# Patient Record
Sex: Male | Born: 1965
Health system: Southern US, Community
[De-identification: ages and names within clinical notes are randomized; demographics above are authoritative.]

## PROBLEM LIST (undated history)

## (undated) DIAGNOSIS — K4090 Unilateral inguinal hernia, without obstruction or gangrene, not specified as recurrent: Secondary | ICD-10-CM

## (undated) DIAGNOSIS — K219 Gastro-esophageal reflux disease without esophagitis: Secondary | ICD-10-CM

## (undated) DIAGNOSIS — G8929 Other chronic pain: Secondary | ICD-10-CM

## (undated) DIAGNOSIS — F1111 Opioid abuse, in remission: Secondary | ICD-10-CM

## (undated) DIAGNOSIS — Z8619 Personal history of other infectious and parasitic diseases: Secondary | ICD-10-CM

## (undated) DIAGNOSIS — J449 Chronic obstructive pulmonary disease, unspecified: Secondary | ICD-10-CM

## (undated) DIAGNOSIS — G4733 Obstructive sleep apnea (adult) (pediatric): Secondary | ICD-10-CM

## (undated) HISTORY — PX: CARDIAC CATHETERIZATION: SHX172

---

## 1997-08-04 ENCOUNTER — Emergency Department (HOSPITAL_COMMUNITY): Admission: EM | Admit: 1997-08-04 | Discharge: 1997-08-04 | Payer: Self-pay | Admitting: Emergency Medicine

## 1997-08-15 ENCOUNTER — Emergency Department (HOSPITAL_COMMUNITY): Admission: EM | Admit: 1997-08-15 | Discharge: 1997-08-15 | Payer: Self-pay | Admitting: Emergency Medicine

## 1997-09-01 ENCOUNTER — Emergency Department (HOSPITAL_COMMUNITY): Admission: EM | Admit: 1997-09-01 | Discharge: 1997-09-01 | Payer: Self-pay | Admitting: Emergency Medicine

## 1997-09-06 ENCOUNTER — Emergency Department (HOSPITAL_COMMUNITY): Admission: EM | Admit: 1997-09-06 | Discharge: 1997-09-06 | Payer: Self-pay | Admitting: Emergency Medicine

## 1997-10-31 ENCOUNTER — Emergency Department (HOSPITAL_COMMUNITY): Admission: EM | Admit: 1997-10-31 | Discharge: 1997-10-31 | Payer: Self-pay | Admitting: Emergency Medicine

## 1998-06-29 ENCOUNTER — Emergency Department (HOSPITAL_COMMUNITY): Admission: EM | Admit: 1998-06-29 | Discharge: 1998-06-29 | Payer: Self-pay | Admitting: Emergency Medicine

## 1999-11-02 ENCOUNTER — Emergency Department (HOSPITAL_COMMUNITY): Admission: EM | Admit: 1999-11-02 | Discharge: 1999-11-02 | Payer: Self-pay | Admitting: Emergency Medicine

## 2000-06-27 ENCOUNTER — Encounter: Admission: RE | Admit: 2000-06-27 | Discharge: 2000-09-25 | Payer: Self-pay | Admitting: Anesthesiology

## 2002-01-31 ENCOUNTER — Emergency Department (HOSPITAL_COMMUNITY): Admission: EM | Admit: 2002-01-31 | Discharge: 2002-01-31 | Payer: Self-pay | Admitting: Emergency Medicine

## 2003-06-05 ENCOUNTER — Emergency Department (HOSPITAL_COMMUNITY): Admission: EM | Admit: 2003-06-05 | Discharge: 2003-06-05 | Payer: Self-pay | Admitting: Emergency Medicine

## 2005-02-19 ENCOUNTER — Emergency Department (HOSPITAL_COMMUNITY): Admission: EM | Admit: 2005-02-19 | Discharge: 2005-02-19 | Payer: Self-pay

## 2005-04-19 ENCOUNTER — Emergency Department (HOSPITAL_COMMUNITY): Admission: EM | Admit: 2005-04-19 | Discharge: 2005-04-19 | Payer: Self-pay | Admitting: Emergency Medicine

## 2006-08-05 ENCOUNTER — Ambulatory Visit: Payer: Self-pay | Admitting: Cardiology

## 2006-08-05 ENCOUNTER — Inpatient Hospital Stay (HOSPITAL_COMMUNITY): Admission: EM | Admit: 2006-08-05 | Discharge: 2006-08-11 | Payer: Self-pay | Admitting: Emergency Medicine

## 2006-08-07 ENCOUNTER — Encounter (INDEPENDENT_AMBULATORY_CARE_PROVIDER_SITE_OTHER): Payer: Self-pay | Admitting: Radiation Oncology

## 2006-08-07 HISTORY — PX: TRANSTHORACIC ECHOCARDIOGRAM: SHX275

## 2007-11-29 ENCOUNTER — Emergency Department (HOSPITAL_COMMUNITY): Admission: EM | Admit: 2007-11-29 | Discharge: 2007-11-29 | Payer: Self-pay | Admitting: Family Medicine

## 2008-01-08 ENCOUNTER — Emergency Department (HOSPITAL_COMMUNITY): Admission: EM | Admit: 2008-01-08 | Discharge: 2008-01-08 | Payer: Self-pay | Admitting: Emergency Medicine

## 2009-03-06 ENCOUNTER — Emergency Department (HOSPITAL_COMMUNITY): Admission: EM | Admit: 2009-03-06 | Discharge: 2009-03-06 | Payer: Self-pay | Admitting: Family Medicine

## 2010-07-17 NOTE — Cardiovascular Report (Signed)
NAME:  Clarence Murray, Clarence Murray NO.:  [id number]   MEDICAL RECORD NO.:  [id number]          PATIENT TYPE:  OUT   LOCATION:  CATH                         FACILITY:  [facility]   PHYSICIAN:  Veverly Fells. Excell Seltzer, MD  DATE OF BIRTH:  07/24/1965   DATE OF PROCEDURE:  08/11/2006  DATE OF DISCHARGE:                            CARDIAC CATHETERIZATION   PROCEDURE:  Left heart catheterization, selective coronary angiography,  left ventricular angiography, and StarClose of the right femoral artery.   INDICATIONS:  Clarence Murray is a 45 year old gentleman with chest pain and  multiple cardiovascular risk factors.  He has had intermittent pain  throughout his hospitalization and is referred for cardiac  catheterization in the setting of his high risk for coronary artery  disease.   Risks and indications of the procedure were explained to the patient in  detail.  Informed consent was obtained.  The right groin was prepped,  draped and anesthetized with 1% lidocaine.  Using the modified Seldinger  technique, a 6-French sheath was placed in the right femoral artery.  Multiple views of the left and right coronary arteries were taken using  standard Judkins catheters.  Following selective coronary angiography,  an angled pigtail catheter was inserted into the left ventricle and  pressures were recorded.  A left ventriculogram was performed.  A  pullback across the aortic valve was done.  At the conclusion of the  procedure the arteriotomy was sealed with a StarClose device.   FINDINGS:  Aortic pressure 116/73 with a mean of 92, left ventricular  pressure 116/7 with an end-diastolic pressure of 9.   The left mainstem is angiographically normal.  It bifurcates into the  LAD and left circumflex.   The LAD is a large-caliber vessel that courses down and reaches the left  ventricular apex.  The vessel actually wraps around the LV apex and  supplies the distal portion of the inferoapex.  The LAD in  its proximal  portion has nonobstructive plaque in the range of 20-30%.  The mid  portion of the vessel gives off a first diagonal branch that is medium  size.  The remaining portions of the mid and distal LAD have no  significant angiographic disease.   The left circumflex is a large-caliber vessel.  It is a dominant vessel,  providing a left PDA.  It provides a large first OM branch.  It then  courses down and provides a twin left PDA system as well as a left  posterolateral branch.  The proximal portion of the left circumflex has  20-30% irregular stenosis that is clearly nonobstructive.  There is no  other angiographic disease seen in the left circumflex system.   The right coronary artery is small and nondominant.  There is no  significant angiographic disease.   Left ventricular function assessed with 30-degree right anterior oblique  left ventriculography demonstrates normal LVEF with no regional wall  motion abnormalities.  The LVEF is estimated at 65%.  There is no mitral  regurgitation.   ASSESSMENT:  1. Nonobstructive coronary artery disease.  2. Minor nonobstructive plaque in the left  anterior descending artery      and left circumflex.  3. Normal right coronary artery (nondominant vessel).  4. Normal left ventricular function.   PLAN:  Recommend medical therapy for the patient's nonobstructive  coronary artery disease.  His LDL goal should be less than 100 and  smoking cessation will be advised.      Veverly Fells. Excell Seltzer, MD  Electronically Signed     MDC/MEDQ  D:  08/11/2006  T:  08/11/2006  Job:  [id number]

## 2010-07-17 NOTE — H&P (Signed)
NAME:  PARISHJadd, Gasior NO.:  [id number]   MEDICAL RECORD NO.:  [id number]          PATIENT TYPE:  EMS   LOCATION:  ED                           FACILITY:  [facility]   PHYSICIAN:  Hillery Aldo, M.D.   DATE OF BIRTH:  03/24/1965   DATE OF ADMISSION:  08/05/2006  DATE OF DISCHARGE:                              HISTORY & PHYSICAL   PRIMARY CARE PHYSICIAN:  None.   CHIEF COMPLAINT:  Progressive dyspnea and worsening flu symptoms.   HISTORY OF PRESENT ILLNESS:  The patient is a 45 year old male with past  medical history of tobacco abuse who presents with a 5-day history of  flu-like symptoms.  The patient states that his symptoms began with  fever, congestion, cough, and he has experienced progressive dyspnea  since becoming ill.  His cough is not productive of yellow to green  sputum.  His appetite is diminished.  He has fever interspersed with  episodes of diaphoresis.  He has not had any sick contacts that he knows  of.  On initial evaluation in the emergency department, he is found to  be hypoxic and is therefore being admitted for further evaluation and  treatment.   PAST MEDICAL HISTORY:  1. Herniated nucleus pulposus C5-C6 status post cervical steroid      injections.  2. Traumatic injury to the left zygomatic arches at age 38, status      post surgical repair.   FAMILY HISTORY:  The patient's father died at 32 from a massive heart  attack.  The patient's mother died at 38 from brain cancer.  He has one  brother who is deceased from a motor vehicle accident and another sister  who died at age 80 from acute MI.  One brother is alive but has lung  disease.   SOCIAL HISTORY:  The patient is married and lives with his wife in  [place].  He has a history of heavy tobacco use, up to two packs per  day, quit approximately two weeks ago.  Denies any alcohol or drug use  but has used drugs in the past.  He is currently on methadone  maintenance through [facility], and  has been on methadone for the past 10  years.  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   ALLERGIES:  None.   CURRENT MEDICATIONS:  Methadone 105 mg daily.   REVIEW OF SYSTEMS:  As noted in the elements of the HPI above.  The  patient denies any nausea or vomiting.  He has had some pleuritic chest  pain intermittently.  No changes in his bowel habits, no melena or  hematochezia.  No dysuria.   PHYSICAL EXAMINATION:  VITAL SIGNS:  Temperature 101.4, pulse 89,  respirations 22, blood pressure 120/72, O2 saturation 91% on 2 liters.  GENERAL:  This is a well-developed, well-nourished male who is in mild  respiratory distress.  HEENT:  Normocephalic, atraumatic.  PERRL.  EOMI.  Oropharynx reveals tonsillar exudates and his oropharynx reveals  extremely poor dentition.  NECK:  Supple, no thyromegaly, no lymphadenopathy, no jugular venous  distension.  CHEST:  Diffuse expiratory  wheezes throughout.  HEART:  Regular rate, rhythm.  No murmurs, rubs, gallops.  ABDOMEN:  Soft, nontender, nondistended with normoactive bowel sounds.  EXTREMITIES:  No clubbing, edema, cyanosis.  SKIN:  Warm and dry.  No rashes.  NEUROLOGIC:  Nonfocal.   DATA REVIEW.:  Chest x-ray reveals chronic interstitial lung disease  without acute cardiopulmonary findings.   No laboratory data was obtained by the ED physicians.  I have ordered a  CBC, basic metabolic panel and arterial blood gas.   ASSESSMENT/PLAN:  1. Bronchitis with bronchospasm:  We will admit the patient for IV      antibiotic therapy.  I will obtain cultures of his sputum and      blood.  Will start him on IV Avelox and administer Solu-Medrol for      his bronchospasm as well as nebulized bronchodilator therapy.  Will      administer Mucinex and Robitussin DM for ankylosis.  We will taper      steroids as tolerated.  2. Methadone maintenance:  Continue the patient's usual dose of      methadone.  3. Prophylaxis:  Will initiate GI and DVT prophylaxis  with Protonix      and Lovenox.      Hillery Aldo, M.D.  Electronically Signed     CR/MEDQ  D:  08/05/2006  T:  08/05/2006  Job:  [id number]

## 2010-07-17 NOTE — Consult Note (Signed)
NAME:  PARISHJacquese, Murray NO.:  [id number]   MEDICAL RECORD NO.:  [id number]          PATIENT TYPE:  INP   LOCATION:  1438                         [facility]AC[facility]:  [facility]   PHYSICIAN:  Arturo Morton. Riley Kill, MD, FACCDATE OF BIRTH:  [birth year]   DATE OF CONSULTATION:  08/06/2006  DATE OF DISCHARGE:                                 CONSULTATION   CHIEF COMPLAINT:  Chest pain.   HISTORY OF PRESENT ILLNESS:  This gentleman is a very pleasant 45-year-  old gentleman who presents with a past medical history of heavy tobacco  use.  He presents with a 5-day history of flu-like symptoms.  He began  with the fever, cough and congestion and he has had increasing dyspnea.  His cough has not been particularly productive.  In the emergency room  he was found to have hypoxemia, and also known to have temperature.  The  patient's brother has a history of rather marked bronchitis and COPD and  I spoke with him directly.   PAST MEDICAL HISTORY:  The patient has had a herniated nucleus pulposus  at C5-C6 with cervical steroid injections.  He had traumatic injury to  the left zygomatic arches at age 14 and has had repair.  The patient  also takes methadone.   ALLERGIES:  HE HAS NO ALLERGIES.   MEDICATIONS:  Include methadone daily.   FAMILY HISTORY:  The patient's father died at 76 of a heart attack.  His  mother died at 50 from brain cancer.  He has a brother who died in a  motor vehicle accident, a sister who died with an acute MI at 15.  His  brother has had cardiac catheterization, and this does not demonstrate  critical disease according to the brother.  He does have COPD.   SOCIAL HISTORY:  The patient is married and lives with his wife in  [place].  He has smoked heavily for a long time but quit a couple of  weeks ago.  He denies alcohol and drug use but apparently used drugs in  the past.  He is currently on a methadone maintenance through [facility] and  has been on methadone for the  past 10 years.  He works part-time in  delivery.   REVIEW OF SYSTEMS:  The patient has had some chest discomfort associated  with exertion, but has also had chest pain at rest.  There has been a  somewhat progressive pattern.  He denies nausea or vomiting.  He has not  had blood in the stools.  He has not had significant weight gain or  loss.  He has not had specific tenderness in the chest.   PHYSICAL EXAMINATION:  He is alert and oriented.  His temperature is  98.1.  The pulse is 80.  The respiratory rate is 20 and the blood  pressure 118/60.  He appears slightly diaphoretic.  HEENT:  The pupils are equal and reactive to light.  The sclerae are  clear.  NECK:  The carotid upstrokes are brisk. There are no obvious carotid  bruits.  LUNGS:  Clear to percussion, but  on auscultation there is fairly marked  decrease in breath sounds overall with prolonged expiration and slight  wheezes.  The PMI is nondisplaced and the heart sounds are somewhat  distant with normal first and second heart sounds.  No murmurs, rubs or  gallops.  EXTREMITIES:  Reveal no edema.  NEUROLOGIC:  Reveals no acute findings.   Chest x-ray reveals clear lung fields without focal infiltrates or  edema.  There are coarse interstitial markings and infrahilar  prominence.  Some scar is seen at the left lung base.  There is mild  compression deformity of two mid thoracic vertebral bodies.  Previous x-  rays revealed peribronchial thickening and diffuse interstitial disease.   IMPRESSION:  1. Acute bronchitis with fever and hypoxemia.  2. Longstanding tobacco use.  3. Leukocytosis.  4. Multiple risk factors for coronary artery disease with strong      positive family history, smoking and very poor dentition.  5. History of hypokalemia, now replaced, question etiology.   RECOMMENDATIONS:  1. I would recommend that the patient have a two-dimensional      echocardiogram.  2. I have had an extensive discussion with  his family.  I have      encouraged him to have his teeth taking care of.  His wife has      attempted to do this.  I have strongly encouraged him to stop      smoking.  With regard to his current symptoms and most bothered by      the progressive nature of the symptoms over the last year and the      strong family history.  Importantly, the patient is not a very good      candidate presently for exercise tolerance testing.  He has an      extensive cough.   I would likely recommend doing a 2-D echo to assess LV function and  considering cardiac catheterization just before discharge.  We will  follow the patient with you.  I appreciate the consultation.      Arturo Morton. Riley Kill, MD, [facility]  Electronically Signed     TDS/MEDQ  D:  08/06/2006  T:  08/07/2006  Job:  [id number]

## 2010-07-20 NOTE — H&P (Signed)
[facility]  Patient:    Clarence Murray, Clarence Murray                      MRN: [id number] Adm. Date:  [id number] Attending:  Thyra Breed CC:         Jearld Adjutant, M.D.   History and Physical  FOLLOWUP EVALUATION:  [name] called earlier today saying that he is worried he has an infection from his cervical epidural steroid injection site.  He is seen today.  He describes no recent fevers or sweats.  He states that he was doing well following the injection, which was on Jul 08, 2000, and did well up until yesterday, when he began to have a bit of discomfort at the base of the spine.  Today, he is exquisitely tender over the base of the spine above the area where the injection site is.  He denied any fever or sweats.  he denies any new neurologic symptoms.  PHYSICAL EXAMINATION:  VITAL SIGNS:  Blood pressure 153/89, heart rate is 83, respiratory rate is 18, O2 saturation is 97%, pain level is 10, according to him, and temperature is 97.4.  NECK:  He exhibits no erythema at the injection site.  No lymphadenopathy of the neck.  NEUROLOGIC:  His neuro exam is grossly unchanged from his last visit.  He is tender above the injection site over the spinous process of C6 and to the left.  IMPRESSION:  Neck discomfort which may or may note be related to the injection, since it is occurring nine days afterwards, which temporally seems unlikely.  There is no evidence of infection grossly on physical exam and no evidence of fever.  DISPOSITION: 1. Stop hydrocodone and take Percocet 1 to 1 p.o. q.6h. p.r.n., #100 with no    refill. 2. He tells me he say Dr. Jearld Adjutant yesterday and was examined and is in    the process of being reevaluated for possible surgical intervention with    Dr. [name]. [name].  I encouraged him to follow up closely with him.  He    was encouraged to call should he develop any evidence of infection.  At    this point, it looks like he has  increased neck discomfort which may be    related to underlying exacerbation of his existing disease.  It is nine    days since he had the injection and he did well following the injection    until recently.  I advised him it would be unusual for an infection to be    so long afterwards but it is not entirely out of the realm so we should    keep an eye just to make sure that nothing pops up and he promised he would    call if he developed any problems further along the way. DD:  07/17/00 TD:  07/18/00 Job: [id number]

## 2010-07-20 NOTE — Op Note (Signed)
[facility]  Patient:    Clarence Murray, Clarence Murray                      MRN: [id number] Proc. Date: 06/30/00 Adm. Date:  [id number] Attending:  Thyra Murray CC:         Clarence Murray, M.D.  Workers Comp   Operative Report  PROCEDURE:  Cervical epidural steroid injection.  DIAGNOSIS:  Annular tear of the C5-6 disk with degenerative changes with history of work-related injury in February 2002.  ANESTHESIOLOGIST:  Clarence Murray, M.D.  HISTORY: Clarence Murray is a very pleasant 45 year old who was sent to [place] by Dr. Renae Murray for a cervical epidural steroid injection.  The patient was in his usual state of health up until a work-related injury in early February.  He estimates it to be April 05, 2000, when he felt a pop in his neck.  He was initially seen by Dr. Louanna Murray and treated with Vioxx and Zanaflex which resulted in mild improvement, and he sent him to Dr. Cheral Murray, a chiropractor, who did chiropractic treatments.  He did not improved, and an MRI was obtained on May 11, 2000, which demonstrated C5-6 degeneration of the disk to a mild extend with protrusion more prominent on the left and a small annular tear. He was seen by Dr. Renae Murray on June 06, 2000, at which time he was continued on Zanaflex, Vioxx, and given Vicodin as well as a steroid dosepak for six days. He noted mild improvement with the dosepak.  He was also placed on Zyban and has been able to cut his cigarette consumption in half.  Currently he complains of a deep stinging pain in his neck which radiates predominantly out to the right upper extremity with associated numbness and tingling intermittently down to the second to fifth fingers which is brought on especially at night with certain movements of the head.  It is increased by cough, lifting objects, and improved by pulling the neck up.  He has not had cervical traction, although he has discussed this with Dr. Renae Murray and Dr. Cheral Murray.  He  denies weakness or bowel or bladder incontinence.  CURRENT MEDICATIONS:  Zyban, Zanaflex 2 mg twice a day, hydrocodone up to 4 tablets a day, and Vioxx which causes some nausea.  ALLERGIES:  No known drug allergies.  FAMILY HISTORY:  Positive for coronary artery disease, hypertension, asthma, and COPD.  PAST MEDICAL HISTORY:  Significant for history of Rocky [place] spotted fever at age 57 and dorsal kyphosis since teenage years.  PAST SURGICAL HISTORY:  The patient has had his wisdom teeth extracted, otherwise negative.  SOCIAL HISTORY:  The patient works at [employer] as a Curator.  He smokes one pack of cigarettes per day at present.  He does not drink alcohol.  REVIEW OF SYSTEMS:  General: Significant for sweats but no changes in weight or fevers.   Head: Significant for left-sided headaches which date back three to four months which he described as more like a migraine-type headache. Eyes: Negative.  Nose, Mouth, Throat: Negative.  Ears: Significant for perforated ear drum on the left side.  Cardiovascular: negative.  Pulmonary: Negative.  GI: Significant for indigestion with certain foods.  GU: Negative. Musculoskeletal and Neurologic : See HPI.  No history of seizure or stroke. Cutaneous:  Negative. Hematologic: Negative.  Endocrine: Negative. Psychiatric: Positive for anxiety for which he was taking medicine up to two years ago.  His mother has schizophrenia, and he  was raised by his grandmother.   Allergy and Immunologic: Negative.  PHYSICAL EXAMINATION:  VITAL SIGNS:  Blood pressure 122/70, heart rate 77, respiratory rate 20, O2 saturation 95%, temperature 97.2.  GENERAL:  This is a pleasant, anxious male in no acute distress. HEENT:  Head was normocephalic, atraumatic.  Eyes: Extraocular movements intact with conjunctivae and sclerae clear.  Nose: Septal deviation to the left with patent nares.  Oropharynx free of lesions. NECK:  Demonstrated intact extension,  forward flexion to about 20 to 30 degrees, lateral flexion to the right to about 30 degrees, to the left about 30 to 40 degrees.  Rotation was relatively intact.  He had negative Spurlings signs.  Carotids were 2+ and symmetric without bruits.  There was no lymphadenopathy.  HEART:  Regular rate and rhythm.  LUNGS:  Clear.  He did have accentuated dorsal kyphosis.  ABDOMEN/GENITALIA/RECTAL:  Exams not performed.  BACK:  Kyphosis from the dorsal spine with negative straight leg raise signs.  EXTREMITIES:  No cyanosis, clubbing, or edema.  Radial pulses and dorsalis pedis pulses 2+ and symmetric.  He did have curved second toes of his feet bilaterally.  NEUROLOGIC:  The patient is oriented x 4.  Cranial nerves II-XII grossly intact.  Deep tendon reflexes were symmetric in the upper and lower extremities with downgoing toes.  Motor was 5/5 with symmetric bulk and tone. Sensory was intact to vibratory sense and pinprick. Coordination was grossly intact.  IMPRESSION: 1. Neck pain with intermittent radiation to right upper extremity with    associated numbness and tingling with underlying annular tear at C5-6. 2. Dorsal kyphosis which is probably Scheuermanns disease. 3. 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 4. Mild indigestion.  DISPOSITION:  I discussed with the patient and his wife the potential risks, benefits, and limitations of an epidural steroid injection.   Their questions were answered.  PROCEDURE:  After informed consent was obtained, the patient was placed in the sitting position with his neck flexed forward.  I identified the C7-T1 interspace and marked this area.  The area was prepped with Betadine x 3.  I anesthetized the interspace with 1% lidocaine using a 25-gauge needle.  A 20-gauge Tuohy needle was introduced to the cervical epidural space to loss of  resistance to preservative free normal saline.  There was no CSF nor blood. Medrol 40 mg and 3 ml  preservative free normal saline was gently injected. The needle was flushed with preservative free normal saline and removed intact.  POSTPROCEDURE CONDITION:  Stable.  DISCHARGE INSTRUCTIONS: 1. Resume previous diet. 2. Limitation of activities per instruction sheet. 3. Continue on current medications. 4. The patient was advised that he should contact us if he has a partial    response to the injection within a week, and we would consider repeating    the injection.  If he has a good response, he does not need any further    injections for at least six months.  If he has no response, there is really    no need for a second injection. DD:  06/30/00 TD:  06/30/00 Job: [id number]

## 2010-07-20 NOTE — Procedures (Signed)
[facility]  Patient:    Clarence Murray, Clarence Murray                      MRN: [id number] Proc. Date: 07/08/00 Adm. Date:  [id number] Attending:  Thyra Breed CC:         Jearld Adjutant, M.D.  Workers Compensation   Procedure Report  PROCEDURE:  Cervical epidural steroid injection.  DIAGNOSIS:  Annular tear at C5-6 on the basis of a work-related injury.  INTERVAL HISTORY:  The patient has noted initial marked improvement after his injection.  Unfortunately, by about day four after the injection, he woke up, and he noted that he had left-sided neck discomfort which has persisted.  He has no new neurologic symptoms other than the neck pain.  He described as a burning-type discomfort which is fairly well localized with some localized sense of numbness over the skin.  He has not had any fevers, chills, or sweats.  PHYSICAL EXAMINATION:  Blood pressure 114/62, heart rate 74, respiratory rate 18, O2 saturations 95%.  Temperature is 97.5.  Pain level is 7/10.  He shows good healing from his previous injection site.  He is actually tender above the injection area.  Deep tendon reflexes are symmetric in the upper extremities.  Motor is 5/5.  He has a tendency to hold his left shoulder higher than his right.  DESCRIPTION OF PROCEDURE:  After informed consent was obtained, the patient was placed in the sitting position with his neck flexed forward.  I identified the C7-T1 interspace and marked the area.  The area was prepped with Betadine x 3 and draped.  I anesthetized the interspace with 1% lidocaine using a 25 gauge needle.  A 20 gauge Tuohy needle was introduced to the cervical epidural space to loss of resistance to preservative-free normal saline.  There was no CSF nor blood.  Medrol 40 mg in 3 mL of preservative-free normal saline was gently injected.  The needle was flushed with preservative-free normal saline and removed intact.  Postprocedure condition -  stable.  DISCHARGE INSTRUCTIONS: 1. Resume previous diet. 2. Limitations on activities per instruction sheet, as outlined by my    assistant today.  His wife is his driver. 3. Continue on current medications. 4. I advised the patient that it is unlikely that he will need another    injection, as if this one does not make any significant improvement, there    is no need for further injections. DD:  07/08/00 TD:  07/08/00 Job: [id number]

## 2010-07-20 NOTE — Discharge Summary (Signed)
NAME:  Clarence Murray, Clarence Murray NO.:  [id number]   MEDICAL RECORD NO.:  [id number]          PATIENT TYPE:  INP   LOCATION:  1438                         FACILITY:  [facility]   PHYSICIAN:  Lonia Blood, M.D.       DATE OF BIRTH:  03-01-1966   DATE OF ADMISSION:  08/04/2006  DATE OF DISCHARGE:  08/11/2006                               DISCHARGE SUMMARY   DISCHARGE DIAGNOSIS:  1. Chronic obstructive pulmonary disease with acute exacerbation.  2. Chest pain status post cardiac catheterization negative for      coronary artery disease.  3. Hypokalemia resolved.  4. Chronic pain syndrome on methadone.  5. Recent tobacco abuse.  6. Acute bronchitis.  7. Hypoxia - resolved.   DISCHARGE MEDICATIONS:  1. Combivent 1 puff three times a day.  2. Prednisone 20 mg taper.  3. Methadone 105 mg as before.  4. Aspirin 81 mg daily.  5. Doxycycline 100 mg daily for 5 days.   CONDITION ON DISCHARGE:  The patient was discharged in good condition.  At the time of discharge the patient was afebrile, with stable vital  signs. His oxygenation has improved.  The patient was instructed to  follow up with [facility] and to call [phone] for an  eligibility appointment.  Also the patient will follow up with Dr.  Excell Seltzer from cardiology as needed.   PROCEDURES DURING THIS ADMISSION:  1. On August 11, 2006 the patient underwent transthoracic echocardiogram      findings of left ventricular systolic function normal 60%.  2. August 11, 2006 coronary angiogram, left heart angiogram with left      heart cardiac catheterization, findings of normal coronary      arteries.  3. August 08, 2006 pulmonary function test with findings of severe      obstructive lung defect with mild restrictive lung defect, mild      response to bronchodilator, measured FEV-1 57%, measure FEV-1/FVC      31%, DLCO was 41%, vital capacity 57%.   CONSULTATION:  During this admission the patient was seen in  consultation  by the [facility].   HISTORY AND PHYSICAL:  For admission history and physical refer to  dictated H&P done by Dr. Hillery Aldo.   HOSPITAL COURSE.:  Problem 1.  Hypoxia and wheezing. The patient was  admitted with an episode of probable interstitial pneumonia that was  causing a COPD exacerbation.  The patient was started on high-dose  steroids, nebulizer treatments and oxygen.  He had a course of  progressive improvement.  Gradually steroids were weaned off towards  oral prednisone and the patient's nebulizer were switched to MDIs.  The  patient was extensively educated about the importance of smoking  cessation and to have outpatient follow-up.   Problem 2.  Chest pain.  [name] started complaining of chest pain  during this admission.  He also related that at times he had episodes of  true angina at home.  He was seen in consultation by Dr. Riley Kill who  recommended a cardiac catheterization.  [name] cardiac  catheterization  was within normal limits.  The patient was instructed to  continue smoking cessation and to engage in a healthy lifestyle, to  increase his exercise ability as his lung condition will allow.      Lonia Blood, M.D.  Electronically Signed     SL/MEDQ  D:  08/20/2006  T:  08/20/2006  Job:  [id number]

## 2010-12-04 LAB — POCT I-STAT, CHEM 8
Calcium, Ion: 1.14 mmol/L (ref 1.12–1.32)
Chloride: 99 mEq/L (ref 96–112)
Glucose, Bld: 101 mg/dL — ABNORMAL HIGH (ref 70–99)
HCT: 47 % (ref 39.0–52.0)
TCO2: 31 mmol/L (ref 0–100)

## 2010-12-04 LAB — CBC
HCT: 43.7 % (ref 39.0–52.0)
Hemoglobin: 14.7 g/dL (ref 13.0–17.0)
MCHC: 33.6 g/dL (ref 30.0–36.0)
MCV: 95.1 fL (ref 78.0–100.0)
Platelets: 267 10*3/uL (ref 150–400)
RBC: 4.6 MIL/uL (ref 4.22–5.81)
RDW: 13.8 % (ref 11.5–15.5)
WBC: 6.2 10*3/uL (ref 4.0–10.5)

## 2010-12-04 LAB — POCT URINALYSIS DIP (DEVICE)
Glucose, UA: NEGATIVE mg/dL
Hgb urine dipstick: NEGATIVE
Nitrite: NEGATIVE
Operator id: 239701
Protein, ur: 100 mg/dL — AB
Specific Gravity, Urine: >=1.03 (ref 1.005–1.030)
Urobilinogen, UA: 1 mg/dL (ref 0.0–1.0)
pH: 5.5 (ref 5.0–8.0)

## 2010-12-04 LAB — DIFFERENTIAL
Basophils Absolute: 0 10*3/uL (ref 0.0–0.1)
Eosinophils Absolute: 0.1 10*3/uL (ref 0.0–0.7)
Eosinophils Relative: 1 % (ref 0–5)
Lymphocytes Relative: 34 % (ref 12–46)
Neutrophils Relative %: 56 % (ref 43–77)

## 2010-12-20 LAB — CBC
HCT: 33.1 — ABNORMAL LOW
Hemoglobin: 11.4 — ABNORMAL LOW
Hemoglobin: 12.5 — ABNORMAL LOW
MCHC: 33.3
MCHC: 34.5
MCV: 87.7
MCV: 92
MCV: 92.8
RBC: 3.59 — ABNORMAL LOW
RBC: 3.97 — ABNORMAL LOW
RDW: 14.8 — ABNORMAL HIGH
WBC: 11.1 — ABNORMAL HIGH

## 2010-12-20 LAB — DIFFERENTIAL
Eosinophils Absolute: 0
Lymphs Abs: 1.2
Monocytes Relative: 4
Neutrophils Relative %: 86 — ABNORMAL HIGH

## 2010-12-20 LAB — EXPECTORATED SPUTUM ASSESSMENT W GRAM STAIN, RFLX TO RESP C

## 2010-12-20 LAB — BASIC METABOLIC PANEL
CO2: 29
CO2: 30
CO2: 38 — ABNORMAL HIGH
Calcium: 8.6
Calcium: 8.8
Calcium: 9.2
Chloride: 92 — ABNORMAL LOW
Chloride: 97
Creatinine, Ser: 0.58
Creatinine, Ser: 0.68
Creatinine, Ser: 0.79
Creatinine, Ser: 0.93
GFR calc Af Amer: >60
GFR calc Af Amer: >60
GFR calc Af Amer: >60
GFR calc non Af Amer: >60
Glucose, Bld: 104 — ABNORMAL HIGH
Glucose, Bld: 138 — ABNORMAL HIGH
Glucose, Bld: 171 — ABNORMAL HIGH
Potassium: 2.8 — ABNORMAL LOW
Sodium: 133 — ABNORMAL LOW
Sodium: 136

## 2010-12-20 LAB — PROTIME-INR: INR: 1

## 2010-12-20 LAB — CULTURE, BLOOD (ROUTINE X 2): Culture: NO GROWTH

## 2010-12-20 LAB — CARDIAC PANEL(CRET KIN+CKTOT+MB+TROPI)
Relative Index: INVALID
Troponin I: 0.02

## 2010-12-20 LAB — BLOOD GAS, ARTERIAL
Acid-Base Excess: 3.7 — ABNORMAL HIGH
O2 Content: 2
O2 Saturation: 89.9
Patient temperature: 97.8

## 2010-12-20 LAB — CULTURE, RESPIRATORY W GRAM STAIN

## 2010-12-20 LAB — APTT: aPTT: 25

## 2014-03-16 ENCOUNTER — Encounter (HOSPITAL_COMMUNITY): Payer: Self-pay | Admitting: *Deleted

## 2014-03-16 ENCOUNTER — Inpatient Hospital Stay (HOSPITAL_COMMUNITY)
Admission: EM | Admit: 2014-03-16 | Discharge: 2014-03-19 | DRG: 190 | Disposition: A | Discharge status: Home / Self Care | Payer: 59 | Attending: Internal Medicine | Admitting: Internal Medicine

## 2014-03-16 ENCOUNTER — Emergency Department (HOSPITAL_COMMUNITY): Payer: 59

## 2014-03-16 DIAGNOSIS — Z7951 Long term (current) use of inhaled steroids: Secondary | ICD-10-CM | POA: Diagnosis not present

## 2014-03-16 DIAGNOSIS — J9620 Acute and chronic respiratory failure, unspecified whether with hypoxia or hypercapnia: Secondary | ICD-10-CM

## 2014-03-16 DIAGNOSIS — J449 Chronic obstructive pulmonary disease, unspecified: Secondary | ICD-10-CM | POA: Diagnosis present

## 2014-03-16 DIAGNOSIS — J9621 Acute and chronic respiratory failure with hypoxia: Secondary | ICD-10-CM | POA: Diagnosis present

## 2014-03-16 DIAGNOSIS — Z79891 Long term (current) use of opiate analgesic: Secondary | ICD-10-CM | POA: Diagnosis not present

## 2014-03-16 DIAGNOSIS — J9622 Acute and chronic respiratory failure with hypercapnia: Secondary | ICD-10-CM | POA: Diagnosis present

## 2014-03-16 DIAGNOSIS — F17210 Nicotine dependence, cigarettes, uncomplicated: Secondary | ICD-10-CM | POA: Diagnosis present

## 2014-03-16 DIAGNOSIS — R0902 Hypoxemia: Secondary | ICD-10-CM

## 2014-03-16 DIAGNOSIS — R0602 Shortness of breath: Secondary | ICD-10-CM

## 2014-03-16 DIAGNOSIS — K219 Gastro-esophageal reflux disease without esophagitis: Secondary | ICD-10-CM | POA: Diagnosis present

## 2014-03-16 DIAGNOSIS — E872 Acidosis: Secondary | ICD-10-CM | POA: Diagnosis present

## 2014-03-16 DIAGNOSIS — Z79899 Other long term (current) drug therapy: Secondary | ICD-10-CM

## 2014-03-16 DIAGNOSIS — G8929 Other chronic pain: Secondary | ICD-10-CM | POA: Diagnosis present

## 2014-03-16 DIAGNOSIS — E871 Hypo-osmolality and hyponatremia: Secondary | ICD-10-CM | POA: Diagnosis present

## 2014-03-16 DIAGNOSIS — J441 Chronic obstructive pulmonary disease with (acute) exacerbation: Secondary | ICD-10-CM | POA: Diagnosis not present

## 2014-03-16 DIAGNOSIS — J189 Pneumonia, unspecified organism: Secondary | ICD-10-CM | POA: Diagnosis present

## 2014-03-16 HISTORY — DX: Other chronic pain: G89.29

## 2014-03-16 HISTORY — DX: Chronic obstructive pulmonary disease, unspecified: J44.9

## 2014-03-16 LAB — I-STAT TROPONIN, ED: Troponin i, poc: 0 ng/mL (ref 0.00–0.08)

## 2014-03-16 LAB — POCT I-STAT 3, ART BLOOD GAS (G3+)
ACID-BASE EXCESS: 4 mmol/L — AB (ref 0.0–2.0)
Bicarbonate: 36.1 mEq/L — ABNORMAL HIGH (ref 20.0–24.0)
O2 Saturation: 94 %
PO2 ART: 94 mmHg (ref 80.0–100.0)
Patient temperature: 98.2
TCO2: 39 mmol/L (ref 0–100)
pCO2 arterial: 95 mmHg (ref 35.0–45.0)
pH, Arterial: 7.186 — CL (ref 7.350–7.450)

## 2014-03-16 LAB — BRAIN NATRIURETIC PEPTIDE: B Natriuretic Peptide: 34.8 pg/mL (ref 0.0–100.0)

## 2014-03-16 LAB — COMPREHENSIVE METABOLIC PANEL
ALBUMIN: 4 g/dL (ref 3.5–5.2)
ALT: 11 U/L (ref 0–53)
AST: 16 U/L (ref 0–37)
Alkaline Phosphatase: 69 U/L (ref 39–117)
Anion gap: 13 (ref 5–15)
BUN: 13 mg/dL (ref 6–23)
CHLORIDE: 90 meq/L — AB (ref 96–112)
CO2: 34 mmol/L — ABNORMAL HIGH (ref 19–32)
CREATININE: 0.66 mg/dL (ref 0.50–1.35)
Calcium: 8.8 mg/dL (ref 8.4–10.5)
GFR calc Af Amer: >90 mL/min (ref >90)
GFR calc non Af Amer: >90 mL/min (ref >90)
Glucose, Bld: 173 mg/dL — ABNORMAL HIGH (ref 70–99)
POTASSIUM: 4.4 mmol/L (ref 3.5–5.1)
Sodium: 137 mmol/L (ref 135–145)
Total Bilirubin: 0.7 mg/dL (ref 0.3–1.2)
Total Protein: 7.5 g/dL (ref 6.0–8.3)

## 2014-03-16 LAB — RAPID URINE DRUG SCREEN, HOSP PERFORMED
AMPHETAMINES: NOT DETECTED
BENZODIAZEPINES: NOT DETECTED
Barbiturates: NOT DETECTED
COCAINE: NOT DETECTED
OPIATES: NOT DETECTED
TETRAHYDROCANNABINOL: NOT DETECTED

## 2014-03-16 LAB — BASIC METABOLIC PANEL
Anion gap: 8 (ref 5–15)
BUN: 15 mg/dL (ref 6–23)
CO2: 33 mmol/L — ABNORMAL HIGH (ref 19–32)
Calcium: 8.6 mg/dL (ref 8.4–10.5)
Chloride: 88 mEq/L — ABNORMAL LOW (ref 96–112)
Creatinine, Ser: 0.76 mg/dL (ref 0.50–1.35)
Glucose, Bld: 130 mg/dL — ABNORMAL HIGH (ref 70–99)
POTASSIUM: 4.3 mmol/L (ref 3.5–5.1)
SODIUM: 129 mmol/L — AB (ref 135–145)

## 2014-03-16 LAB — I-STAT ARTERIAL BLOOD GAS, ED
ACID-BASE EXCESS: 6 mmol/L — AB (ref 0.0–2.0)
BICARBONATE: 35 meq/L — AB (ref 20.0–24.0)
O2 Saturation: 88 %
PH ART: 7.315 — AB (ref 7.350–7.450)
TCO2: 37 mmol/L (ref 0–100)
pCO2 arterial: 68.5 mmHg (ref 35.0–45.0)
pO2, Arterial: 61 mmHg — ABNORMAL LOW (ref 80.0–100.0)

## 2014-03-16 LAB — GLUCOSE, CAPILLARY: GLUCOSE-CAPILLARY: 156 mg/dL — AB (ref 70–99)

## 2014-03-16 LAB — CBC
HEMATOCRIT: 45.5 % (ref 39.0–52.0)
HEMOGLOBIN: 14.8 g/dL (ref 13.0–17.0)
MCH: 31.8 pg (ref 26.0–34.0)
MCHC: 32.5 g/dL (ref 30.0–36.0)
MCV: 97.6 fL (ref 78.0–100.0)
Platelets: 251 10*3/uL (ref 150–400)
RBC: 4.66 MIL/uL (ref 4.22–5.81)
RDW: 13.8 % (ref 11.5–15.5)
WBC: 7.6 10*3/uL (ref 4.0–10.5)

## 2014-03-16 LAB — I-STAT CG4 LACTIC ACID, ED: LACTIC ACID, VENOUS: 0.64 mmol/L (ref 0.5–2.2)

## 2014-03-16 LAB — TROPONIN I: Troponin I: <0.03 ng/mL (ref <0.031)

## 2014-03-16 LAB — MRSA PCR SCREENING: MRSA BY PCR: NEGATIVE

## 2014-03-16 LAB — SODIUM, URINE, RANDOM: Sodium, Ur: 108 mmol/L

## 2014-03-16 MED ORDER — SODIUM CHLORIDE 0.9 % IV SOLN
INTRAVENOUS | Status: DC
  Administered 2014-03-16: 21:00:00 via INTRAVENOUS

## 2014-03-16 MED ORDER — DEXTROSE 5 % IV SOLN
1.0000 g | Freq: Once | INTRAVENOUS | Status: AC
  Administered 2014-03-16: 1 g via INTRAVENOUS
  Filled 2014-03-16: qty 10

## 2014-03-16 MED ORDER — ENOXAPARIN SODIUM 40 MG/0.4ML ~~LOC~~ SOLN
40.0000 mg | SUBCUTANEOUS | Status: DC
  Administered 2014-03-16 – 2014-03-18 (×3): 40 mg via SUBCUTANEOUS
  Filled 2014-03-16 (×4): qty 0.4

## 2014-03-16 MED ORDER — IPRATROPIUM-ALBUTEROL 0.5-2.5 (3) MG/3ML IN SOLN
3.0000 mL | Freq: Once | RESPIRATORY_TRACT | Status: AC
  Administered 2014-03-16: 3 mL via RESPIRATORY_TRACT
  Filled 2014-03-16: qty 3

## 2014-03-16 MED ORDER — IPRATROPIUM-ALBUTEROL 0.5-2.5 (3) MG/3ML IN SOLN
3.0000 mL | Freq: Four times a day (QID) | RESPIRATORY_TRACT | Status: DC
  Administered 2014-03-16 – 2014-03-18 (×7): 3 mL via RESPIRATORY_TRACT
  Filled 2014-03-16 (×8): qty 3

## 2014-03-16 MED ORDER — CETYLPYRIDINIUM CHLORIDE 0.05 % MT LIQD: 7.0000 mL | Freq: Two times a day (BID) | OROMUCOSAL | Status: DC

## 2014-03-16 MED ORDER — METHYLPREDNISOLONE SODIUM SUCC 40 MG IJ SOLR
40.0000 mg | Freq: Four times a day (QID) | INTRAMUSCULAR | Status: DC
  Administered 2014-03-16 – 2014-03-17 (×3): 40 mg via INTRAVENOUS
  Filled 2014-03-16 (×7): qty 1

## 2014-03-16 MED ORDER — SODIUM CHLORIDE 0.9 % IV SOLN
INTRAVENOUS | Status: DC
  Administered 2014-03-16: 16:00:00 via INTRAVENOUS

## 2014-03-16 MED ORDER — ALBUTEROL (5 MG/ML) CONTINUOUS INHALATION SOLN
10.0000 mg/h | INHALATION_SOLUTION | RESPIRATORY_TRACT | Status: DC
  Administered 2014-03-16: 10 mg/h via RESPIRATORY_TRACT
  Filled 2014-03-16: qty 20

## 2014-03-16 MED ORDER — BUDESONIDE-FORMOTEROL FUMARATE 160-4.5 MCG/ACT IN AERO
2.0000 | INHALATION_SPRAY | Freq: Two times a day (BID) | RESPIRATORY_TRACT | Status: DC
  Filled 2014-03-16: qty 6

## 2014-03-16 MED ORDER — ACETAMINOPHEN 325 MG PO TABS
650.0000 mg | ORAL_TABLET | ORAL | Status: DC | PRN
  Administered 2014-03-16 – 2014-03-18 (×3): 650 mg via ORAL
  Filled 2014-03-16 (×3): qty 2

## 2014-03-16 MED ORDER — IPRATROPIUM-ALBUTEROL 0.5-2.5 (3) MG/3ML IN SOLN
3.0000 mL | RESPIRATORY_TRACT | Status: DC
  Filled 2014-03-16: qty 3

## 2014-03-16 MED ORDER — CHLORHEXIDINE GLUCONATE 0.12 % MT SOLN: 15.0000 mL | Freq: Two times a day (BID) | OROMUCOSAL | Status: DC

## 2014-03-16 MED ORDER — METHYLPREDNISOLONE SODIUM SUCC 125 MG IJ SOLR
60.0000 mg | Freq: Every day | INTRAMUSCULAR | Status: DC
Start: 2014-03-17 — End: 2014-03-16

## 2014-03-16 MED ORDER — ONDANSETRON HCL 4 MG/2ML IJ SOLN
4.0000 mg | Freq: Once | INTRAMUSCULAR | Status: AC
  Administered 2014-03-16: 4 mg via INTRAVENOUS
  Filled 2014-03-16: qty 2

## 2014-03-16 MED ORDER — METHYLPREDNISOLONE SODIUM SUCC 125 MG IJ SOLR: 60.0000 mg | Freq: Every day | INTRAMUSCULAR | Status: DC

## 2014-03-16 MED ORDER — METHYLPREDNISOLONE SODIUM SUCC 125 MG IJ SOLR
125.0000 mg | Freq: Once | INTRAMUSCULAR | Status: AC
  Administered 2014-03-16: 125 mg via INTRAVENOUS
  Filled 2014-03-16: qty 2

## 2014-03-16 MED ORDER — IPRATROPIUM-ALBUTEROL 0.5-2.5 (3) MG/3ML IN SOLN
3.0000 mL | RESPIRATORY_TRACT | Status: DC | PRN
  Filled 2014-03-16: qty 3

## 2014-03-16 NOTE — ED Notes (Signed)
Called 2H, Rm 17 is now ready. Paged RT to transport pt.

## 2014-03-16 NOTE — Progress Notes (Signed)
[facility] Progress Note Patient Name: Clarence Murray DOB: 10-29-1965 MRN: [id number]   Date of Service  03/16/2014  HPI/Events of Note  headache  eICU Interventions  Prn tylenal     Intervention Category Minor Interventions: Routine modifications to care plan (e.g. PRN medications for pain, fever)  Raylene Miyamoto. 03/16/2014, 9:55 PM

## 2014-03-16 NOTE — ED Notes (Signed)
Pt still doing resp treatment, dozing -- family states that he did not sleep at all last night.

## 2014-03-16 NOTE — ED Notes (Signed)
Per 2H RN, pt's room is changed to Rm 17, but room is dirty. Per RN, she will call when room is clean.

## 2014-03-16 NOTE — ED Notes (Signed)
Pt reports having sob, initially called ems and felt better after breathing tx then had increase in symptoms. Sent here by pcp due to low spo2 and swelling to his legs recently. spo2 78% at triage, pt is able to speak in full sentences, hx of copd.  ekg done.

## 2014-03-16 NOTE — ED Notes (Signed)
MD at bedside. 

## 2014-03-16 NOTE — ED Notes (Signed)
Called Respiratory to come evaluate for a CAT

## 2014-03-16 NOTE — ED Notes (Signed)
Attempted report x1. 

## 2014-03-16 NOTE — ED Notes (Signed)
PA Jennifer at bedside.  

## 2014-03-16 NOTE — H&P (Signed)
Date: 03/16/2014               Patient Name:  Clarence Murray MRN: [id number]  DOB: 1965-09-13 Age / Sex: 49 y.o., male   PCP: No primary care provider on file.         Medical Service: Internal Medicine Teaching Service         Attending Physician: Dr. Sid Falcon, MD    First Contact: Dr. Hulen Luster Pager: [phone]  Second Contact: Dr. Denton Brick Pager: [phone]       After Hours (After 5p/  First Contact Pager: [phone]  weekends / holidays): Second Contact Pager: [phone]   Chief Complaint: SOB  History of Present Illness: Pt is a 49 y/o male w/ PMHx of COPD and chronic pain who presented to the ED w/ SOB. Pt was seen in step down w/ BIPAP on due to desaturations to the 70's. Thus HPI obtained from chart review. Pt started having SOB last night and called EMS but refused to go to the hospital. Pt was seen by his PCP this morning and he was noted to be hypoxic and had lower extremity edema. He was then sent to the ED. In the ED pt was noted to have O2 sat of 78% on room air on admission w/ improvement to the 90's w/ 2L [place]. ABG was performed and revealed CO2 of 68.5 and he was then started on BIPAP, repeat ABG during exam revealed CO2 of 100 w/ BIPAP.    Meds: Current Facility-Administered Medications  Medication Dose Route Frequency Provider Last Rate Last Dose  . 0.9 %  sodium chloride infusion   Intravenous STAT Fredia Sorrow, MD 100 mL/hr at 03/16/14 1545    . 0.9 %  sodium chloride infusion   Intravenous Continuous Ejiroghene E Emokpae, MD      . enoxaparin (LOVENOX) injection 40 mg  40 mg Subcutaneous Q24H Ejiroghene E Emokpae, MD      . ipratropium-albuterol (DUONEB) 0.5-2.5 (3) MG/3ML nebulizer solution 3 mL  3 mL Nebulization Q4H Ejiroghene E Emokpae, MD      . Derrill Memo ON 03/17/2014] methylPREDNISolone sodium succinate (SOLU-MEDROL) 125 mg/2 mL injection 60 mg  60 mg Intravenous Daily Ejiroghene Arlyce Dice, MD        Allergies: Allergies as of 03/16/2014  . (No Known  Allergies)   Past Medical History  Diagnosis Date  . COPD (chronic obstructive pulmonary disease)   . Chronic pain    No past surgical history on file. No family history on file. History   Social History  . Marital Status: Married    Spouse Name: N/A    Number of Children: N/A  . Years of Education: N/A   Occupational History  . Not on file.   Social History Main Topics  . Smoking status: Current Every Day Smoker    Types: Cigarettes  . Smokeless tobacco: Not on file  . Alcohol Use: No  . Drug Use: No  . Sexual Activity: Not on file   Other Topics Concern  . Not on file   Social History Narrative  . No narrative on file    Review of Systems: Unable to obtain 2/2 hypoxia requiring BIPAP.   Physical Exam: Blood pressure 169/98, pulse 116, temperature 98.2 F (36.8 C), temperature source Axillary, resp. rate 22, height 6' (1.829 m), weight 195 lb 8.8 oz (88.7 kg), SpO2 100 %. General: in respiratory distress, skin pink Lungs: poor air movement b/l Cardiac: tachycardiac, no murmurs GI: active  bowel sounds, distended, non tender to palpation Ext: neg pedal edema b/l  Lab results: Basic Metabolic Panel:  Recent Labs  03/16/14 1212  NA 129*  K 4.3  CL 88*  CO2 33*  GLUCOSE 130*  BUN 15  CREATININE 0.76  CALCIUM 8.6   CBC:  Recent Labs  03/16/14 1212  WBC 7.6  HGB 14.8  HCT 45.5  MCV 97.6  PLT 251    Imaging results:  Ct Chest Wo Contrast  03/16/2014   CLINICAL DATA:  COPD exacerbation, shortness of breath, low oxygen saturation  EXAM: CT CHEST WITHOUT CONTRAST  TECHNIQUE: Multidetector CT imaging of the chest was performed following the standard protocol without IV contrast. Sagittal and coronal MPR images reconstructed from axial data set.  COMPARISON:  None  FINDINGS: Aorta normal caliber.  Heart appears enlarged.  Significant atherosclerotic calcifications within coronary arteries.  No thoracic adenopathy.  Atrophic pancreas.  Remaining  visualized upper abdomen unremarkable.  Lungs slightly hyperinflated with minimal peribronchial thickening.  No infiltrate, pleural effusion, pneumothorax or mass/ nodule.  Bones demineralized.  IMPRESSION: Changes of COPD.  No acute abnormalities.   Electronically Signed   By: Lavonia Dana M.D.   On: 03/16/2014 16:13   Dg Chest Port 1 View  03/16/2014   CLINICAL DATA:  Shortness of breath.  Low O2 sats.  EXAM: PORTABLE CHEST - 1 VIEW  COMPARISON:  12/30/2013  FINDINGS: Mild cardiomegaly. No overt edema. Increasing atelectasis or infiltrate at the right lung base. No confluent opacity on the left. No effusions. No acute bony abnormality.  IMPRESSION: Cardiomegaly.  Right basilar atelectasis or infiltrate   Electronically Signed   By: Rolm Baptise M.D.   On: 03/16/2014 12:50    Other results: EKG: NSR, HR 81, neg for ST elevations, no previous EKG to compare it to.   Assessment & Plan by Problem: Active Problems:   COPD exacerbation   CAP (community acquired pneumonia)  Severe COPD exacerbation-- CTA negative for PNA, positive for COPD. Due to O2 desaturations to the 70's w/ BIPAP when patient falls asleep PCCM has resumed care for possible need for intubation. On albuterol nebs and inhalers at home. BNP WNL. Lactic acid WNL.  - blood cultures obtained, no abx started  Chronic pain - on methadone 110mg , unsure why, will need to clarify this w/ patient once he is more stable  GERD-- on Nexium 40mg   - continue home med   Dispo: Disposition is deferred at this time, awaiting improvement of current medical problems. Anticipated discharge in approximately 2-3 day(s).   The patient does have a current PCP (No primary care provider on file.) and does not need an [facility] hospital follow-up appointment after discharge.  The patient does not have transportation limitations that hinder transportation to clinic appointments.  Signed: Julious Oka, MD 03/16/2014, 7:50 PM

## 2014-03-16 NOTE — Progress Notes (Signed)
Pt tolerating Bipap, but has progressively become more lethargic. Oxygen levels decrease to 70's when asleep, and increased to 90's when aroused and kept awake. Pt follows commands when awake, but then quickly falls asleep.  Respiratory therapist paged and came to bedside. Internal medicine residents paged to assess pt.   ABG taken and setting adjusted on Bipap per RT.

## 2014-03-16 NOTE — ED Provider Notes (Addendum)
CSN: [id number]     Arrival date & time 03/16/14  1144 History   First MD Initiated Contact with Patient 03/16/14 1157     Chief Complaint  Patient presents with  . Shortness of Breath     (Consider location/radiation/quality/duration/timing/severity/associated sxs/prior Treatment) Patient is a 49 y.o. male presenting with shortness of breath. The history is provided by the patient.  Shortness of Breath Associated symptoms: wheezing   Associated symptoms: no abdominal pain, no chest pain, no fever, no headaches, no rash and no vomiting    patient sent in from his doctor's office in the [place] area, patient with hypoxia there. Concern for either exacerbation of COPD or congestive heart failure since he had bilateral leg swelling. Patient known to have COPD uses inhalers at home not currently on steroids. No chest pain. Patient was very short of breath last night EMS was out the house and wanted bring him to the hospital but he refused.  Past Medical History  Diagnosis Date  . COPD (chronic obstructive pulmonary disease)   . Chronic pain    No past surgical history on file. No family history on file. History  Substance Use Topics  . Smoking status: Current Every Day Smoker    Types: Cigarettes  . Smokeless tobacco: Not on file  . Alcohol Use: No    Review of Systems  Constitutional: Positive for fatigue. Negative for fever.  HENT: Negative for congestion.   Eyes: Negative for visual disturbance.  Respiratory: Positive for shortness of breath and wheezing.   Cardiovascular: Positive for leg swelling. Negative for chest pain.  Gastrointestinal: Negative for nausea, vomiting and abdominal pain.  Genitourinary: Negative for dysuria.  Musculoskeletal: Negative for back pain.  Skin: Negative for rash.  Neurological: Negative for headaches.  Hematological: Does not bruise/bleed easily.  Psychiatric/Behavioral: Negative for confusion.      Allergies  Review of patient's  allergies indicates no known allergies.  Home Medications   Prior to Admission medications   Medication Sig Start Date End Date Taking? Authorizing Provider  albuterol (PROVENTIL HFA;VENTOLIN HFA) 108 (90 BASE) MCG/ACT inhaler Inhale 2 puffs into the lungs every 8 (eight) hours as needed for wheezing or shortness of breath.   Yes Historical Provider, MD  albuterol (PROVENTIL) (2.5 MG/3ML) 0.083% nebulizer solution Take 2.5 mg by nebulization 2 (two) times daily.   Yes Historical Provider, MD  esomeprazole (NEXIUM) 40 MG capsule Take 40 mg by mouth daily at 12 noon.   Yes Historical Provider, MD  Fluticasone Furoate-Vilanterol 100-25 MCG/INH AEPB Inhale 1 puff into the lungs daily.   Yes Historical Provider, MD  methadone (DOLOPHINE) 10 MG/ML solution Take 110 mg by mouth daily.   Yes Historical Provider, MD   BP 146/79 mmHg  Pulse 95  Temp(Src) 98.3 F (36.8 C) (Oral)  Resp 13  SpO2 90% Physical Exam  Constitutional: He is oriented to person, place, and time. He appears well-developed and well-nourished. He appears distressed.  HENT:  Head: Normocephalic and atraumatic.  Mouth/Throat: Oropharynx is clear and moist.  Eyes: EOM are normal. Pupils are equal, round, and reactive to light.  Neck: Normal range of motion.  Cardiovascular: Normal rate, regular rhythm and normal heart sounds.   Pulmonary/Chest: He is in respiratory distress. He has wheezes. He has no rales.  Abdominal: Bowel sounds are normal. There is no tenderness.  Musculoskeletal: Normal range of motion. He exhibits edema.  Neurological: He is alert and oriented to person, place, and time. No cranial nerve deficit. He  exhibits normal muscle tone. Coordination normal.  Skin: Skin is warm. No rash noted.  Nursing note and vitals reviewed.   ED Course  Procedures (including critical care time) Labs Review Labs Reviewed  BASIC METABOLIC PANEL - Abnormal; Notable for the following:    Sodium 129 (*)    Chloride 88 (*)     CO2 33 (*)    Glucose, Bld 130 (*)    All other components within normal limits  I-STAT ARTERIAL BLOOD GAS, ED - Abnormal; Notable for the following:    pH, Arterial 7.315 (*)    pCO2 arterial 68.5 (*)    pO2, Arterial 61.0 (*)    Bicarbonate 35.0 (*)    Acid-Base Excess 6.0 (*)    All other components within normal limits  CULTURE, BLOOD (ROUTINE X 2)  CULTURE, BLOOD (ROUTINE X 2)  CBC  BRAIN NATRIURETIC PEPTIDE  I-STAT TROPOININ, ED  I-STAT CG4 LACTIC ACID, ED   Results for orders placed or performed during the hospital encounter of 03/16/14  CBC  Result Value Ref Range   WBC 7.6 4.0 - 10.5 K/uL   RBC 4.66 4.22 - 5.81 MIL/uL   Hemoglobin 14.8 13.0 - 17.0 g/dL   HCT 45.5 39.0 - 52.0 %   MCV 97.6 78.0 - 100.0 fL   MCH 31.8 26.0 - 34.0 pg   MCHC 32.5 30.0 - 36.0 g/dL   RDW 13.8 11.5 - 15.5 %   Platelets 251 150 - 400 K/uL  BNP (order ONLY if patient complains of dyspnea/SOB AND you have documented it for THIS visit)  Result Value Ref Range   B Natriuretic Peptide 34.8 0.0 - 100.0 pg/mL  Basic metabolic panel  Result Value Ref Range   Sodium 129 (L) 135 - 145 mmol/L   Potassium 4.3 3.5 - 5.1 mmol/L   Chloride 88 (L) 96 - 112 mEq/L   CO2 33 (H) 19 - 32 mmol/L   Glucose, Bld 130 (H) 70 - 99 mg/dL   BUN 15 6 - 23 mg/dL   Creatinine, Ser 0.76 0.50 - 1.35 mg/dL   Calcium 8.6 8.4 - 10.5 mg/dL   GFR calc non Af Amer >90 >90 mL/min   GFR calc Af Amer >90 >90 mL/min   Anion gap 8 5 - 15  I-stat troponin, ED (not at [facility])  Result Value Ref Range   Troponin i, poc 0.00 0.00 - 0.08 ng/mL   Comment 3          I-Stat CG4 Lactic Acid, ED  Result Value Ref Range   Lactic Acid, Venous 0.64 0.5 - 2.2 mmol/L  I-Stat Arterial Blood Gas, ED - (order at [facility] and [facility] only)  Result Value Ref Range   pH, Arterial 7.315 (L) 7.350 - 7.450   pCO2 arterial 68.5 (HH) 35.0 - 45.0 mmHg   pO2, Arterial 61.0 (L) 80.0 - 100.0 mmHg   Bicarbonate 35.0 (H) 20.0 - 24.0 mEq/L   TCO2 37 0 - 100 mmol/L    O2 Saturation 88.0 %   Acid-Base Excess 6.0 (H) 0.0 - 2.0 mmol/L   Patient temperature 98.3 F    Collection site RADIAL, ALLEN'S TEST ACCEPTABLE    Drawn by Operator    Sample type ARTERIAL    Comment NOTIFIED PHYSICIAN      Imaging Review Dg Chest Port 1 View  03/16/2014   CLINICAL DATA:  Shortness of breath.  Low O2 sats.  EXAM: PORTABLE CHEST - 1 VIEW  COMPARISON:  12/30/2013  FINDINGS: Mild cardiomegaly. No overt edema. Increasing atelectasis or infiltrate at the right lung base. No confluent opacity on the left. No effusions. No acute bony abnormality.  IMPRESSION: Cardiomegaly.  Right basilar atelectasis or infiltrate   Electronically Signed   By: Rolm Baptise M.D.   On: 03/16/2014 12:50     EKG Interpretation   Date/Time:  Wednesday March 16 2014 11:49:53 EST Ventricular Rate:  81 PR Interval:  142 QRS Duration: 84 QT Interval:  398 QTC Calculation: 462 R Axis:   78 Text Interpretation:  Normal sinus rhythm with sinus arrhythmia Normal ECG  artifact in V1 Confirmed by Thyra Yinger  MD, Jakeim Sedore ([id number]) on 03/16/2014  12:09:53 PM     CRITICAL CARE Performed by: Fredia Sorrow Total critical care time: 30 Critical care time was exclusive of separately billable procedures and treating other patients. Critical care was necessary to treat or prevent imminent or life-threatening deterioration. Critical care was time spent personally by me on the following activities: development of treatment plan with patient and/or surrogate as well as nursing, discussions with consultants, evaluation of patient's response to treatment, examination of patient, obtaining history from patient or surrogate, ordering and performing treatments and interventions, ordering and review of laboratory studies, ordering and review of radiographic studies, pulse oximetry and re-evaluation of patient's condition.  MDM   Final diagnoses:  SOB (shortness of breath)  Hypoxia  Chronic obstructive pulmonary  disease with acute exacerbation    Patient with hypoxia without oxygen. With 2 L of oxygen oxygen saturation sometimes borderline but mostly 90% or better. Chest x-ray raises concern for right-sided pneumonia. CT scan of the chest is pending to evaluate that further. Patient on room air in triage had oxygen saturation is 78%. Patient's blood gas concerning with CO2 retention. Patient remains alert and active. However may end up having to go on BiPAP. Patient lactic acid is not elevated patient's troponin is negative no evidence of acute cardiac event. Her missed MI. Patient's BNP is not elevated therefore not likely to be consistent with congestive heart failure or pulmonary edema.  Leave the patient's symptoms predominantly are exacerbation of COPD and possible pneumonia. Blood cultures have been done. Patient not started on antibiotics yet.  Patient emergency department received 125 mg Solu-Medrol were seen 1 DuoNeb. And started on a continuous neb. A shunt in better on continuous neb. Some wheezing has persisted.    Fredia Sorrow, MD 03/16/14 1443   Patient finished the continuous nebulizer almost got nauseated couldn't tolerate it so stopped. Lungs still with some wheezing. Due to the blood gas will start on BiPAP. Patient has not had admission to the hospital in the last 3 months. So we'll treat the potential pneumonia as a community-acquired pneumonia. CT scan still pending to verify. We will discuss with admitting team. Patient will require admission.  Fredia Sorrow, MD 03/16/14 1459

## 2014-03-16 NOTE — ED Notes (Signed)
Respiratory at bedside placing pt on Bipap 

## 2014-03-16 NOTE — Consult Note (Signed)
PULMONARY / CRITICAL CARE MEDICINE   Name: Clarence Murray MRN: [id number] DOB: 17-Dec-1965    ADMISSION DATE:  03/16/2014 CONSULTATION DATE:  03/16/14  REFERRING MD :  Dr. Daryll Drown  CHIEF COMPLAINT:  SOB  INITIAL PRESENTATION: 49 year old smoker male with COPD presented to the ED 1/13 for worsening SOB x2 days and hypoxia. ABG on admission 7.3/68.5/61/35/88%. Started on BiPAP in ED and transferred to SDU. Repeat ABG 7.18/95/94/36.1/94%. Drowsy but arousable. Transferred to ICU, possible intubation.   STUDIES:  pCXR 1/13: R basilar atelectasis vs. Infiltrate CT chest without contrast 1/13: hyperinflated lungs with minimal peribronchial thickening. No infiltrate.  PFTs 08/2006 (per prior d/c summary): severe obstruction, FEV1 57%, FEV1/FVC 315, DLCO 41%, VC 57%  SIGNIFICANT EVENTS: 1/13: Admit to teaching service for COPD exacerbation 1/13: Progressive hypercapnia and lethargy on BiPAP, ABG 7.18/95-->transfered to ICU, may require intubation   HISTORY OF PRESENT ILLNESS:  Clarence Murray is a 49 year old smoker male with severe COPD (FEV1 57% in 2008) on chronic methadone for 15 years for drug dependence and chronic pain presented to the ED today for worsening SOB x2 days. EMS was called to his house last night when he was not feeling well, had some ankle swelling and a headache, and was noted to have low o2 sat in the 70s per wife on their arrival. Oxygen and headache improved with breathing treatment and he refused transfer to hospital via EMS. Not much improvement overnight and the wife took him to his PCP's office this morning where he was again noted to be hypoxic. He was subsequently transferred to the ED from PCP office for further evaluation.  He says he was just not feeling well at home, and was apparently sent to the ED from his doctors office for hypoxia. Headache resolved in the ED after breathing treatment.  Per ED note, he was noted to be wheezing on admission and o2 sat 78% on room air.    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  He has not slept much over the past two days and decreased appetite with decreased po intake.  He takes 110mg  of methadone daily and did not take his dose today. Denies any other illicit drug use or alcohol use. He denies any sick contacts but did get his flu and pneumococcal vaccination. PCP is Dr. Milly Jakob, [facility] in [place]. He denies any recent travel and endorses b/l ankle swelling yesterday that has resolved today. He smokes 2 packs of cigarettes per day for 10+ years but has not smoked for the past 4 days.   PAST MEDICAL HISTORY :   has a past medical history of COPD (chronic obstructive pulmonary disease) and Chronic pain.  has no past surgical history on file. Prior to Admission medications   Medication Sig Start Date End Date Taking? Authorizing Provider  albuterol (PROVENTIL HFA;VENTOLIN HFA) 108 (90 BASE) MCG/ACT inhaler Inhale 2 puffs into the lungs every 8 (eight) hours as needed for wheezing or shortness of breath.   Yes Historical Provider, MD  albuterol (PROVENTIL) (2.5 MG/3ML) 0.083% nebulizer solution Take 2.5 mg by nebulization 2 (two) times daily.   Yes Historical Provider, MD  esomeprazole (NEXIUM) 40 MG capsule Take 40 mg by mouth daily at 12 noon.   Yes Historical Provider, MD  Fluticasone Furoate-Vilanterol 100-25 MCG/INH AEPB Inhale 1 puff into the lungs daily.   Yes Historical Provider, MD  methadone (DOLOPHINE) 10 MG/ML solution Take 110 mg by mouth daily.  Yes Historical Provider, MD   No Known Allergies  FAMILY HISTORY:  +lung CA in mother +MI and early death from MI in father (died age 10) and sister (died age 79) +CHF maternal grandmother  SOCIAL HISTORY:  reports that he has been smoking Cigarettes.  He has been smoking about 2.00 packs per day. He does not have any smokeless tobacco history on file. He reports that he does not drink alcohol or use illicit drugs.  Review of Systems:  Constitutional:   Chills, sleepy, decreased appetite  HEENT:  Denies sore throat  Respiratory:  SOB, chronic intermittent cough. Denies DOE.   Cardiovascular:  Denies chest pain. Intermittent leg swelling  Gastrointestinal:  Denies abdominal pain  Genitourinary:  Denies dysuria. Trouble urinating at this time.   Musculoskeletal:  Chronic pain  Skin:  Denies pallor, rash and wound.   Neurological:  Headaches   SUBJECTIVE: Sitting up in bed, on BiPAP, speaking in full sentences, but sleepy. Reports feeling better since admission. No headache or chest pain.   VITAL SIGNS: Temp:  [98.2 F (36.8 C)-98.3 F (36.8 C)] 98.2 F (36.8 C) (01/13 1730) Pulse Rate:  [83-120] 101 (01/13 2004) Resp:  [10-22] 22 (01/13 2004) BP: (126-175)/(65-98) 169/98 mmHg (01/13 1942) SpO2:  [9 %-100 %] 99 % (01/13 2004) FiO2 (%):  [40 %] 40 % (01/13 2003) Weight:  [195 lb 8.8 oz (88.7 kg)] 195 lb 8.8 oz (88.7 kg) (01/13 1730) HEMODYNAMICS:   VENTILATOR SETTINGS: Vent Mode:  [-]  FiO2 (%):  [40 %] 40 % INTAKE / OUTPUT: No intake or output data in the 24 hours ending 03/16/14 2007  PHYSICAL EXAMINATION: General:  Sitting in bed, on BiPAP, NAD Neuro:  AAOX3, but sleepy HEENT:  EOMI Cardiovascular:  Tachycardia Lungs:  Decreased air movement, no wheezing Abdomen:  Soft, distended, non-tender to palpation, +bs Musculoskeletal:  Moving all extremities, tremor in b/l hands, -edema Skin:  dry  LABS:  CBC  Recent Labs Lab 03/16/14 1212  WBC 7.6  HGB 14.8  HCT 45.5  PLT 251   BMET  Recent Labs Lab 03/16/14 1212  NA 129*  K 4.3  CL 88*  CO2 33*  BUN 15  CREATININE 0.76  GLUCOSE 130*   Electrolytes  Recent Labs Lab 03/16/14 1212  CALCIUM 8.6   Sepsis Markers  Recent Labs Lab 03/16/14 1337  LATICACIDVEN 0.64   ABG  Recent Labs Lab 03/16/14 1357 03/16/14 1925  PHART 7.315* 7.186*  PCO2ART 68.5* 95.0*  PO2ART 61.0* 94.0   Cardiac Enzymes poc troponin i: 0.00  ASSESSMENT /  PLAN:  PULMONARY A: AECOPD Respiratory acidosis ABG    Component Value Date/Time   PHART 7.186* 03/16/2014 1925   PCO2ART 95.0* 03/16/2014 1925   PO2ART 94.0 03/16/2014 1925   HCO3 36.1* 03/16/2014 1925   TCO2 39 03/16/2014 1925   O2SAT 94.0 03/16/2014 1925   P:   Currently BiPAP but noted to desat when he sleeps Increase solumedrol to 40mg  IV q6h Continue duonebz scheduled q6h and add prn q3h May require intubation UDS pending  CARDIOVASCULAR  A:  Tachycardia--likely secondary to BD's Significant family hx of early MI EKG 81bpm, NSR, t wave flattening aVL  P:  CE cycling  RENAL A:   Hyponatremia--na 129 on admission. Decreased po intake past 2 days.   P:   Started on NS on admission Repeat BMET--caution correction of Na Check Urine Na, Osm, Serum osm  GASTROINTESTINAL A:   Currently NPO on BiPAP  P:  Advance diet as tolerated when able to come off BiPAP  HEMATOLOGIC A:   No issues  P:  Lovenox for DVT Prophylaxis  INFECTIOUS A:   No clear source of infection, no leukocytosis, afebrile  P:   BCx2 1/13>>> Resp Virus Panel 1/13>>>  Abx: received 1 dose of Rocephin in ED  ENDOCRINE A:   Mild hypeglycemia   P:   May need SSI while on steroids  NEUROLOGIC A:   Drowsy  On chronic methadone 110mg  daily Hyponatremia  P:   Try to keep awake if possible Hold sedating medications Monitor for opoid withdrawal--last dose yesterday Monitor Na  FAMILY  - Updates: Updated wife and family at bedside this evening  - Inter-disciplinary family meet or Palliative Care meeting due by:  day 7  Signed: Jerene Pitch, MD PGY-3, Internal Medicine Resident Pager: [phone]  03/16/2014,8:28 PM  Reviewed above, examined.  49 yo male admitted with progressive dyspnea.  He has hx of COPD and chronic methadone therapy for prior hx of substance abuse.  He was found to have hypoxic/hypercapnic respiratory failure.  He had ankle swelling, headache, and  jerking movements.  He was started on BiPAP.  PCCM consulted to assess whether he needed intubation.  At time of examination he was alert, and conversant.  He denies chest pain or abdominal pain.  His breathing had improved with BiPAP.  He no longer was having jerking movements.  His breath sounds were diminished, but no wheezing evident.  He did have mild ankle swelling.  He was noted to have oxygen desaturation while asleep, that improved when he was woken up  Transferred to ICU for closer monitoring.  Continue BiPAP.  He may have sleep apnea causing oxygen desaturation while asleep >> might need to adjust EPAP up while asleep to maintain upper airway patency.  F/u ABG.  Continue BDs and solumedrol.  CC time by me independent of resident time is 40 minutes.  Chesley Mires, MD [facility] Pulmonary/Critical Care 03/16/2014, 11:00 PM Pager:  [phone] After 3pm call: [phone]

## 2014-03-17 DIAGNOSIS — G8929 Other chronic pain: Secondary | ICD-10-CM | POA: Diagnosis present

## 2014-03-17 LAB — RESPIRATORY VIRUS PANEL
ADENOVIRUS: NOT DETECTED
INFLUENZA A H3: NOT DETECTED
INFLUENZA A: NOT DETECTED
Influenza A H1: NOT DETECTED
Influenza B: NOT DETECTED
METAPNEUMOVIRUS: NOT DETECTED
PARAINFLUENZA 2 A: NOT DETECTED
Parainfluenza 1: NOT DETECTED
Parainfluenza 3: NOT DETECTED
RESPIRATORY SYNCYTIAL VIRUS A: NOT DETECTED
RESPIRATORY SYNCYTIAL VIRUS B: NOT DETECTED
Rhinovirus: NOT DETECTED

## 2014-03-17 LAB — CBC
HCT: 41.3 % (ref 39.0–52.0)
HEMOGLOBIN: 13.4 g/dL (ref 13.0–17.0)
MCH: 32 pg (ref 26.0–34.0)
MCHC: 32.4 g/dL (ref 30.0–36.0)
MCV: 98.6 fL (ref 78.0–100.0)
Platelets: 232 10*3/uL (ref 150–400)
RBC: 4.19 MIL/uL — ABNORMAL LOW (ref 4.22–5.81)
RDW: 13.7 % (ref 11.5–15.5)
WBC: 8.6 10*3/uL (ref 4.0–10.5)

## 2014-03-17 LAB — BASIC METABOLIC PANEL
Anion gap: 8 (ref 5–15)
Anion gap: 18 — ABNORMAL HIGH (ref 5–15)
BUN: 18 mg/dL (ref 6–23)
BUN: 17 mg/dL (ref 6–23)
CHLORIDE: 94 meq/L — AB (ref 96–112)
CHLORIDE: 90 meq/L — AB (ref 96–112)
CO2: 34 mmol/L — ABNORMAL HIGH (ref 19–32)
CO2: 29 mmol/L (ref 19–32)
Calcium: 9 mg/dL (ref 8.4–10.5)
Calcium: 9.4 mg/dL (ref 8.4–10.5)
Creatinine, Ser: 0.75 mg/dL (ref 0.50–1.35)
Creatinine, Ser: 0.84 mg/dL (ref 0.50–1.35)
GFR calc non Af Amer: >90 mL/min (ref >90)
GLUCOSE: 152 mg/dL — AB (ref 70–99)
GLUCOSE: 141 mg/dL — AB (ref 70–99)
Potassium: 4.1 mmol/L (ref 3.5–5.1)
Potassium: 4 mmol/L (ref 3.5–5.1)
Sodium: 136 mmol/L (ref 135–145)
Sodium: 137 mmol/L (ref 135–145)

## 2014-03-17 LAB — POCT I-STAT 3, ART BLOOD GAS (G3+)
Acid-Base Excess: 6 mmol/L — ABNORMAL HIGH (ref 0.0–2.0)
Bicarbonate: 32.4 mEq/L — ABNORMAL HIGH (ref 20.0–24.0)
O2 Saturation: 98 %
PO2 ART: 98 mmHg (ref 80.0–100.0)
TCO2: 34 mmol/L (ref 0–100)
pCO2 arterial: 52.7 mmHg — ABNORMAL HIGH (ref 35.0–45.0)
pH, Arterial: 7.394 (ref 7.350–7.450)

## 2014-03-17 LAB — OSMOLALITY: Osmolality: 290 mOsm/kg (ref 275–300)

## 2014-03-17 LAB — TROPONIN I
Troponin I: <0.03 ng/mL (ref <0.031)
Troponin I: <0.03 ng/mL (ref <0.031)

## 2014-03-17 LAB — MAGNESIUM: Magnesium: 1.8 mg/dL (ref 1.5–2.5)

## 2014-03-17 LAB — OSMOLALITY, URINE: Osmolality, Ur: 626 mOsm/kg (ref 390–1090)

## 2014-03-17 MED ORDER — HYDROMORPHONE HCL 1 MG/ML IJ SOLN: 1.0000 mg | Freq: Once | INTRAMUSCULAR | Status: DC

## 2014-03-17 MED ORDER — METHADONE HCL 10 MG PO TABS
110.0000 mg | ORAL_TABLET | Freq: Every day | ORAL | Status: DC
  Administered 2014-03-17 – 2014-03-19 (×3): 110 mg via ORAL
  Filled 2014-03-17 (×3): qty 11

## 2014-03-17 MED ORDER — PREDNISONE 20 MG PO TABS
30.0000 mg | ORAL_TABLET | Freq: Every day | ORAL | Status: DC
  Administered 2014-03-18 – 2014-03-19 (×2): 30 mg via ORAL
  Filled 2014-03-17 (×3): qty 1

## 2014-03-17 MED ORDER — LORAZEPAM 1 MG PO TABS
1.0000 mg | ORAL_TABLET | Freq: Once | ORAL | Status: AC
  Administered 2014-03-17: 1 mg via ORAL
  Filled 2014-03-17: qty 1

## 2014-03-17 NOTE — Progress Notes (Signed)
Pt placed back on BIPAP tolerating well at this time. RT will continue to monitor.

## 2014-03-17 NOTE — Progress Notes (Deleted)
[facility] Progress Note Patient Name: Clarence Murray DOB: 27-Apr-1965 MRN: [id number]   Date of Service  03/17/2014  HPI/Events of Note  Entered in error  eICU Interventions       Intervention Category Intermediate Interventions: Pain - evaluation and management  Manya Balash 03/17/2014, 6:55 AM

## 2014-03-17 NOTE — Progress Notes (Signed)
[facility] Progress Note Patient Name: HESTER FORGET DOB: 1965-03-26 MRN: [id number]   Date of Service  03/17/2014  HPI/Events of Note  Nurse noting increased ectopy, HD stable Intermittent hypoxemia on BIPAP  eICU Interventions  ABG now BMET/Mg now     Intervention Category Major Interventions: Arrhythmia - evaluation and management  Jarryn Altland 03/17/2014, 1:17 AM

## 2014-03-17 NOTE — Care Management Note (Addendum)
    Page 1 of 1   03/19/2014     11:28:01 AM CARE MANAGEMENT NOTE 03/19/2014  Patient:  Clarence Murray, Clarence Murray   Account Number:  [id number]  Date Initiated:  03/17/2014  Documentation initiated by:  Elissa Hefty  Subjective/Objective Assessment:   adm w copd exacerbation     Action/Plan:   lives w family   Anticipated DC Date:  03/19/2014   Anticipated DC Plan:  [place]  CM consult      [facility] Choice  DURABLE MEDICAL EQUIPMENT   Choice offered to / List presented to:  C-3 Spouse   DME arranged  [facility]      DME agency  [place].        Status of service:  Completed, signed off Medicare Important Message given?   (If response is "NO", the following Medicare IM given date fields will be blank) Date Medicare IM given:   Medicare IM given by:   Date Additional Medicare IM given:   Additional Medicare IM given by:    Discharge Disposition:  HOME/SELF CARE  Per UR Regulation:  Reviewed for med. necessity/level of care/duration of stay  If discussed at [place] of Stay Meetings, dates discussed:    Comments:  03/19/14 11:20 CM spoke with wife, Vaughan Basta of pt who states [facility] is fine or home oxygen.  Linda requested CM check on whether or not insurance would cover a pulse oximetry.  CM called [facility] rep, Lecretia who states it is an out of pocket expense and [facility] pulse oximetry is 52.00.  CM relayed information to [place].  CM called [facility] DME rep, Jeneen Rinks to please deliver the oxygen to the room prior to discharge. No other CM needs were communicated.  Mariane Masters, BSN, CM [phone].

## 2014-03-17 NOTE — Progress Notes (Signed)
Dr. Lake Bells notified of pt's ABG results. No new orders. Will continue to monitor.

## 2014-03-17 NOTE — Progress Notes (Signed)
PULMONARY / CRITICAL CARE MEDICINE   Name: Clarence Murray MRN: [id number] DOB: 09-Sep-1965    ADMISSION DATE:  03/16/2014 CONSULTATION DATE:  03/16/14  REFERRING MD :  Dr. Daryll Drown  CHIEF COMPLAINT:  SOB  INITIAL PRESENTATION: 49 year old smoker male with COPD presented to the ED 1/13 for worsening SOB x2 days and hypoxia. ABG on admission 7.3/68.5/61/35/88%. Started on BiPAP in ED and transferred to SDU. Repeat ABG 7.18/95/94/36.1/94%. Drowsy but arousable. Transferred to ICU, possible intubation.   STUDIES:  pCXR 1/13: R basilar atelectasis vs. Infiltrate CT chest without contrast 1/13: hyperinflated lungs with minimal peribronchial thickening. No infiltrate.  PFTs 08/2006 (per prior d/c summary): severe obstruction, FEV1 57%, FEV1/FVC 315, DLCO 41%, VC 57%  SIGNIFICANT EVENTS: 1/13: Admit to teaching service for COPD exacerbation 1/13: Progressive hypercapnia and lethargy on BiPAP, ABG 7.18/95-->transfered to ICU, may require intubation 1/13: Chest CT w/o contrast >> no infiltrates. Changes of COPD  SUBJECTIVE:  Feeling better this morning but has some increased SOB at night requiring BiBAP. Currently on 2L [place] O2. He complains of poor sleep and being hungry. ABG improved after BiBAP   VITAL SIGNS: Temp:  [97.4 F (36.3 C)-98.3 F (36.8 C)] 97.4 F (36.3 C) (01/14 0400) Pulse Rate:  [83-120] 94 (01/14 0600) Resp:  [10-23] 12 (01/14 0600) BP: (101-175)/(51-98) 129/89 mmHg (01/14 0600) SpO2:  [9 %-100 %] 98 % (01/14 0810) FiO2 (%):  [40 %] 40 % (01/14 0511) Weight:  [195 lb 8.8 oz (88.7 kg)] 195 lb 8.8 oz (88.7 kg) (01/13 1730) HEMODYNAMICS:   VENTILATOR SETTINGS: Vent Mode:  [-]  FiO2 (%):  [40 %] 40 % INTAKE / OUTPUT:  Intake/Output Summary (Last 24 hours) at 03/17/14 0854 Last data filed at 03/17/14 0400  Gross per 24 hour  Intake 101.25 ml  Output    970 ml  Net -868.75 ml    PHYSICAL EXAMINATION: General:  Sitting in bed, on [place] 2L Oxygen Neuro:  AAOX3,  HEENT:   EOMI Cardiovascular:  Tachycardia with regular rate and rhythm Lungs:  Has improved air movement bilaterally. No wheezing Abdomen:  Soft, distended, non-tender to palpation, +bs Musculoskeletal:  Moving all extremities, tremor in b/l hands, -edema Skin:  dry  LABS:  CBC  Recent Labs Lab 03/16/14 1212  WBC 7.6  HGB 14.8  HCT 45.5  PLT 251   BMET  Recent Labs Lab 03/16/14 1212 03/16/14 2020 03/17/14 0350  NA 129* 137 136  K 4.3 4.4 4.1  CL 88* 90* 94*  CO2 33* 34* 34*  BUN 15 13 18   CREATININE 0.76 0.66 0.75  GLUCOSE 130* 173* 152*   Electrolytes  Recent Labs Lab 03/16/14 1212 03/16/14 2020 03/17/14 0350  CALCIUM 8.6 8.8 9.0   Sepsis Markers  Recent Labs Lab 03/16/14 1337  LATICACIDVEN 0.64   ABG  Recent Labs Lab 03/16/14 1357 03/16/14 1925 03/17/14 0153  PHART 7.315* 7.186* 7.394  PCO2ART 68.5* 95.0* 52.7*  PO2ART 61.0* 94.0 98.0   Cardiac Enzymes poc troponin i: 0.00  ASSESSMENT / PLAN:  PULMONARY A: AECOPD >> improving Respiratory acidosis ABG    Component Value Date/Time   PHART 7.394 03/17/2014 0153   PCO2ART 52.7* 03/17/2014 0153   PO2ART 98.0 03/17/2014 0153   HCO3 32.4* 03/17/2014 0153   TCO2 34 03/17/2014 0153   O2SAT 98.0 03/17/2014 0153   P:   Improved with BiPAP. Currently on [place] O2 2L and saturating in mid 90s D/c solumedrol to 40mg  IV q6h and start prednisone  30 mg daily. This might help to avoid steroid psychosis Continue duonebz scheduled q6h and add prn q3h Will cont to monitor his progress. Hopefully will avoid intubation UDS from yesterday clean He says that he has been told that he needs home O2 by his PCP. Will need assessment for this before discharge Will probably need nightly CPAP or BIPAP as his hypoxia seems to come with sleep Will definitely require a sleep study   CARDIOVASCULAR  A:  Tachycardia--likely secondary to BD's Significant family hx of early MI EKG 81bpm, NSR, t wave flattening aVL  P:   trops negative X2 so far  Doubt primary cardiac etiology here  RENAL A:   Hyponatremia>> resolved P:   monitor BMET--caution correction of Na  GASTROINTESTINAL A:   Npo due to need for bipap P:   Start regular diet   HEMATOLOGIC A:   No issues  P:  Lovenox for DVT Prophylaxis  INFECTIOUS A:   No clear source of infection, no leukocytosis, afebrile  P:   BCx2 1/13>>> Resp Virus Panel 1/13>>> Abx: received 1 dose of Rocephin in ED. No abx needed  ENDOCRINE A:   Mild hypeglycemia   P:   Change to Prednisone 30 mg   NEUROLOGIC A:   Currently alert and oriented Anxious and occasionally tearful ? Opiate withdraw On chronic methadone 110mg  daily P:   Restart methadone to avoid withdraw OOB  Transfer to SDU. Notified internal medicine.  Case discussed with Dr Halford Chessman.   Signed:  Jessee Avers, MD PGY-3 Internal Medicine Teaching Service Pager: [phone] 03/17/2014, 9:09 AM   Reviewed above, examined.  Respiratory status much improved.  He feels anxious, but denies chest pain.  Lungs with better air movement.  Will change to po prednisone.  Continue with BiPAP at night and prn during the day.  He will need outpt sleep study to further assess for sleep disordered breathing.  Resume diet, and resume methadone.  Okay to transfer back to SDU.  Updated pt's family at bedside.  Chesley Mires, MD [facility] Pulmonary/Critical Care 03/17/2014, 12:29 PM Pager:  [phone] After 3pm call: [phone]

## 2014-03-17 NOTE — Progress Notes (Signed)
Patient has become increasingly agitated throughout shift. Also noted increased ectopy on ECG monitor, even when pt is at rest. He also desats intermittantly down to the 70's while on Bipap. Dr. Lake Bells notified and received an order for a repeat ABG, as well as a morning magnesium level and BMet. Was instructed to call back with results. Will continue to monitor.

## 2014-03-17 NOTE — Progress Notes (Signed)
Subjective: Pt states breathing has improved. Denies sick contacts. States he has had an ear infection. Quit smoking 4 days ago. BIPAP causes him some anxiety.  Objective: Vital signs in last 24 hours: Filed Vitals:   03/17/14 0512 03/17/14 0600 03/17/14 0803 03/17/14 0810  BP:  129/89 113/65   Pulse: 102 94 104   Temp:   98.4 F (36.9 C)   TempSrc:   Oral   Resp: 15 12 8    Height:      Weight:      SpO2:  99% 100% 98%   Weight change:   Intake/Output Summary (Last 24 hours) at 03/17/14 0943 Last data filed at 03/17/14 0400  Gross per 24 hour  Intake 101.25 ml  Output    970 ml  Net -868.75 ml   General: NAD, tearful during exam Lungs: CTAB, no wheezing, good airmovement Cardiac: RRR, no murmurs GI: soft, active bowel sounds Ext: Neg pedal edema b/l Neuro: CN 2-12 grossly intact  Lab Results: Basic Metabolic Panel:  Recent Labs Lab 03/17/14 0350 03/17/14 0815  NA 136 137  K 4.1 4.0  CL 94* 90*  CO2 34* 29  GLUCOSE 152* 141*  BUN 18 17  CREATININE 0.75 0.84  CALCIUM 9.0 9.4  MG  --  1.8   Liver Function Tests:  Recent Labs Lab 03/16/14 2020  AST 16  ALT 11  ALKPHOS 69  BILITOT 0.7  PROT 7.5  ALBUMIN 4.0   CBC:  Recent Labs Lab 03/16/14 1212 03/17/14 0815  WBC 7.6 8.6  HGB 14.8 13.4  HCT 45.5 41.3  MCV 97.6 98.6  PLT 251 232   Cardiac Enzymes:  Recent Labs Lab 03/16/14 2020 03/17/14 0350 03/17/14 0815  TROPONINI <0.03 <0.03 <0.03   CBG:  Recent Labs Lab 03/16/14 1840  GLUCAP 156*   Urine Drug Screen: Drugs of Abuse     Component Value Date/Time   LABOPIA NONE DETECTED 03/16/2014 2100   COCAINSCRNUR NONE DETECTED 03/16/2014 2100   LABBENZ NONE DETECTED 03/16/2014 2100   AMPHETMU NONE DETECTED 03/16/2014 2100   THCU NONE DETECTED 03/16/2014 2100   LABBARB NONE DETECTED 03/16/2014 2100     Micro Results: Recent Results (from the past 240 hour(s))  Culture, blood (routine x 2)     Status: None (Preliminary result)     Collection Time: 03/16/14  1:25 PM  Result Value Ref Range Status   Specimen Description BLOOD RIGHT HAND  Final   Special Requests BOTTLES DRAWN AEROBIC AND ANAEROBIC 5CC  Final   Culture   Final           BLOOD CULTURE RECEIVED NO GROWTH TO DATE CULTURE WILL BE HELD FOR 5 DAYS BEFORE ISSUING A FINAL NEGATIVE REPORT Performed at [facility]    Report Status PENDING  Incomplete  Culture, blood (routine x 2)     Status: None (Preliminary result)   Collection Time: 03/16/14  1:30 PM  Result Value Ref Range Status   Specimen Description BLOOD LEFT ANTECUBITAL  Final   Special Requests BOTTLES DRAWN AEROBIC AND ANAEROBIC 5CC  Final   Culture   Final           BLOOD CULTURE RECEIVED NO GROWTH TO DATE CULTURE WILL BE HELD FOR 5 DAYS BEFORE ISSUING A FINAL NEGATIVE REPORT Performed at [facility]    Report Status PENDING  Incomplete  MRSA PCR Screening     Status: None   Collection Time: 03/16/14  5:29 PM  Result Value Ref Range Status   MRSA by PCR NEGATIVE NEGATIVE Final    Comment:        The GeneXpert MRSA Assay (FDA approved for NASAL specimens only), is one component of a comprehensive MRSA colonization surveillance program. It is not intended to diagnose MRSA infection nor to guide or monitor treatment for MRSA infections.    Studies/Results: Ct Chest Wo Contrast  03/16/2014   CLINICAL DATA:  COPD exacerbation, shortness of breath, low oxygen saturation  EXAM: CT CHEST WITHOUT CONTRAST  TECHNIQUE: Multidetector CT imaging of the chest was performed following the standard protocol without IV contrast. Sagittal and coronal MPR images reconstructed from axial data set.  COMPARISON:  None  FINDINGS: Aorta normal caliber.  Heart appears enlarged.  Significant atherosclerotic calcifications within coronary arteries.  No thoracic adenopathy.  Atrophic pancreas.  Remaining visualized upper abdomen unremarkable.  Lungs slightly hyperinflated with minimal peribronchial  thickening.  No infiltrate, pleural effusion, pneumothorax or mass/ nodule.  Bones demineralized.  IMPRESSION: Changes of COPD.  No acute abnormalities.   Electronically Signed   By: Lavonia Dana M.D.   On: 03/16/2014 16:13   Dg Chest Port 1 View  03/16/2014   CLINICAL DATA:  Shortness of breath.  Low O2 sats.  EXAM: PORTABLE CHEST - 1 VIEW  COMPARISON:  12/30/2013  FINDINGS: Mild cardiomegaly. No overt edema. Increasing atelectasis or infiltrate at the right lung base. No confluent opacity on the left. No effusions. No acute bony abnormality.  IMPRESSION: Cardiomegaly.  Right basilar atelectasis or infiltrate   Electronically Signed   By: Rolm Baptise M.D.   On: 03/16/2014 12:50   Medications: I have reviewed the patient's current medications. Scheduled Meds: . enoxaparin (LOVENOX) injection  40 mg Subcutaneous Q24H  . ipratropium-albuterol  3 mL Nebulization Q6H  . methadone  110 mg Oral Daily  . [START ON 03/18/2014] predniSONE  30 mg Oral Q breakfast   Continuous Infusions:  PRN Meds:.acetaminophen, ipratropium-albuterol Assessment/Plan: Active Problems:   COPD exacerbation   CAP (community acquired pneumonia)   Severe COPD exacerbation-- CTA negative for PNA, positive for COPD. Required BIPAP last night but now on 2L [place] O2 w/ O2 sat of 94%. Given solumedrol 40mg  IV q6h in the ICU. ABG this morning improved, CO2 down to 52.7 from 95 yesterday.  - blood cultures obtained, no abx started - PCCM following, appreciate their recommendations, prednisone 30mg  starting tomorrow to avoid steroid psychosis - will need outpatient polysomnography  - CPAP qhs - RSV panel pending - will continue to monitor O2 sats.  - HIV pending - will arrange for home O2   Previoius hx of pain med abuse-- goes to [facility] every 2 weeks.  -contine methadone 110mg , last dose 2 days ago. Called [facility] in [place] and verified dose.  Diet-- regular   Dispo: Disposition is deferred at this time, awaiting improvement  of current medical problems. Anticipated discharge in approximately 2-3 day(s).   The patient does have a current PCP (No primary care provider on file.) and does not need an [facility] hospital follow-up appointment after discharge.  The patient does not have transportation limitations that hinder transportation to clinic appointments.  .Services Needed at time of discharge: Y = Yes, Blank = No PT:   OT:   RN:   Equipment:   Other:     LOS: 1 day   Julious Oka, MD 03/17/2014, 9:43 AM

## 2014-03-17 NOTE — Progress Notes (Signed)
Pt was placed on BIPAP but did not tolerate it well. Pt wants to try again later. Pt is now on [facility] with sats of 93%

## 2014-03-18 ENCOUNTER — Encounter (HOSPITAL_COMMUNITY): Payer: Self-pay | Admitting: General Practice

## 2014-03-18 DIAGNOSIS — G8929 Other chronic pain: Secondary | ICD-10-CM

## 2014-03-18 LAB — HIV ANTIBODY (ROUTINE TESTING W REFLEX)
HIV 1/O/2 Abs-Index Value: <1 (ref <1.00)
HIV-1/HIV-2 Ab: NONREACTIVE

## 2014-03-18 LAB — BASIC METABOLIC PANEL
Anion gap: 7 (ref 5–15)
BUN: 29 mg/dL — ABNORMAL HIGH (ref 6–23)
CO2: 32 mmol/L (ref 19–32)
Calcium: 8.6 mg/dL (ref 8.4–10.5)
Chloride: 95 mEq/L — ABNORMAL LOW (ref 96–112)
Creatinine, Ser: 0.75 mg/dL (ref 0.50–1.35)
GFR calc Af Amer: >90 mL/min (ref >90)
GFR calc non Af Amer: >90 mL/min (ref >90)
Glucose, Bld: 99 mg/dL (ref 70–99)
Potassium: 3.8 mmol/L (ref 3.5–5.1)
Sodium: 134 mmol/L — ABNORMAL LOW (ref 135–145)

## 2014-03-18 MED ORDER — IPRATROPIUM-ALBUTEROL 0.5-2.5 (3) MG/3ML IN SOLN
3.0000 mL | Freq: Two times a day (BID) | RESPIRATORY_TRACT | Status: DC
  Administered 2014-03-18 – 2014-03-19 (×2): 3 mL via RESPIRATORY_TRACT
  Filled 2014-03-18 (×2): qty 3

## 2014-03-18 MED ORDER — ALBUTEROL SULFATE (2.5 MG/3ML) 0.083% IN NEBU: 2.5000 mg | INHALATION_SOLUTION | Freq: Four times a day (QID) | RESPIRATORY_TRACT | Status: DC | PRN

## 2014-03-18 MED ORDER — ALBUTEROL SULFATE HFA 108 (90 BASE) MCG/ACT IN AERS: 2.0000 | INHALATION_SPRAY | Freq: Three times a day (TID) | RESPIRATORY_TRACT | Status: DC | PRN

## 2014-03-18 MED ORDER — LORAZEPAM 1 MG PO TABS
1.0000 mg | ORAL_TABLET | Freq: Once | ORAL | Status: AC | PRN
  Administered 2014-03-18: 1 mg via ORAL
  Filled 2014-03-18: qty 1

## 2014-03-18 MED ORDER — ALBUTEROL SULFATE (2.5 MG/3ML) 0.083% IN NEBU: 2.5000 mg | INHALATION_SOLUTION | Freq: Three times a day (TID) | RESPIRATORY_TRACT | Status: DC | PRN

## 2014-03-18 NOTE — Progress Notes (Signed)
PULMONARY / CRITICAL CARE MEDICINE   Name: Clarence Murray MRN: [id number] DOB: 1965/08/12    ADMISSION DATE:  03/16/2014 CONSULTATION DATE:  03/16/14  REFERRING MD :  Dr. Daryll Drown  CHIEF COMPLAINT:  SOB  INITIAL PRESENTATION: 49 year old smoker male with COPD presented to the ED 1/13 for worsening SOB x2 days and hypoxia. ABG on admission 7.3/68.5/61/35/88%. Started on BiPAP in ED and transferred to SDU. Repeat ABG 7.18/95/94/36.1/94%. Drowsy but arousable. Transferred to ICU, possible intubation.   STUDIES:  CT chest without contrast 1/13: hyperinflated lungs with minimal peribronchial thickening. No infiltrate.  PFTs 08/2006 (per prior d/c summary): severe obstruction, FEV1 57%, FEV1/FVC 315, DLCO 41%, VC 57%  SIGNIFICANT EVENTS: 1/13: Admit to teaching service for COPD exacerbation.  Progressive hypercapnia and lethargy on BiPAP, ABG 7.18/95-->transfered to ICU  SUBJECTIVE:  Breathing better.  Denies chest pain.  Did okay with BiPAP overnight.  VITAL SIGNS: Temp:  [97.7 F (36.5 C)-98.3 F (36.8 C)] 97.9 F (36.6 C) (01/15 0800) Pulse Rate:  [68-106] 83 (01/15 0800) Resp:  [9-24] 16 (01/15 0800) BP: (105-139)/(47-103) 136/79 mmHg (01/15 0800) SpO2:  [92 %-100 %] 94 % (01/15 0917) FiO2 (%):  [40 %] 40 % (01/15 0207) INTAKE / OUTPUT:  Intake/Output Summary (Last 24 hours) at 03/18/14 1056 Last data filed at 03/18/14 0800  Gross per 24 hour  Intake    480 ml  Output   1350 ml  Net   -870 ml    PHYSICAL EXAMINATION: General: pleasant Neuro: normal strength HEENT: no sinus tenderness Cardiovascular: regular Lungs: no wheezing Abdomen:  Soft, distended, non-tender to palpation, +bs Musculoskeletal: -edema Skin:  dry  LABS:  CBC  Recent Labs Lab 03/16/14 1212 03/17/14 0815  WBC 7.6 8.6  HGB 14.8 13.4  HCT 45.5 41.3  PLT 251 232   BMET  Recent Labs Lab 03/17/14 0350 03/17/14 0815 03/18/14 0349  NA 136 137 134*  K 4.1 4.0 3.8  CL 94* 90* 95*  CO2 34*  29 32  BUN 18 17 29*  CREATININE 0.75 0.84 0.75  GLUCOSE 152* 141* 99   Electrolytes  Recent Labs Lab 03/17/14 0350 03/17/14 0815 03/18/14 0349  CALCIUM 9.0 9.4 8.6  MG  --  1.8  --    Sepsis Markers  Recent Labs Lab 03/16/14 1337  LATICACIDVEN 0.64   ABG  Recent Labs Lab 03/16/14 1357 03/16/14 1925 03/17/14 0153  PHART 7.315* 7.186* 7.394  PCO2ART 68.5* 95.0* 52.7*  PO2ART 61.0* 94.0 98.0    ASSESSMENT / PLAN:  Acute on chronic hypoxic/hypercapnic respiratory failure 2nd to AECOPD, possible OSA/OHS, and chronic opiate therapy. Plan: Change to auto CPAP at night >> will need outpt sleep study Assess for home oxygen therapy Wean off prednisone as tolerated Scheduled BD's  Chronic pain. Plan: Per primary team  He plans to f/u with PCP in [place].  PCCM will sign off.  Please call if additional help needed while he is in hospital.  Chesley Mires, MD [place] 03/18/2014, 10:56 AM Pager:  [phone] After 3pm call: [phone]

## 2014-03-18 NOTE — Discharge Instructions (Signed)
You will need to have your PCP help you arrange an outpatient sleep study.

## 2014-03-18 NOTE — Progress Notes (Signed)
Respiratory Virus panel negative, will discontinue droplet precautions.   Roxan Hockey, RN

## 2014-03-18 NOTE — Progress Notes (Signed)
Subjective: Pt states breathing has improved, asking when he can go home. Complaining of rt sided neck cramps from sleeping on the hospital bed.   Required ativan last night to help tolerate BIPAP.   Objective: Vital signs in last 24 hours: Filed Vitals:   03/18/14 0600 03/18/14 0700 03/18/14 0800 03/18/14 0917  BP: 123/85 120/72 136/79   Pulse: 68 77 83   Temp:   97.9 F (36.6 C)   TempSrc:   Oral   Resp: $Remo'12 15 16   'obXAn$ Height:      Weight:      SpO2: 99% 100% 92% 94%   Weight change:   Intake/Output Summary (Last 24 hours) at 03/18/14 1132 Last data filed at 03/18/14 0800  Gross per 24 hour  Intake    480 ml  Output   1350 ml  Net   -870 ml   General: NAD Lungs: CTAB, no wheezing, good airmovement Cardiac: RRR, no murmurs GI: soft, active bowel sounds Ext: Neg pedal edema b/l Neuro: CN 2-12 grossly intact  Lab Results: Basic Metabolic Panel:  Recent Labs Lab 03/17/14 0815 03/18/14 0349  NA 137 134*  K 4.0 3.8  CL 90* 95*  CO2 29 32  GLUCOSE 141* 99  BUN 17 29*  CREATININE 0.84 0.75  CALCIUM 9.4 8.6  MG 1.8  --    Liver Function Tests:  Recent Labs Lab 03/16/14 2020  AST 16  ALT 11  ALKPHOS 69  BILITOT 0.7  PROT 7.5  ALBUMIN 4.0   CBC:  Recent Labs Lab 03/16/14 1212 03/17/14 0815  WBC 7.6 8.6  HGB 14.8 13.4  HCT 45.5 41.3  MCV 97.6 98.6  PLT 251 232   Cardiac Enzymes:  Recent Labs Lab 03/16/14 2020 03/17/14 0350 03/17/14 0815  TROPONINI <0.03 <0.03 <0.03   CBG:  Recent Labs Lab 03/16/14 1840  GLUCAP 156*   Urine Drug Screen: Drugs of Abuse     Component Value Date/Time   LABOPIA NONE DETECTED 03/16/2014 2100   COCAINSCRNUR NONE DETECTED 03/16/2014 2100   LABBENZ NONE DETECTED 03/16/2014 2100   AMPHETMU NONE DETECTED 03/16/2014 2100   THCU NONE DETECTED 03/16/2014 2100   LABBARB NONE DETECTED 03/16/2014 2100     Micro Results: Recent Results (from the past 240 hour(s))  Culture, blood (routine x 2)     Status:  None (Preliminary result)   Collection Time: 03/16/14  1:25 PM  Result Value Ref Range Status   Specimen Description BLOOD RIGHT HAND  Final   Special Requests BOTTLES DRAWN AEROBIC AND ANAEROBIC 5CC  Final   Culture   Final           BLOOD CULTURE RECEIVED NO GROWTH TO DATE CULTURE WILL BE HELD FOR 5 DAYS BEFORE ISSUING A FINAL NEGATIVE REPORT Performed at [facility]    Report Status PENDING  Incomplete  Culture, blood (routine x 2)     Status: None (Preliminary result)   Collection Time: 03/16/14  1:30 PM  Result Value Ref Range Status   Specimen Description BLOOD LEFT ANTECUBITAL  Final   Special Requests BOTTLES DRAWN AEROBIC AND ANAEROBIC 5CC  Final   Culture   Final           BLOOD CULTURE RECEIVED NO GROWTH TO DATE CULTURE WILL BE HELD FOR 5 DAYS BEFORE ISSUING A FINAL NEGATIVE REPORT Performed at [facility]    Report Status PENDING  Incomplete  MRSA PCR Screening     Status: None  Collection Time: 03/16/14  5:29 PM  Result Value Ref Range Status   MRSA by PCR NEGATIVE NEGATIVE Final    Comment:        The GeneXpert MRSA Assay (FDA approved for NASAL specimens only), is one component of a comprehensive MRSA colonization surveillance program. It is not intended to diagnose MRSA infection nor to guide or monitor treatment for MRSA infections.   Respiratory virus panel (routine influenza)     Status: None   Collection Time: 03/16/14  8:36 PM  Result Value Ref Range Status   Source - RVPAN Not Provided  Corrected   Respiratory Syncytial Virus A NOT DETECTED  Final   Respiratory Syncytial Virus B NOT DETECTED  Final   Influenza A NOT DETECTED  Final   Influenza B NOT DETECTED  Final   Parainfluenza 1 NOT DETECTED  Final   Parainfluenza 2 NOT DETECTED  Final   Parainfluenza 3 NOT DETECTED  Final   Metapneumovirus NOT DETECTED  Final   Rhinovirus NOT DETECTED  Final   Adenovirus NOT DETECTED  Final   Influenza A H1 NOT DETECTED  Corrected    Influenza A H3 NOT DETECTED  Corrected    Comment: (NOTE)       Normal Reference Range for each Analyte: NOT DETECTED Testing performed using the Luminex xTAG Respiratory Viral Panel test kit. The analytical performance characteristics of this assay have been determined by [facility].  The modifications have not been cleared or approved by the FDA. This assay has been validated pursuant to the CLIA regulations and is used for clinical purposes. Performed at [facility]    Studies/Results: Ct Chest Wo Contrast  03/16/2014   CLINICAL DATA:  COPD exacerbation, shortness of breath, low oxygen saturation  EXAM: CT CHEST WITHOUT CONTRAST  TECHNIQUE: Multidetector CT imaging of the chest was performed following the standard protocol without IV contrast. Sagittal and coronal MPR images reconstructed from axial data set.  COMPARISON:  None  FINDINGS: Aorta normal caliber.  Heart appears enlarged.  Significant atherosclerotic calcifications within coronary arteries.  No thoracic adenopathy.  Atrophic pancreas.  Remaining visualized upper abdomen unremarkable.  Lungs slightly hyperinflated with minimal peribronchial thickening.  No infiltrate, pleural effusion, pneumothorax or mass/ nodule.  Bones demineralized.  IMPRESSION: Changes of COPD.  No acute abnormalities.   Electronically Signed   By: Lavonia Dana M.D.   On: 03/16/2014 16:13   Dg Chest Port 1 View  03/16/2014   CLINICAL DATA:  Shortness of breath.  Low O2 sats.  EXAM: PORTABLE CHEST - 1 VIEW  COMPARISON:  12/30/2013  FINDINGS: Mild cardiomegaly. No overt edema. Increasing atelectasis or infiltrate at the right lung base. No confluent opacity on the left. No effusions. No acute bony abnormality.  IMPRESSION: Cardiomegaly.  Right basilar atelectasis or infiltrate   Electronically Signed   By: Rolm Baptise M.D.   On: 03/16/2014 12:50   Medications: I have reviewed the patient's current medications. Scheduled Meds: . enoxaparin  (LOVENOX) injection  40 mg Subcutaneous Q24H  . ipratropium-albuterol  3 mL Nebulization Q6H  . methadone  110 mg Oral Daily  . predniSONE  30 mg Oral Q breakfast   Continuous Infusions:  PRN Meds:.acetaminophen, ipratropium-albuterol Assessment/Plan: Active Problems:   COPD exacerbation   Chronic pain   Severe COPD exacerbation-- CTA negative for PNA, positive for COPD. - blood cultures NGTD - PCCM signed off (1/15), prednisone 30mg  started today x 5 days (end date 03/22/14) - will need  outpatient polysomnography  - CPAP as needed if O2 sat drop below 88% - RSV panel and HIV neg - duonebs q6h - will have nurse ambulate pt w/ pulse ox to determine home O2 needs  Previoius hx of pain med abuse-- called [facility] in [place] and verified dose. -continue methadone 110mg   Diet-- regular   DVT ppx-- lovenox  Dispo: Discharge tomorrow pending O2 requirements overnight  The patient does have a current PCP (No primary care provider on file.) and does not need an [facility] hospital follow-up appointment after discharge.  The patient does not have transportation limitations that hinder transportation to clinic appointments.  .Services Needed at time of discharge: Y = Yes, Blank = No PT:   OT:   RN:   Equipment:   Other:     LOS: 2 days   Julious Oka, MD 03/18/2014, 11:32 AM

## 2014-03-18 NOTE — Progress Notes (Signed)
Ambulated with patient around unit X2. No complaints of SOB, dizziness, or fatigue.  O2 SATS 88-89% while ambulating on room air. There where a couple of incidents where O2 SATS would very briefly drop to 87% then come right back up, was not sustained at 87%.   Patient tolerated walk well.  Will continue to encourage activity.   Patient remains on room air at this time. SATS at rest 91-92% on room air.    Roxan Hockey, RN

## 2014-03-18 NOTE — Progress Notes (Deleted)
Pt. desated in the 80s while sleeping. I put him on 3L [place]. Pt. is now 59. Will continue to monitor.

## 2014-03-19 DIAGNOSIS — J441 Chronic obstructive pulmonary disease with (acute) exacerbation: Secondary | ICD-10-CM | POA: Insufficient documentation

## 2014-03-19 MED ORDER — BUDESONIDE-FORMOTEROL FUMARATE 80-4.5 MCG/ACT IN AERO: 2.0000 | INHALATION_SPRAY | Freq: Two times a day (BID) | RESPIRATORY_TRACT | Status: DC

## 2014-03-19 MED ORDER — PREDNISONE 10 MG PO TABS: 30.0000 mg | ORAL_TABLET | Freq: Every day | ORAL | Status: DC

## 2014-03-19 MED ORDER — IPRATROPIUM-ALBUTEROL 0.5-2.5 (3) MG/3ML IN SOLN: 3.0000 mL | Freq: Four times a day (QID) | RESPIRATORY_TRACT | Status: DC | PRN

## 2014-03-19 MED ORDER — IPRATROPIUM-ALBUTEROL 18-103 MCG/ACT IN AERO: 1.0000 | INHALATION_SPRAY | Freq: Four times a day (QID) | RESPIRATORY_TRACT | Status: DC | PRN

## 2014-03-19 NOTE — Progress Notes (Signed)
Subjective: Pt was off O2 last night for a while, and desat to 82% asleep, and was back up to 88% when put on 2.5L of O2. Ready to go home. Wife at bedside.  Objective: Vital signs in last 24 hours: Filed Vitals:   03/18/14 2254 03/19/14 0646 03/19/14 0856 03/19/14 0900  BP: 111/76 133/84    Pulse: 72 75    Temp: 98.1 F (36.7 C) 97.6 F (36.4 C)    TempSrc: Oral Oral    Resp: 18 18    Height:      Weight:      SpO2: 99% 93% 90% 90%   Weight change:   Intake/Output Summary (Last 24 hours) at 03/19/14 1217 Last data filed at 03/19/14 0739  Gross per 24 hour  Intake      0 ml  Output    250 ml  Net   -250 ml   General: Looks much better today, On room air. Colour looks good.  Lungs: CTAB, no wheezing, good airmovement Cardiac: Irregular, no murmurs GI: soft, full, active bowel sounds Ext: Neg pedal edema b/l Neuro: No obvious focal deficits.  Lab Results: Basic Metabolic Panel:  Recent Labs Lab 03/17/14 0815 03/18/14 0349  NA 137 134*  K 4.0 3.8  CL 90* 95*  CO2 29 32  GLUCOSE 141* 99  BUN 17 29*  CREATININE 0.84 0.75  CALCIUM 9.4 8.6  MG 1.8  --    Liver Function Tests:  Recent Labs Lab 03/16/14 2020  AST 16  ALT 11  ALKPHOS 69  BILITOT 0.7  PROT 7.5  ALBUMIN 4.0   CBC:  Recent Labs Lab 03/16/14 1212 03/17/14 0815  WBC 7.6 8.6  HGB 14.8 13.4  HCT 45.5 41.3  MCV 97.6 98.6  PLT 251 232   Cardiac Enzymes:  Recent Labs Lab 03/16/14 2020 03/17/14 0350 03/17/14 0815  TROPONINI <0.03 <0.03 <0.03   CBG:  Recent Labs Lab 03/16/14 1840  GLUCAP 156*   Urine Drug Screen: Drugs of Abuse     Component Value Date/Time   LABOPIA NONE DETECTED 03/16/2014 2100   COCAINSCRNUR NONE DETECTED 03/16/2014 2100   LABBENZ NONE DETECTED 03/16/2014 2100   AMPHETMU NONE DETECTED 03/16/2014 2100   THCU NONE DETECTED 03/16/2014 2100   LABBARB NONE DETECTED 03/16/2014 2100     Micro Results: Recent Results (from the past 240 hour(s))    Culture, blood (routine x 2)     Status: None (Preliminary result)   Collection Time: 03/16/14  1:25 PM  Result Value Ref Range Status   Specimen Description BLOOD RIGHT HAND  Final   Special Requests BOTTLES DRAWN AEROBIC AND ANAEROBIC 5CC  Final   Culture   Final           BLOOD CULTURE RECEIVED NO GROWTH TO DATE CULTURE WILL BE HELD FOR 5 DAYS BEFORE ISSUING A FINAL NEGATIVE REPORT Performed at [facility]    Report Status PENDING  Incomplete  Culture, blood (routine x 2)     Status: None (Preliminary result)   Collection Time: 03/16/14  1:30 PM  Result Value Ref Range Status   Specimen Description BLOOD LEFT ANTECUBITAL  Final   Special Requests BOTTLES DRAWN AEROBIC AND ANAEROBIC 5CC  Final   Culture   Final           BLOOD CULTURE RECEIVED NO GROWTH TO DATE CULTURE WILL BE HELD FOR 5 DAYS BEFORE ISSUING A FINAL NEGATIVE REPORT Performed at [facility]  Report Status PENDING  Incomplete  MRSA PCR Screening     Status: None   Collection Time: 03/16/14  5:29 PM  Result Value Ref Range Status   MRSA by PCR NEGATIVE NEGATIVE Final    Comment:        The GeneXpert MRSA Assay (FDA approved for NASAL specimens only), is one component of a comprehensive MRSA colonization surveillance program. It is not intended to diagnose MRSA infection nor to guide or monitor treatment for MRSA infections.   Respiratory virus panel (routine influenza)     Status: None   Collection Time: 03/16/14  8:36 PM  Result Value Ref Range Status   Source - RVPAN Not Provided  Corrected   Respiratory Syncytial Virus A NOT DETECTED  Final   Respiratory Syncytial Virus B NOT DETECTED  Final   Influenza A NOT DETECTED  Final   Influenza B NOT DETECTED  Final   Parainfluenza 1 NOT DETECTED  Final   Parainfluenza 2 NOT DETECTED  Final   Parainfluenza 3 NOT DETECTED  Final   Metapneumovirus NOT DETECTED  Final   Rhinovirus NOT DETECTED  Final   Adenovirus NOT DETECTED  Final    Influenza A H1 NOT DETECTED  Corrected   Influenza A H3 NOT DETECTED  Corrected    Comment: (NOTE)       Normal Reference Range for each Analyte: NOT DETECTED Testing performed using the Luminex xTAG Respiratory Viral Panel test kit. The analytical performance characteristics of this assay have been determined by [facility].  The modifications have not been cleared or approved by the FDA. This assay has been validated pursuant to the CLIA regulations and is used for clinical purposes. Performed at [facility]    Studies/Results: No results found. Medications: I have reviewed the patient's current medications. Scheduled Meds: . enoxaparin (LOVENOX) injection  40 mg Subcutaneous Q24H  . ipratropium-albuterol  3 mL Nebulization BID  . methadone  110 mg Oral Daily  . predniSONE  30 mg Oral Q breakfast   Continuous Infusions:  PRN Meds:.acetaminophen, albuterol Assessment/Plan:  Severe COPD exacerbation-- CTA negative for PNA, positive for COPD. Stable. Has not needed Bipap - blood cultures NGTD - PCCM signed off (1/15), prednisone 30mg  started today x 5 days (end date 03/22/14) - will need outpatient polysomnography - as he desats at night - RSV panel and HIV neg - duonebs q6h - Order home O2. - Prednisone on discharge.  - Follow up with PCP on a week  Previous hx of pain med abuse-- called [facility] in [place] and verified dose. -continue methadone 110mg   Diet-- regular   DVT ppx-- lovenox  Dispo: Discharge today   LOS: 3 days   Bethena Roys, MD 03/19/2014, 12:17 PM

## 2014-03-19 NOTE — Progress Notes (Signed)
RN alerted that patient's Sp02 sat 82%, RN to assess patient and patient had taken off his oxygen. Pt stated "I wanted to see my levels without my oxygen on". Pt placed back on 2.5l 02 Nasal Cannula, and patient's Sp02 saturations 88%. Education provided to patient about oxygen and the need to keep his oxygen on while at rest and while ambulating.

## 2014-03-19 NOTE — Progress Notes (Signed)
Vilma Meckel to be D/C'd Home per MD order.  Discussed with the patient and all questions fully answered.    Medication List    STOP taking these medications        albuterol (2.5 MG/3ML) 0.083% nebulizer solution  Commonly known as:  PROVENTIL     albuterol 108 (90 BASE) MCG/ACT inhaler  Commonly known as:  PROVENTIL HFA;VENTOLIN HFA     Fluticasone Furoate-Vilanterol 100-25 MCG/INH Aepb      TAKE these medications        budesonide-formoterol 80-4.5 MCG/ACT inhaler  Commonly known as:  SYMBICORT  Inhale 2 puffs into the lungs 2 (two) times daily.     esomeprazole 40 MG capsule  Commonly known as:  NEXIUM  Take 40 mg by mouth daily at 12 noon.     ipratropium-albuterol 0.5-2.5 (3) MG/3ML Soln  Commonly known as:  DUONEB  Take 3 mLs by nebulization every 6 (six) hours as needed.     albuterol-ipratropium 18-103 MCG/ACT inhaler  Commonly known as:  COMBIVENT  Inhale 1-2 puffs into the lungs every 6 (six) hours as needed for wheezing or shortness of breath.     methadone 10 MG/ML solution  Commonly known as:  DOLOPHINE  Take 110 mg by mouth daily.     predniSONE 10 MG tablet  Commonly known as:  DELTASONE  Take 3 tablets (30 mg total) by mouth daily with breakfast.        VVS, Skin clean, dry and intact without evidence of skin break down, no evidence of skin tears noted. IV catheter discontinued intact. Site without signs and symptoms of complications. Dressing and pressure applied.  An After Visit Summary was printed and given to the patient.  Home oxygen take given to patient  D/c education completed with patient/family including follow up instructions, medication list, d/c activities limitations if indicated, with other d/c instructions as indicated by MD - patient able to verbalize understanding, all questions fully answered.   Patient instructed to return to ED, call 911, or call MD for any changes in condition.   Patient escorted via [place], and D/C home  via private auto.  Delman Cheadle 03/19/2014 2:58 PM

## 2014-03-19 NOTE — Progress Notes (Signed)
Checked on pt. First rounds around 21:50 & SAT's were in the 90's. Pt. & order states to place pt. On CPAP if SAT's are below 88%. Pt. Is aware that the order is in & he will call if he feels like he needs to be placed on CPAP.

## 2014-03-19 NOTE — Progress Notes (Signed)
  Date: 03/19/2014  Patient name: SATCHEL HEIDINGER  Medical record number: [id number]  Date of birth: [birth year]   This patient's plan of care was discussed with the house staff. Please see Dr. Talmadge Coventry note for complete details. I concur with her findings.  Patient is ready for discharge today.    Sid Falcon, MD 03/19/2014, 12:38 PM

## 2014-03-19 NOTE — Progress Notes (Addendum)
Patient's Sp02 sensor moved to earlobe where pt stated it was placed on previous unit. Sensor moved to earlobe and Sp02 reading 90% on room air at rest. On ambulation patients Sp02 sat dropped to 86% on room air, pt placed on 1L nasal cannula and 02 saturations 88%. Pt tolerated ambulation well no complaints of shortness of breath  SATURATION QUALIFICATIONS: (This note is used to comply with regulatory documentation for home oxygen)  Patient Saturations on Room Air at Rest = 90%  Patient Saturations on Room Air while Ambulating = 86%  Patient Saturations on 2 Liters of oxygen while Ambulating = 90%  Please briefly explain why patient needs home oxygen: desaturation while ambulating on room air

## 2014-03-20 NOTE — Discharge Summary (Signed)
Name: Clarence Murray MRN: [id number] DOB: Feb 07, 1966 49 y.o. PCP: No primary care provider on file.  Date of Admission: 03/16/2014 11:50 AM Date of Discharge: 03/19/2014 Attending Physician: Dr. Daryll Drown  Discharge Diagnosis: Active Problems:   COPD exacerbation   Chronic pain   Chronic obstructive pulmonary disease with acute exacerbation  Discharge Medications:   Medication List    STOP taking these medications        albuterol (2.5 MG/3ML) 0.083% nebulizer solution  Commonly known as:  PROVENTIL     albuterol 108 (90 BASE) MCG/ACT inhaler  Commonly known as:  PROVENTIL HFA;VENTOLIN HFA     Fluticasone Furoate-Vilanterol 100-25 MCG/INH Aepb      TAKE these medications        budesonide-formoterol 80-4.5 MCG/ACT inhaler  Commonly known as:  SYMBICORT  Inhale 2 puffs into the lungs 2 (two) times daily.     esomeprazole 40 MG capsule  Commonly known as:  NEXIUM  Take 40 mg by mouth daily at 12 noon.     ipratropium-albuterol 0.5-2.5 (3) MG/3ML Soln  Commonly known as:  DUONEB  Take 3 mLs by nebulization every 6 (six) hours as needed.     albuterol-ipratropium 18-103 MCG/ACT inhaler  Commonly known as:  COMBIVENT  Inhale 1-2 puffs into the lungs every 6 (six) hours as needed for wheezing or shortness of breath.     methadone 10 MG/ML solution  Commonly known as:  DOLOPHINE  Take 110 mg by mouth daily.     predniSONE 10 MG tablet  Commonly known as:  DELTASONE  Take 3 tablets (30 mg total) by mouth daily with breakfast.        Disposition and follow-up:   Clarence Murray was discharged from [facility] in Stable condition.  At the hospital follow up visit please address:  1.  Please help set pt up for outpatient sleep study.  2.  Labs / imaging needed at time of follow-up: none  3.  Pending labs/ test needing follow-up: blood cultures from 1/13, currently NGTD  Follow-up Appointments:     Follow-up Information    Follow up with  [facility].   Why:  home health oxygen   Contact information:   [address] [place] [address] [phone]       Schedule an appointment as soon as possible for a visit on 03/31/2014 to follow up.   Why:  Please follow up with your PCP in 1-2 weeks.      Discharge Instructions: Discharge Instructions    DME Nebulizer machine    Complete by:  As directed      Diet - low sodium heart healthy    Complete by:  As directed      Discharge instructions    Complete by:  As directed   Please use your Oxygen every night when you sleep, also when you are moving around, or doing some activity.  We have prescribed a nebulizer solution for you. Use it as needed when you become short of breath. Also use your Symbicort inhaler twice a day  Everyday.   Also we have prescribed a steroid medication, take 3 tablets everyday for 3 days.   Its is important you avoid cigarettes even people around you who smoke.   Please follow up with your PCP in 1-2 weeks.     Increase activity slowly    Complete by:  As directed  Consultations:  PCCM  Procedures Performed:  Ct Chest Wo Contrast  03/16/2014   CLINICAL DATA:  COPD exacerbation, shortness of breath, low oxygen saturation  EXAM: CT CHEST WITHOUT CONTRAST  TECHNIQUE: Multidetector CT imaging of the chest was performed following the standard protocol without IV contrast. Sagittal and coronal MPR images reconstructed from axial data set.  COMPARISON:  None  FINDINGS: Aorta normal caliber.  Heart appears enlarged.  Significant atherosclerotic calcifications within coronary arteries.  No thoracic adenopathy.  Atrophic pancreas.  Remaining visualized upper abdomen unremarkable.  Lungs slightly hyperinflated with minimal peribronchial thickening.  No infiltrate, pleural effusion, pneumothorax or mass/ nodule.  Bones demineralized.  IMPRESSION: Changes of COPD.  No acute abnormalities.   Electronically Signed   By: Lavonia Dana M.D.   On: 03/16/2014 16:13   Dg Chest Port 1 View  03/16/2014   CLINICAL DATA:  Shortness of breath.  Low O2 sats.  EXAM: PORTABLE CHEST - 1 VIEW  COMPARISON:  12/30/2013  FINDINGS: Mild cardiomegaly. No overt edema. Increasing atelectasis or infiltrate at the right lung base. No confluent opacity on the left. No effusions. No acute bony abnormality.  IMPRESSION: Cardiomegaly.  Right basilar atelectasis or infiltrate   Electronically Signed   By: Rolm Baptise M.D.   On: 03/16/2014 12:50      Admission HPI: Pt is a 49 y/o male w/ PMHx of COPD and chronic pain who presented to the ED w/ SOB. Pt was seen in step down w/ BIPAP on due to desaturations to the 70's. Thus HPI obtained from chart review. Pt started having SOB last night and called EMS but refused to go to the hospital. Pt was seen by his PCP this morning and he was noted to be hypoxic and had lower extremity edema. He was then sent to the ED. In the ED pt was noted to have O2 sat of 78% on room air on admission w/ improvement to the 90's w/ 2L [place]. ABG was performed and revealed CO2 of 68.5 and he was then started on BIPAP, repeat ABG during exam revealed CO2 of 100 w/ BIPAP.   Hospital Course by problem list: Active Problems:   COPD exacerbation   Chronic pain   Chronic obstructive pulmonary disease with acute exacerbation   COPD exacerbation-- On admission ABG noted high CO2 while in the ED and patient was placed on BIPAP. On physical exam on admission pt was in respiratory distress and repeat ABG revealed CO2 of 95. PCCM monitored pt overnight incase pt required intubation. He was given IV steroids and nebulizers. Pt however did not require intubation and was transferred back to IMTS the next day. He was then started on prednisone 30mg  x 5 days (end date 03/22/14). Breathing improved and he was satting well on 2L [place] at rest. On ambulation O2 sats dropped to 88-89% on room air and O2 sats at rest was 91-92% on room air. RSV and HIV  neg. Pt was d/c'd home with home oxygen tank.   Previous history of pain medication abuse-- called [facility] in [place] and verified methadone dose 110mg  daily which was continue during hospital course.   Discharge Vitals:   BP 133/84 mmHg  Pulse 75  Temp(Src) 97.6 F (36.4 C) (Oral)  Resp 18  Ht 6' (1.829 m)  Wt 195 lb 8.8 oz (88.7 kg)  BMI 26.52 kg/m2  SpO2 90%  Discharge Labs:  Results for Sanko, DUANNE DUCHESNE (MRN [id number]) as of 03/20/2014 10:27  Ref. Range 03/18/2014  03:49  Sodium Latest Range: 135-145 mmol/L 134 (L)  Potassium Latest Range: 3.5-5.1 mmol/L 3.8  Chloride Latest Range: 96-112 mEq/L 95 (L)  CO2 Latest Range: 19-32 mmol/L 32  BUN Latest Range: 6-23 mg/dL 29 (H)  Creatinine Latest Range: 0.50-1.35 mg/dL 0.75  Calcium Latest Range: 8.4-10.5 mg/dL 8.6  GFR calc non Af Amer Latest Range: >90 mL/min >90  GFR calc Af Amer Latest Range: >90 mL/min >90  Glucose Latest Range: 70-99 mg/dL 99  Anion gap Latest Range: 5-15  7     Signed: Julious Oka, MD 03/20/2014, 7:22 AM    Services Ordered on Discharge: none Equipment Ordered on Discharge: home oxygen

## 2014-03-22 LAB — CULTURE, BLOOD (ROUTINE X 2)
CULTURE: NO GROWTH
Culture: NO GROWTH

## 2014-06-02 ENCOUNTER — Ambulatory Visit (HOSPITAL_BASED_OUTPATIENT_CLINIC_OR_DEPARTMENT_OTHER): Payer: 59

## 2014-06-08 ENCOUNTER — Ambulatory Visit (HOSPITAL_BASED_OUTPATIENT_CLINIC_OR_DEPARTMENT_OTHER): Payer: 59 | Attending: Internal Medicine | Admitting: Radiology

## 2014-06-08 VITALS — Ht 72.0 in | Wt 205.0 lb

## 2014-06-08 DIAGNOSIS — G4710 Hypersomnia, unspecified: Secondary | ICD-10-CM | POA: Diagnosis present

## 2014-06-08 DIAGNOSIS — R0683 Snoring: Secondary | ICD-10-CM | POA: Diagnosis not present

## 2014-06-08 DIAGNOSIS — G4730 Sleep apnea, unspecified: Secondary | ICD-10-CM

## 2014-06-08 DIAGNOSIS — G4733 Obstructive sleep apnea (adult) (pediatric): Secondary | ICD-10-CM | POA: Diagnosis not present

## 2014-06-24 NOTE — Sleep Study (Signed)
   NAME: Clarence Murray DATE OF BIRTH:  [birth year] MEDICAL RECORD NUMBER [id number]  LOCATION: [facility]  PHYSICIAN: Tally Mattox D  DATE OF STUDY: 06/08/2014  SLEEP STUDY TYPE: Daytime Polysomnogram               REFERRING PHYSICIAN: Raelyn Number, MD  INDICATION FOR STUDY: Hypersomnia with sleep apnea  EPWORTH SLEEPINESS SCORE:   3/24 HEIGHT: 6' (182.9 cm)  WEIGHT: 205 lb (92.987 kg)    Body mass index is 27.8 kg/(m^2).  NECK SIZE: 17 in.  MEDICATIONS: Charted for review  SLEEP ARCHITECTURE: Total sleep time 331.5 minutes with sleep efficiency 91.7%. Stage I was 7.7%, stage II 86.6%, stage III absent, REM 5.7% of total sleep time. Sleep latency 12 minutes, REM latency 205.5 minutes, awake after sleep onset 19 minutes, arousal index 30.2, bedtime medication: None  RESPIRATORY DATA: Apnea hypopnea index (AHI) 22.3 per hour. 123 total events scored including 4 obstructive apneas, 1 central apnea, 118 hypopneas. Most events were while supine. REM AHI 44.2 per hour. A polysomnogram protocol was requested, without CPAP.  OXYGEN DATA: Very loud snoring with oxygen desaturation to a nadir of 73% and mean saturation 89.2% through the study. Room air oxygen saturation on arrival while awake and upright was 89%. Supplemental oxygen was added at 1 L/m by nasal prongs at 10:48 AM because of persistent low saturation.  CARDIAC DATA: Sinus rhythm with occasional PAC, PVC and sinus pause  MOVEMENT/PARASOMNIA: A few limb jerks were noted throughout the study with insignificant effect on sleep. No bathroom trips. One episode of sleep talking/ somniloquy was recorded.  IMPRESSION/ RECOMMENDATION:   1) This study was recorded during the daytime to fit the patient's usual sleep schedule. Lights out at 9:47 AM and lights on at 15:49 PM  2) Moderate obstructive sleep apnea/hypopnea syndrome, AHI 22.3 per hour with events more common while supine. REM AHI 44.2 per hour. Very loud  snoring with oxygen desaturation to a nadir of 73%. Mean oxygen saturation 89.2% through this study with supplemental oxygen at 1 L/m at by the technician per protocol at 10:48 AM room air oxygen saturation on arrival while upright and awake was 89%. The patient gave history of COPD. 3) This study was performed as a diagnostic polysomnogram. The patient can return for a dedicated CPAP titration study if appropriate.  Deneise Lever Diplomate, American Board of Sleep Medicine  ELECTRONICALLY SIGNED ON:  06/24/2014, 8:33 PM [place] PH: [phone]   FX: [phone] [place]

## 2014-07-27 ENCOUNTER — Encounter (HOSPITAL_BASED_OUTPATIENT_CLINIC_OR_DEPARTMENT_OTHER): Payer: 59

## 2015-09-02 HISTORY — PX: UMBILICAL HERNIA REPAIR: SHX196

## 2015-11-30 ENCOUNTER — Ambulatory Visit: Payer: Self-pay | Admitting: General Surgery

## 2016-08-23 ENCOUNTER — Other Ambulatory Visit: Payer: Self-pay | Admitting: General Surgery

## 2016-08-23 DIAGNOSIS — Z09 Encounter for follow-up examination after completed treatment for conditions other than malignant neoplasm: Secondary | ICD-10-CM

## 2016-08-30 ENCOUNTER — Ambulatory Visit
Admission: RE | Admit: 2016-08-30 | Discharge: 2016-08-30 | Disposition: A | Discharge status: Home / Self Care | Payer: 59 | Source: Ambulatory Visit | Attending: General Surgery | Admitting: General Surgery

## 2016-08-30 DIAGNOSIS — Z09 Encounter for follow-up examination after completed treatment for conditions other than malignant neoplasm: Secondary | ICD-10-CM

## 2016-08-30 MED ORDER — IOPAMIDOL (ISOVUE-300) INJECTION 61%
100.0000 mL | Freq: Once | INTRAVENOUS | Status: AC | PRN
  Administered 2016-08-30: 100 mL via INTRAVENOUS

## 2016-09-19 ENCOUNTER — Encounter (HOSPITAL_BASED_OUTPATIENT_CLINIC_OR_DEPARTMENT_OTHER): Payer: Self-pay | Admitting: *Deleted

## 2016-09-23 ENCOUNTER — Encounter (HOSPITAL_BASED_OUTPATIENT_CLINIC_OR_DEPARTMENT_OTHER): Payer: Self-pay | Admitting: *Deleted

## 2016-09-26 ENCOUNTER — Ambulatory Visit: Payer: Self-pay | Admitting: General Surgery

## 2016-09-26 ENCOUNTER — Encounter (HOSPITAL_BASED_OUTPATIENT_CLINIC_OR_DEPARTMENT_OTHER): Payer: Self-pay | Admitting: *Deleted

## 2016-09-26 NOTE — Progress Notes (Signed)
NPO AFTER MN.  ARRIVE AT [id number].  NEEDS HG.  WILL TAKE AM MEDS / INHALERS AND NEXIUM AM DOS W/ SIPS OF WATER.  PT VERBALIZED UNDERSTANDING TO DO HIBICLENS SHOWER HS BEFORE AND AM DOS.

## 2016-10-02 ENCOUNTER — Encounter (HOSPITAL_BASED_OUTPATIENT_CLINIC_OR_DEPARTMENT_OTHER): Payer: Self-pay | Admitting: Anesthesiology

## 2016-10-02 ENCOUNTER — Ambulatory Visit (HOSPITAL_BASED_OUTPATIENT_CLINIC_OR_DEPARTMENT_OTHER): Payer: 59 | Admitting: Anesthesiology

## 2016-10-02 ENCOUNTER — Ambulatory Visit (HOSPITAL_BASED_OUTPATIENT_CLINIC_OR_DEPARTMENT_OTHER)
Admission: RE | Admit: 2016-10-02 | Discharge: 2016-10-02 | Disposition: A | Discharge status: Home / Self Care | Payer: 59 | Source: Ambulatory Visit | Attending: General Surgery | Admitting: General Surgery

## 2016-10-02 ENCOUNTER — Encounter (HOSPITAL_BASED_OUTPATIENT_CLINIC_OR_DEPARTMENT_OTHER)
Admission: RE | Disposition: A | Discharge status: Home / Self Care | Payer: Self-pay | Source: Ambulatory Visit | Attending: General Surgery

## 2016-10-02 DIAGNOSIS — Z79899 Other long term (current) drug therapy: Secondary | ICD-10-CM | POA: Insufficient documentation

## 2016-10-02 DIAGNOSIS — K4090 Unilateral inguinal hernia, without obstruction or gangrene, not specified as recurrent: Secondary | ICD-10-CM | POA: Diagnosis not present

## 2016-10-02 DIAGNOSIS — G4733 Obstructive sleep apnea (adult) (pediatric): Secondary | ICD-10-CM | POA: Diagnosis not present

## 2016-10-02 DIAGNOSIS — K219 Gastro-esophageal reflux disease without esophagitis: Secondary | ICD-10-CM | POA: Insufficient documentation

## 2016-10-02 DIAGNOSIS — Z87891 Personal history of nicotine dependence: Secondary | ICD-10-CM | POA: Diagnosis not present

## 2016-10-02 DIAGNOSIS — D176 Benign lipomatous neoplasm of spermatic cord: Secondary | ICD-10-CM | POA: Diagnosis not present

## 2016-10-02 DIAGNOSIS — I2510 Atherosclerotic heart disease of native coronary artery without angina pectoris: Secondary | ICD-10-CM | POA: Insufficient documentation

## 2016-10-02 DIAGNOSIS — J449 Chronic obstructive pulmonary disease, unspecified: Secondary | ICD-10-CM | POA: Diagnosis not present

## 2016-10-02 DIAGNOSIS — G8929 Other chronic pain: Secondary | ICD-10-CM | POA: Diagnosis not present

## 2016-10-02 DIAGNOSIS — F1111 Opioid abuse, in remission: Secondary | ICD-10-CM | POA: Insufficient documentation

## 2016-10-02 HISTORY — DX: Unilateral inguinal hernia, without obstruction or gangrene, not specified as recurrent: K40.90

## 2016-10-02 HISTORY — PX: INSERTION OF MESH: SHX5868

## 2016-10-02 HISTORY — PX: INGUINAL HERNIA REPAIR: SHX194

## 2016-10-02 HISTORY — DX: Obstructive sleep apnea (adult) (pediatric): G47.33

## 2016-10-02 HISTORY — DX: Personal history of other infectious and parasitic diseases: Z86.19

## 2016-10-02 HISTORY — DX: Opioid abuse, in remission: F11.11

## 2016-10-02 HISTORY — DX: Gastro-esophageal reflux disease without esophagitis: K21.9

## 2016-10-02 LAB — POCT HEMOGLOBIN-HEMACUE: HEMOGLOBIN: 13.1 g/dL (ref 13.0–17.0)

## 2016-10-02 SURGERY — REPAIR, HERNIA, INGUINAL, ADULT
Anesthesia: Regional | Site: Inguinal | Laterality: Left

## 2016-10-02 MED ORDER — MIDAZOLAM HCL 2 MG/2ML IJ SOLN
INTRAMUSCULAR | Status: AC
  Filled 2016-10-02: qty 2

## 2016-10-02 MED ORDER — ROCURONIUM BROMIDE 50 MG/5ML IV SOSY
PREFILLED_SYRINGE | INTRAVENOUS | Status: AC
  Filled 2016-10-02: qty 5

## 2016-10-02 MED ORDER — FENTANYL CITRATE (PF) 100 MCG/2ML IJ SOLN
100.0000 ug | Freq: Once | INTRAMUSCULAR | Status: AC
Start: 2016-10-02 — End: 2016-10-02
  Administered 2016-10-02: 100 ug via INTRAVENOUS
  Filled 2016-10-02: qty 2

## 2016-10-02 MED ORDER — OXYCODONE HCL 5 MG/5ML PO SOLN
5.0000 mg | Freq: Once | ORAL | Status: DC | PRN
  Filled 2016-10-02: qty 5

## 2016-10-02 MED ORDER — LIDOCAINE 2% (20 MG/ML) 5 ML SYRINGE
INTRAMUSCULAR | Status: DC | PRN
  Administered 2016-10-02: 60 mg via INTRAVENOUS

## 2016-10-02 MED ORDER — FENTANYL CITRATE (PF) 100 MCG/2ML IJ SOLN
INTRAMUSCULAR | Status: AC
  Filled 2016-10-02: qty 4

## 2016-10-02 MED ORDER — FENTANYL CITRATE (PF) 100 MCG/2ML IJ SOLN
INTRAMUSCULAR | Status: DC | PRN
Start: 2016-10-02 — End: 2016-10-02
  Administered 2016-10-02 (×2): 50 ug via INTRAVENOUS
  Administered 2016-10-02: 100 ug via INTRAVENOUS

## 2016-10-02 MED ORDER — BUPIVACAINE LIPOSOME 1.3 % IJ SUSP
INTRAMUSCULAR | Status: DC | PRN
  Administered 2016-10-02: 20 mL

## 2016-10-02 MED ORDER — SUCCINYLCHOLINE CHLORIDE 20 MG/ML IJ SOLN
INTRAMUSCULAR | Status: DC | PRN
  Administered 2016-10-02: 100 mg via INTRAVENOUS

## 2016-10-02 MED ORDER — SUGAMMADEX SODIUM 200 MG/2ML IV SOLN
INTRAVENOUS | Status: AC
  Filled 2016-10-02: qty 2

## 2016-10-02 MED ORDER — OXYCODONE HCL 10 MG PO TABS: 10.0000 mg | ORAL_TABLET | ORAL | 0 refills | Status: DC | PRN

## 2016-10-02 MED ORDER — PROPOFOL 10 MG/ML IV BOLUS
INTRAVENOUS | Status: AC
  Filled 2016-10-02: qty 20

## 2016-10-02 MED ORDER — ARTIFICIAL TEARS OPHTHALMIC OINT
TOPICAL_OINTMENT | OPHTHALMIC | Status: AC
  Filled 2016-10-02: qty 3.5

## 2016-10-02 MED ORDER — CELECOXIB 200 MG PO CAPS
ORAL_CAPSULE | ORAL | Status: AC
  Filled 2016-10-02: qty 2

## 2016-10-02 MED ORDER — ROPIVACAINE HCL 5 MG/ML IJ SOLN
INTRAMUSCULAR | Status: DC | PRN
  Administered 2016-10-02: 30 mL via PERINEURAL

## 2016-10-02 MED ORDER — SUGAMMADEX SODIUM 200 MG/2ML IV SOLN
INTRAVENOUS | Status: DC | PRN
  Administered 2016-10-02: 200 mg via INTRAVENOUS

## 2016-10-02 MED ORDER — HYDROCODONE-ACETAMINOPHEN 5-325 MG PO TABS: 1.0000 | ORAL_TABLET | Freq: Four times a day (QID) | ORAL | 0 refills | Status: DC | PRN

## 2016-10-02 MED ORDER — CHLORHEXIDINE GLUCONATE CLOTH 2 % EX PADS
6.0000 | MEDICATED_PAD | Freq: Once | CUTANEOUS | Status: DC
  Filled 2016-10-02: qty 6

## 2016-10-02 MED ORDER — OXYCODONE HCL 5 MG PO TABS
5.0000 mg | ORAL_TABLET | Freq: Once | ORAL | Status: DC | PRN
  Filled 2016-10-02: qty 1

## 2016-10-02 MED ORDER — ROCURONIUM BROMIDE 10 MG/ML (PF) SYRINGE
PREFILLED_SYRINGE | INTRAVENOUS | Status: DC | PRN
  Administered 2016-10-02: 10 mg via INTRAVENOUS
  Administered 2016-10-02: 35 mg via INTRAVENOUS

## 2016-10-02 MED ORDER — PROPOFOL 10 MG/ML IV BOLUS
INTRAVENOUS | Status: DC | PRN
  Administered 2016-10-02: 100 mg via INTRAVENOUS
  Administered 2016-10-02: 300 mg via INTRAVENOUS

## 2016-10-02 MED ORDER — LACTATED RINGERS IV SOLN
INTRAVENOUS | Status: DC
  Administered 2016-10-02 (×2): via INTRAVENOUS
  Filled 2016-10-02: qty 1000

## 2016-10-02 MED ORDER — ACETAMINOPHEN 500 MG PO TABS
1000.0000 mg | ORAL_TABLET | ORAL | Status: AC
  Administered 2016-10-02: 1000 mg via ORAL
  Filled 2016-10-02: qty 2

## 2016-10-02 MED ORDER — IBUPROFEN 800 MG PO TABS: 800.0000 mg | ORAL_TABLET | Freq: Three times a day (TID) | ORAL | 0 refills | Status: DC | PRN

## 2016-10-02 MED ORDER — HYDROMORPHONE HCL 1 MG/ML IJ SOLN
0.2500 mg | INTRAMUSCULAR | Status: DC | PRN
  Filled 2016-10-02: qty 0.5

## 2016-10-02 MED ORDER — ACETAMINOPHEN 10 MG/ML IV SOLN
INTRAVENOUS | Status: AC
  Filled 2016-10-02: qty 100

## 2016-10-02 MED ORDER — CELECOXIB 400 MG PO CAPS
400.0000 mg | ORAL_CAPSULE | ORAL | Status: AC
  Administered 2016-10-02: 400 mg via ORAL
  Filled 2016-10-02: qty 1

## 2016-10-02 MED ORDER — DEXAMETHASONE SODIUM PHOSPHATE 4 MG/ML IJ SOLN
INTRAMUSCULAR | Status: DC | PRN
  Administered 2016-10-02: 10 mg via INTRAVENOUS

## 2016-10-02 MED ORDER — MIDAZOLAM HCL 5 MG/5ML IJ SOLN
INTRAMUSCULAR | Status: DC | PRN
  Administered 2016-10-02: 2 mg via INTRAVENOUS

## 2016-10-02 MED ORDER — PROMETHAZINE HCL 25 MG/ML IJ SOLN
6.2500 mg | INTRAMUSCULAR | Status: DC | PRN
  Filled 2016-10-02: qty 1

## 2016-10-02 MED ORDER — FENTANYL CITRATE (PF) 100 MCG/2ML IJ SOLN
INTRAMUSCULAR | Status: AC
  Filled 2016-10-02: qty 2

## 2016-10-02 MED ORDER — BUPIVACAINE HCL 0.5 % IJ SOLN
INTRAMUSCULAR | Status: DC | PRN
  Administered 2016-10-02: 30 mL

## 2016-10-02 MED ORDER — GABAPENTIN 300 MG PO CAPS
300.0000 mg | ORAL_CAPSULE | ORAL | Status: AC
  Administered 2016-10-02: 300 mg via ORAL
  Filled 2016-10-02: qty 1

## 2016-10-02 MED ORDER — KETOROLAC TROMETHAMINE 30 MG/ML IJ SOLN
INTRAMUSCULAR | Status: DC | PRN
  Administered 2016-10-02: 30 mg via INTRAVENOUS

## 2016-10-02 MED ORDER — CEFAZOLIN SODIUM-DEXTROSE 2-4 GM/100ML-% IV SOLN
2.0000 g | INTRAVENOUS | Status: AC
  Administered 2016-10-02: 2 g via INTRAVENOUS
  Filled 2016-10-02: qty 100

## 2016-10-02 MED ORDER — LIDOCAINE 2% (20 MG/ML) 5 ML SYRINGE
INTRAMUSCULAR | Status: AC
  Filled 2016-10-02: qty 5

## 2016-10-02 MED ORDER — MIDAZOLAM HCL 2 MG/2ML IJ SOLN
2.0000 mg | Freq: Once | INTRAMUSCULAR | Status: AC
  Administered 2016-10-02: 2 mg via INTRAVENOUS
  Filled 2016-10-02: qty 2

## 2016-10-02 MED ORDER — ONDANSETRON HCL 4 MG/2ML IJ SOLN
INTRAMUSCULAR | Status: DC | PRN
  Administered 2016-10-02: 4 mg via INTRAVENOUS

## 2016-10-02 MED ORDER — ACETAMINOPHEN 500 MG PO TABS
ORAL_TABLET | ORAL | Status: AC
  Filled 2016-10-02: qty 2

## 2016-10-02 MED ORDER — CEFAZOLIN SODIUM-DEXTROSE 2-4 GM/100ML-% IV SOLN
INTRAVENOUS | Status: AC
  Filled 2016-10-02: qty 100

## 2016-10-02 MED ORDER — GABAPENTIN 300 MG PO CAPS
ORAL_CAPSULE | ORAL | Status: AC
  Filled 2016-10-02: qty 1

## 2016-10-02 SURGICAL SUPPLY — 48 items
ADH SKN CLS APL DERMABOND .7 (GAUZE/BANDAGES/DRESSINGS) ×1
BLADE CLIPPER SENSICLIP SURGIC (BLADE) ×2 IMPLANT
BLADE HEX COATED 2.75 (ELECTRODE) ×2 IMPLANT
BLADE SURG 15 STRL LF DISP TIS (BLADE) ×1 IMPLANT
BLADE SURG 15 STRL SS (BLADE) ×2
CANISTER SUCT 3000ML PPV (MISCELLANEOUS) ×1 IMPLANT
CELLS DAT CNTRL 66122 CELL SVR (MISCELLANEOUS) IMPLANT
CHLORAPREP W/TINT 26ML (MISCELLANEOUS) ×2 IMPLANT
COVER BACK TABLE 60X90IN (DRAPES) ×2 IMPLANT
COVER MAYO STAND STRL (DRAPES) ×2 IMPLANT
DECANTER SPIKE VIAL GLASS SM (MISCELLANEOUS) IMPLANT
DERMABOND ADVANCED (GAUZE/BANDAGES/DRESSINGS) ×1
DERMABOND ADVANCED .7 DNX12 (GAUZE/BANDAGES/DRESSINGS) ×1 IMPLANT
DRAIN PENROSE 18X1/4 LTX STRL (WOUND CARE) ×1 IMPLANT
DRAPE LAPAROSCOPIC ABDOMINAL (DRAPES) ×2 IMPLANT
DRAPE UTILITY XL STRL (DRAPES) ×2 IMPLANT
ELECT REM PT RETURN 9FT ADLT (ELECTROSURGICAL) ×2
ELECTRODE REM PT RTRN 9FT ADLT (ELECTROSURGICAL) ×1 IMPLANT
GLOVE BIOGEL PI IND STRL 7.0 (GLOVE) ×1 IMPLANT
GLOVE BIOGEL PI INDICATOR 7.0 (GLOVE) ×1
GLOVE SURG SS PI 7.0 STRL IVOR (GLOVE) ×2 IMPLANT
GOWN STRL REUS W/ TWL LRG LVL3 (GOWN DISPOSABLE) ×2 IMPLANT
GOWN STRL REUS W/TWL LRG LVL3 (GOWN DISPOSABLE) ×4
KIT RM TURNOVER CYSTO AR (KITS) ×2 IMPLANT
MESH BARD SOFT 3X6IN (Mesh General) ×1 IMPLANT
NDL HYPO 25X1 1.5 SAFETY (NEEDLE) ×1 IMPLANT
NEEDLE HYPO 25X1 1.5 SAFETY (NEEDLE) ×2 IMPLANT
NS IRRIG 500ML POUR BTL (IV SOLUTION) ×2 IMPLANT
PACK BASIN DAY SURGERY FS (CUSTOM PROCEDURE TRAY) ×2 IMPLANT
PAD ARMBOARD 7.5X6 YLW CONV (MISCELLANEOUS) ×2 IMPLANT
PENCIL BUTTON HOLSTER BLD 10FT (ELECTRODE) ×2 IMPLANT
RETRACTOR WND ALEXIS 18 MED (MISCELLANEOUS) IMPLANT
RTRCTR WOUND ALEXIS 18CM MED (MISCELLANEOUS)
SPONGE LAP 4X18 X RAY DECT (DISPOSABLE) IMPLANT
SUT MNCRL AB 4-0 PS2 18 (SUTURE) ×2 IMPLANT
SUT PROLENE 2 0 CT2 30 (SUTURE) ×5 IMPLANT
SUT SILK 3 0 TIES 17X18 (SUTURE)
SUT SILK 3-0 18XBRD TIE BLK (SUTURE) IMPLANT
SUT VIC AB 2-0 SH 27 (SUTURE) ×10
SUT VIC AB 2-0 SH 27X BRD (SUTURE) ×2 IMPLANT
SUT VIC AB 2-0 SH 27XBRD (SUTURE) ×1 IMPLANT
SUT VIC AB 3-0 SH 27 (SUTURE) ×2
SUT VIC AB 3-0 SH 27X BRD (SUTURE) ×1 IMPLANT
SYR BULB IRRIGATION 50ML (SYRINGE) ×2 IMPLANT
SYR CONTROL 10ML LL (SYRINGE) ×3 IMPLANT
TOWEL OR 17X24 6PK STRL BLUE (TOWEL DISPOSABLE) ×4 IMPLANT
TUBE CONNECTING 12X1/4 (SUCTIONS) ×2 IMPLANT
YANKAUER SUCT BULB TIP NO VENT (SUCTIONS) ×2 IMPLANT

## 2016-10-02 NOTE — Anesthesia Procedure Notes (Addendum)
Procedure Name: Intubation Date/Time: 10/02/2016 10:29 AM Performed by: Wanita Chamberlain Pre-anesthesia Checklist: Patient identified, Emergency Drugs available, Suction available, Patient being monitored and Timeout performed Patient Re-evaluated:Patient Re-evaluated prior to induction Oxygen Delivery Method: Circle system utilized Preoxygenation: Pre-oxygenation with 100% oxygen Induction Type: IV induction and Cricoid Pressure applied Ventilation: Mask ventilation without difficulty and Oral airway inserted - appropriate to patient size Laryngoscope Size: Mac and 4 Grade View: Grade II Tube type: Oral Tube size: 7.5 mm Number of attempts: 1 Airway Equipment and Method: Stylet and Oral airway Placement Confirmation: ETT inserted through vocal cords under direct vision,  positive ETCO2 and breath sounds checked- equal and bilateral Secured at: 23 cm Tube secured with: Tape Dental Injury: Teeth and Oropharynx as per pre-operative assessment  Difficulty Due To: Difficulty was anticipated Future Recommendations: Recommend- induction with short-acting agent, and alternative techniques readily available

## 2016-10-02 NOTE — Discharge Instructions (Signed)
Regional Anesthesia Blocks  1. Numbness may last from 3-48 hours after placement. The length of time depends on the medication injected and your individual response to the medication. If the numbness is not going away after 48 hours, call your surgeon. 2. Because you cannot feel it, you will need to take extra care to avoid injury. Because it may be weak, you may have difficulty moving it or using it. You may not know what position it is in without looking at it while the block is in effect.  3. For blocks in the legs and feet, returning to weight bearing and walking needs to be done carefully. You will need to wait until the numbness is entirely gone and the strength has returned. You should be able to move your leg and foot normally before you try and bear weight or walk. You will need someone to be with you when you first try to ensure you do not fall and possibly risk injury.  4. Bruising and tenderness at the needle site are common side effects and will resolve in a few days.  5. Persistent numbness or new problems with movement should be communicated to the surgeon or the [place] [phone] [place] [phone]).   Post Anesthesia Home Care Instructions  Activity: Get plenty of rest for the remainder of the day. A responsible individual must stay with you for 24 hours following the procedure.  For the next 24 hours, DO NOT: -Drive a car -Paediatric nurse -Drink alcoholic beverages -Take any medication unless instructed by your physician -Make any legal decisions or sign important papers.  Meals: Start with liquid foods such as gelatin or soup. Progress to regular foods as tolerated. Avoid greasy, spicy, heavy foods. If nausea and/or vomiting occur, drink only clear liquids until the nausea and/or vomiting subsides. Call your physician if vomiting continues.  Special Instructions/Symptoms: Your throat may feel dry or sore from the anesthesia or  the breathing tube placed in your throat during surgery. If this causes discomfort, gargle with warm salt water. The discomfort should disappear within 24 hours.  If you had a scopolamine patch placed behind your ear for the management of post- operative nausea and/or vomiting:  1. The medication in the patch is effective for 72 hours, after which it should be removed.  Wrap patch in a tissue and discard in the trash. Wash hands thoroughly with soap and water. 2. You may remove the patch earlier than 72 hours if you experience unpleasant side effects which may include dry mouth, dizziness or visual disturbances. 3. Avoid touching the patch. Wash your hands with soap and water after contact with the patch.   Information for Discharge Teaching: EXPAREL (bupivacaine liposome injectable suspension)   Your surgeon gave you EXPAREL(bupivacaine) in your surgical incision to help control your pain after surgery.   EXPAREL is a local anesthetic that provides pain relief by numbing the tissue around the surgical site.  EXPAREL is designed to release pain medication over time and can control pain for up to 72 hours.  Depending on how you respond to EXPAREL, you may require less pain medication during your recovery.  Possible side effects:  Temporary loss of sensation or ability to move in the area where bupivacaine was injected.  Nausea, vomiting, constipation  Rarely, numbness and tingling in your mouth or lips, lightheadedness, or anxiety may occur.  Call your doctor right away if you think you may be experiencing any of these sensations,  or if you have other questions regarding possible side effects.  Follow all other discharge instructions given to you by your surgeon or nurse. Eat a healthy diet and drink plenty of water or other fluids.  If you return to the hospital for any reason within 96 hours following the administration of EXPAREL, please inform your health care providers.

## 2016-10-02 NOTE — H&P (Signed)
FRANCES JOYNT is an 51 y.o. male.   Chief Complaint: inguinal hernia HPI: 51 yo male with previous umbilical hernia repair presented with left inguinal hernia repair that was newly symptomatic.   Past Medical History:  Diagnosis Date  . 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 COPD (chronic obstructive pulmonary disease) ([place])   . GERD (gastroesophageal reflux disease)   . History of [place] spotted fever age 68  . Left inguinal hernia   . Opioid abuse, in remission    PER PT IN REMISSION SINCE 2012 , BEEN ON METHADONE SINCE   . OSA (obstructive sleep apnea)    per study 06-08-2014 moderate osa / hypopnea syndrome---  PER PT "TITRATED FOR CPAP BUT WAS TOLD DID NOT NEED IT IF HE DIDN'T WANT TO"    Past Surgical History:  Procedure Laterality Date  . CARDIAC CATHETERIZATION  08-11-2006  dr cooper   Non-obstructive CAD-- minor plaque LAD and LCx/  Normal LVF , ef 65%  . TRANSTHORACIC ECHOCARDIOGRAM  08/07/2006   good overall LVF and LVSF/ inadequate study for evaluation LV wall motion  . UMBILICAL HERNIA REPAIR  09/2015    Family History  Problem Relation Age of Onset  . Cancer Mother        Lung  . Heart failure Maternal Grandmother   . Heart attack Father        death at age 61 from MI  . Heart attack Sister        death at age 8  . COPD Brother    Social History:  reports that he quit smoking about 2 years ago. His smoking use included Cigarettes. He has a 30.00 pack-year smoking history. He has never used smokeless tobacco. He reports that he does not drink alcohol or use drugs.  Allergies: No Known Allergies  Medications Prior to Admission  Medication Sig Dispense Refill  . esomeprazole (NEXIUM) 40 MG capsule Take 40 mg by mouth as needed.     . fluticasone furoate-vilanterol (BREO ELLIPTA) 100-25 MCG/INH AEPB Inhale 1 puff into the lungs every morning.    . methadone (DOLOPHINE) 10 MG/ML solution Take 110 mg by mouth every morning.      . Tiotropium Bromide Monohydrate (SPIRIVA RESPIMAT) 2.5 MCG/ACT AERS Inhale 2 puffs into the lungs every morning.      Results for orders placed or performed during the hospital encounter of 10/02/16 (from the past 48 hour(s))  Hemoglobin-hemacue, POC     Status: None   Collection Time: 10/02/16  8:58 AM  Result Value Ref Range   Hemoglobin 13.1 13.0 - 17.0 g/dL   No results found.  Review of Systems  Constitutional: Negative for chills and fever.  HENT: Negative for hearing loss.   Eyes: Negative for blurred vision and double vision.  Respiratory: Negative for cough and hemoptysis.   Cardiovascular: Negative for chest pain and palpitations.  Gastrointestinal: Negative for abdominal pain, nausea and vomiting.  Genitourinary: Negative for dysuria and urgency.  Musculoskeletal: Negative for myalgias and neck pain.  Skin: Negative for itching and rash.  Neurological: Negative for dizziness, tingling and headaches.  Endo/Heme/Allergies: Does not bruise/bleed easily.  Psychiatric/Behavioral: Negative for depression and suicidal ideas.    Blood pressure 121/82, pulse 79, temperature 98.9 F (37.2 C), temperature source Oral, resp. rate 11, height 6' (1.829 m), weight 99.3 kg (219 lb), SpO2 94 %. Physical Exam  Vitals reviewed. Constitutional: He is oriented to person, place,  and time. He appears well-developed and well-nourished.  HENT:  Head: Normocephalic and atraumatic.  Eyes: Pupils are equal, round, and reactive to light. Conjunctivae and EOM are normal.  Neck: Normal range of motion. Neck supple.  Cardiovascular: Normal rate and regular rhythm.   Respiratory: Effort normal and breath sounds normal.  GI: Soft. Bowel sounds are normal. He exhibits no distension. There is no tenderness.  Left inguinal hernia  Musculoskeletal: Normal range of motion.  Neurological: He is alert and oriented to person, place, and time.  Skin: Skin is warm and dry.  Psychiatric: He has a normal  mood and affect. His behavior is normal.     Assessment/Plan 51 yo male with symptomatic left inguinal hernia -open left inguinal hernia repair with mesh  Mickeal Skinner, MD 10/02/2016, 9:57 AM

## 2016-10-02 NOTE — Op Note (Signed)
Preop diagnosis: left inguinal hernia  Postop diagnosis: left direct inguinal hernia, lipoma of the cord  Procedure: open Left inguinal hernia repair with mesh  Surgeon: Gurney Maxin, M.D.  Asst: none  Anesthesia: Gen.   Indications for procedure: Clarence Murray is a 51 y.o. male with symptoms of pain and enlarging Left inguinal hernia(s). After discussing risks, alternatives and benefits he decided on open repair and was brought to day surgery for repair.  Description of procedure: The patient was brought into the operative suite, placed supine. Anesthesia was administered with endotracheal tube. Patient was strapped in place. The patient was prepped and draped in the usual sterile fashion.  The anterior superior iliac spine and pubic tubercle were identified on the Left side. An incision was made 1cm above the connecting line, representative of the location of the inguinal ligament. The subcutaneous tissue was bluntly dissected, scarpa's fascia was dissected away. The external abdominal oblique fascia was identified and sharply opened down to the external inguinal ring. The conjoint tendon and inguinal ligament were identified. The cord structures and sac were dissected free of the surrounding tissue in 360 degrees. A penrose drain was used to encircle the contents. The cremasteric fibers were dissected free of the contents of the cord and hernia sac. The cord structures (vessels and vas deferens) were identified and carefully dissected away from a fatty tissue. This was inspected and found to be a lipoma and no significant sac seen. The lipoma was dissected down to the internal inguinal ring. Preperitoneal fat was identified showing appropriate dissection. The lipoma was ligated with a 2-0 vicryl. The stump was then reduced into the preperitoneal space. On inspection of the floor there was a moderate sized direct hernia. The floor was reinforced with a 2-0 vicryl in running fashion to appose  the inguinal ligament to the conjoint tendon. A 3x6 Bard Soft mesh was then used to close the defect and further reinforce the floor. The mesh was sutured to the lacunar ligament and inguinal ligament using a 2-0 prolene in running fashion. Next the superior edge of the mesh was sutured to the conjoined tendon using a 2-0 running Prolene. An additional 2-0 Prolene was used to suture the tail ends of the mesh together re-creating the deep ring. Cord structures are running in a neutral position through the mesh. Next the external abdominal oblique fascia was closed with a 2-0 Vicryl in running fashion to re-create the external inguinal ring. Scarpa's fascia was closed with 3-0 Vicryl in running fashion. Skin was closed with a 4-0 Monocryl subcuticular stitch in running fashion. Dermabond place for dressing. Patient woke from anesthesia and brought to PACU in stable condition. All counts are correct.    Findings: left direct inguinal hernia, lipoma of the cord  Specimen: none  Blood loss: <30 ml  Local anesthesia: 50 ml Exparel:0.5% Marcaine mix  Complications: none  Implant: 3x6 bard soft mesh  Gurney Maxin, M.D. General, Bariatric, & Minimally Invasive Surgery [facility], [place] 11:33 AM 10/02/2016

## 2016-10-02 NOTE — Anesthesia Procedure Notes (Addendum)
Anesthesia Regional Block: TAP block   Pre-Anesthetic Checklist: ,, timeout performed, Correct Patient, Correct Site, Correct Laterality, Correct Procedure,, site marked, risks and benefits discussed, Surgical consent,  Pre-op evaluation,  At surgeon's request and post-op pain management  Laterality: Left  Prep: chloraprep       Needles:  Injection technique: Single-shot  Needle Type: Echogenic Stimulator Needle     Needle Length: 15cm  Needle Gauge: 20     Additional Needles:   Procedures: ultrasound guided,,,,,,,,  Narrative:  Start time: 10/02/2016 9:30 AM End time: 10/02/2016 9:40 AM Injection made incrementally with aspirations every 5 mL.  Performed by: Personally  Anesthesiologist: Adele Barthel P  Additional Notes: Functioning IV was confirmed and monitors were applied.  A 173mm 20ga BBraun echogenic stimulator needle was used. Sterile prep,hand hygiene and sterile gloves were used.  Negative aspiration and negative test dose prior to incremental administration of local anesthetic. The patient tolerated the procedure well.

## 2016-10-02 NOTE — Anesthesia Preprocedure Evaluation (Addendum)
Anesthesia Evaluation  Patient identified by MRN, date of birth, ID band Patient awake    Reviewed: Allergy & Precautions, NPO status , Patient's Chart, lab work & pertinent test results  Airway Mallampati: II  TM Distance: <3 FB Neck ROM: Full    Dental  (+) Poor Dentition, Missing,    Pulmonary sleep apnea , COPD (controlled),  COPD inhaler, former smoker,  + cough   Pulmonary exam normal breath sounds clear to auscultation       Cardiovascular negative cardio ROS Normal cardiovascular exam Rhythm:Regular Rate:Normal     Neuro/Psych Chronic neck Pain negative neurological ROS  negative psych ROS   GI/Hepatic GERD  Medicated and Controlled,(+)     substance abuse  ,   Endo/Other  negative endocrine ROS  Renal/GU negative Renal ROS     Musculoskeletal negative musculoskeletal ROS (+)   Abdominal   Peds  Hematology negative hematology ROS (+)   Anesthesia Other Findings   Reproductive/Obstetrics                         Anesthesia Physical Anesthesia Plan  ASA: II  Anesthesia Plan: General and Regional   Post-op Pain Management: GA combined w/ Regional for post-op pain   Induction: Intravenous  PONV Risk Score and Plan: 2 and Ondansetron, Dexamethasone, Propofol infusion and Midazolam  Airway Management Planned: Oral ETT  Additional Equipment:   Intra-op Plan:   Post-operative Plan: Extubation in OR  Informed Consent: I have reviewed the patients History and Physical, chart, labs and discussed the procedure including the risks, benefits and alternatives for the proposed anesthesia with the patient or authorized representative who has indicated his/her understanding and acceptance.   Dental advisory given  Plan Discussed with: CRNA  Anesthesia Plan Comments:       Anesthesia Quick Evaluation

## 2016-10-02 NOTE — Transfer of Care (Signed)
Immediate Anesthesia Transfer of Care Note  Patient: Clarence Murray  Procedure(s) Performed: Procedure(s) (LRB): LEFT OPEN INGUINAL HERNIA REPAIR WITH MESH (Left) INSERTION OF MESH (Left)  Patient Location: PACU  Anesthesia Type: General  Level of Consciousness: awake, sedated, patient cooperative and responds to stimulation  Airway & Oxygen Therapy: Patient Spontanous Breathing and Patient connected to [place] oxygen  Post-op Assessment: Report given to PACU RN, Post -op Vital signs reviewed and stable and Patient moving all extremities  Post vital signs: Reviewed and stable  Complications: No apparent anesthesia complications

## 2016-10-03 ENCOUNTER — Encounter (HOSPITAL_BASED_OUTPATIENT_CLINIC_OR_DEPARTMENT_OTHER): Payer: Self-pay | Admitting: General Surgery

## 2016-10-03 NOTE — Anesthesia Postprocedure Evaluation (Signed)
Anesthesia Post Note  Patient: Clarence Murray  Procedure(s) Performed: Procedure(s) (LRB): LEFT OPEN INGUINAL HERNIA REPAIR WITH MESH (Left) INSERTION OF MESH (Left)     Patient location during evaluation: PACU Anesthesia Type: Regional and General Level of consciousness: awake and alert Pain management: pain level controlled Vital Signs Assessment: post-procedure vital signs reviewed and stable Respiratory status: spontaneous breathing, nonlabored ventilation, respiratory function stable and patient connected to nasal cannula oxygen Cardiovascular status: blood pressure returned to baseline and stable Postop Assessment: no signs of nausea or vomiting Anesthetic complications: no    Last Vitals:  Vitals:   10/02/16 1330 10/02/16 1424  BP: 127/86 (!) 145/73  Pulse: 94 100  Resp: (!) 7 18  Temp:  37.3 C    Last Pain:  Vitals:   10/02/16 1424  TempSrc: Oral                 Ryan P Ellender

## 2016-10-06 NOTE — Addendum Note (Signed)
Addendum  created 10/06/16 1921 by Murvin Natal, MD   Anesthesia Intra Blocks edited, Sign clinical note

## 2017-06-11 ENCOUNTER — Encounter: Payer: Self-pay | Admitting: Internal Medicine

## 2017-06-13 ENCOUNTER — Other Ambulatory Visit (HOSPITAL_COMMUNITY): Payer: Self-pay | Admitting: Internal Medicine

## 2017-06-13 DIAGNOSIS — K458 Other specified abdominal hernia without obstruction or gangrene: Secondary | ICD-10-CM

## 2017-06-18 ENCOUNTER — Encounter (HOSPITAL_COMMUNITY): Payer: Self-pay

## 2017-06-18 ENCOUNTER — Ambulatory Visit (HOSPITAL_COMMUNITY)
Admission: RE | Admit: 2017-06-18 | Discharge: 2017-06-18 | Disposition: A | Discharge status: Home / Self Care | Payer: 59 | Source: Ambulatory Visit | Attending: Internal Medicine | Admitting: Internal Medicine

## 2017-06-18 DIAGNOSIS — K458 Other specified abdominal hernia without obstruction or gangrene: Secondary | ICD-10-CM | POA: Diagnosis not present

## 2017-06-18 DIAGNOSIS — J479 Bronchiectasis, uncomplicated: Secondary | ICD-10-CM | POA: Insufficient documentation

## 2017-06-18 DIAGNOSIS — K8020 Calculus of gallbladder without cholecystitis without obstruction: Secondary | ICD-10-CM | POA: Insufficient documentation

## 2017-06-18 DIAGNOSIS — I700 Atherosclerosis of aorta: Secondary | ICD-10-CM | POA: Diagnosis not present

## 2017-06-18 MED ORDER — IOHEXOL 300 MG/ML  SOLN
100.0000 mL | Freq: Once | INTRAMUSCULAR | Status: AC | PRN
  Administered 2017-06-18: 100 mL via INTRAVENOUS

## 2017-06-23 ENCOUNTER — Other Ambulatory Visit (HOSPITAL_COMMUNITY): Payer: Self-pay | Admitting: Internal Medicine

## 2017-06-23 DIAGNOSIS — R1011 Right upper quadrant pain: Secondary | ICD-10-CM

## 2017-06-23 DIAGNOSIS — O0289 Other abnormal products of conception: Secondary | ICD-10-CM

## 2017-07-01 ENCOUNTER — Encounter (HOSPITAL_COMMUNITY): Payer: Self-pay

## 2017-07-01 ENCOUNTER — Ambulatory Visit (HOSPITAL_COMMUNITY)
Admission: RE | Admit: 2017-07-01 | Discharge: 2017-07-01 | Disposition: A | Discharge status: Home / Self Care | Payer: 59 | Source: Ambulatory Visit | Attending: Internal Medicine | Admitting: Internal Medicine

## 2017-07-01 ENCOUNTER — Encounter (HOSPITAL_COMMUNITY)
Admission: RE | Admit: 2017-07-01 | Discharge: 2017-07-01 | Disposition: A | Discharge status: Home / Self Care | Payer: 59 | Source: Ambulatory Visit | Attending: Internal Medicine | Admitting: Internal Medicine

## 2017-07-01 DIAGNOSIS — O0289 Other abnormal products of conception: Secondary | ICD-10-CM

## 2017-07-01 DIAGNOSIS — K9186 Retained cholelithiasis following cholecystectomy: Secondary | ICD-10-CM | POA: Diagnosis present

## 2017-07-01 DIAGNOSIS — K8020 Calculus of gallbladder without cholecystitis without obstruction: Secondary | ICD-10-CM | POA: Insufficient documentation

## 2017-07-01 DIAGNOSIS — K769 Liver disease, unspecified: Secondary | ICD-10-CM | POA: Diagnosis not present

## 2017-07-01 DIAGNOSIS — R1011 Right upper quadrant pain: Secondary | ICD-10-CM | POA: Diagnosis present

## 2017-07-01 MED ORDER — TECHNETIUM TC 99M MEBROFENIN IV KIT
5.2000 | PACK | Freq: Once | INTRAVENOUS | Status: AC | PRN
  Administered 2017-07-01: 5.2 via INTRAVENOUS

## 2017-07-09 ENCOUNTER — Other Ambulatory Visit: Payer: Self-pay | Admitting: Physician Assistant

## 2017-07-09 DIAGNOSIS — H6982 Other specified disorders of Eustachian tube, left ear: Secondary | ICD-10-CM

## 2017-07-09 DIAGNOSIS — H9212 Otorrhea, left ear: Secondary | ICD-10-CM

## 2017-07-11 ENCOUNTER — Ambulatory Visit (HOSPITAL_COMMUNITY)
Admission: EM | Admit: 2017-07-11 | Discharge: 2017-07-11 | Disposition: A | Discharge status: Home / Self Care | Payer: 59 | Attending: Family Medicine | Admitting: Family Medicine

## 2017-07-11 ENCOUNTER — Ambulatory Visit (INDEPENDENT_AMBULATORY_CARE_PROVIDER_SITE_OTHER): Payer: 59

## 2017-07-11 ENCOUNTER — Encounter (HOSPITAL_COMMUNITY): Payer: Self-pay | Admitting: Emergency Medicine

## 2017-07-11 DIAGNOSIS — S46911A Strain of unspecified muscle, fascia and tendon at shoulder and upper arm level, right arm, initial encounter: Secondary | ICD-10-CM | POA: Diagnosis not present

## 2017-07-11 DIAGNOSIS — M25511 Pain in right shoulder: Secondary | ICD-10-CM | POA: Diagnosis not present

## 2017-07-11 MED ORDER — CYCLOBENZAPRINE HCL 10 MG PO TABS: 10.0000 mg | ORAL_TABLET | Freq: Every evening | ORAL | 0 refills | Status: DC | PRN

## 2017-07-11 NOTE — Discharge Instructions (Addendum)
Sling given Continue conservative management of rest, ice, and gentle stretches. Including wall climbs and pendulum swings Continue ibuprofen 800 mg at home (may cause abdominal discomfort, ulcers, and GI bleeds avoid taking with other NSAIDs) Take cyclobenzaprine at nighttime for symptomatic relief. Avoid driving or operating heavy machinery while using medication. Referral to orthopedist for further evaluation and management if symptoms do not improve Return or go to ER if new or worsening symptoms (fever, chills, chest pain, abdominal pain, changes in bowel or bladder habits, pain radiating into lower legs, etc...)

## 2017-07-11 NOTE — ED Provider Notes (Signed)
[place]   [id number] 07/11/17 Arrival Time: 58  SUBJECTIVE: History from: patient. Clarence Murray is a 52 y.o. male complains of right shoulder pain that began today.  Began while reaching with his right arm he felt an audible pop.  Localizes the pain to the outside of right shoulder.  Describes the pain as intermittent and sharp in character.  Has not tried OTC medications without relief.  Symptoms are made worse with ROM.  Denies similar symptoms in the past.  Denies fever, chills, erythema, ecchymosis, effusion, weakness, numbness and tingling.      ROS: As per HPI.  Past Medical History:  Diagnosis Date  . 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 COPD (chronic obstructive pulmonary disease) ([place])   . GERD (gastroesophageal reflux disease)   . History of Rocky Mountain spotted fever age 33  . Left inguinal hernia   . Opioid abuse, in remission ([place])    PER PT IN REMISSION SINCE 2012 , BEEN ON METHADONE SINCE   . OSA (obstructive sleep apnea)    per study 06-08-2014 moderate osa / hypopnea syndrome---  PER PT "TITRATED FOR CPAP BUT WAS TOLD DID NOT NEED IT IF HE DIDN'T WANT TO"   Past Surgical History:  Procedure Laterality Date  . CARDIAC CATHETERIZATION  08-11-2006  dr cooper   Non-obstructive CAD-- minor plaque LAD and LCx/  Normal LVF , ef 65%  . INGUINAL HERNIA REPAIR Left 10/02/2016   Procedure: LEFT OPEN INGUINAL HERNIA REPAIR WITH MESH;  Surgeon: Kinsinger, Arta Bruce, MD;  Location: [facility];  Service: General;  Laterality: Left;  . INSERTION OF MESH Left 10/02/2016   Procedure: INSERTION OF MESH;  Surgeon: Kinsinger, Arta Bruce, MD;  Location: [facility];  Service: General;  Laterality: Left;  . TRANSTHORACIC ECHOCARDIOGRAM  08/07/2006   good overall LVF and LVSF/ inadequate study for evaluation LV wall motion  . UMBILICAL HERNIA REPAIR  09/2015   No Known Allergies  Outpatient Medications   fluticasone furoate-vilanterol (BREO ELLIPTA) 100-25 MCG/INH AEPB    methadone (DOLOPHINE) 10 MG/ML solution    esomeprazole (NEXIUM) 40 MG capsule    ibuprofen (ADVIL,MOTRIN) 800 MG tablet    Oxycodone HCl 10 MG TABS    Tiotropium Bromide Monohydrate (SPIRIVA RESPIMAT) 2.5 MCG/ACT AERS     Social History   Socioeconomic History  . Marital status: Married    Spouse name: Not on file  . Number of children: Not on file  . Years of education: Not on file  . Highest education level: Not on file  Occupational History    Employer: [employer]  . Financial resource strain: Not on file  . Food insecurity:    Worry: Not on file    Inability: Not on file  . Transportation needs:    Medical: Not on file    Non-medical: Not on file  Tobacco Use  . Smoking status: Former Smoker    Packs/day: 2.00    Years: 15.00    Pack years: 30.00    Types: Cigarettes    Last attempt to quit: 09/27/2014    Years since quitting: 2.7  . Smokeless tobacco: Never Used  Substance and Sexual Activity  . Alcohol use: No    Alcohol/week: 0.0 oz  . Drug use: No    Comment: IN REMISSION OPIOID ABUSE SINCE 2012 PER PT   . Sexual activity: Not on file  Lifestyle  .  Physical activity:    Days per week: Not on file    Minutes per session: Not on file  . Stress: Not on file  Relationships  . Social connections:    Talks on phone: Not on file    Gets together: Not on file    Attends religious service: Not on file    Active member of club or organization: Not on file    Attends meetings of clubs or organizations: Not on file    Relationship status: Not on file  . Intimate partner violence:    Fear of current or ex partner: Not on file    Emotionally abused: Not on file    Physically abused: Not on file    Forced sexual activity: Not on file  Other Topics Concern  . Not on file  Social History Narrative   Works for a tobacco company, 3rd shift   Family History  Problem Relation  Age of Onset  . Cancer Mother        Lung  . Heart failure Maternal Grandmother   . Heart attack Father        death at age 69 from MI  . Heart attack Sister        death at age 40  . COPD Brother     OBJECTIVE:  Vitals:   07/11/17 1656 07/11/17 1658  BP: 140/82   Pulse: 99   Resp: 16   Temp: 98.4 F (36.9 C)   TempSrc: Oral   SpO2: 92%   Weight:  205 lb (93 kg)    General appearance: AOx3; in no acute distress.  Head: NCAT Lungs: CTA bilaterally Heart: RRR.  Clear S1 and S2 without murmur, gallops, or rubs.  Radial pulses 2+ bilaterally. Musculoskeletal:  Right shoulder: Inspection: Skin warm, dry, clear and intact without obvious erythema,  effusion, or ecchymosis.  Palpation: Tender to palpation about the lateral aspect of the deltoid ROM: 0-90 active and passive ROM Strength: 5/5 elbow flexion, 5/5 elbow extension, 5/5 grip strength Sensation intact Left shoulder:   Inspection: Skin clear and intact without obvious erythema, effusion, or  ecchymosis.  Skin warm and dry to the touch  Palpation: Nontender to palpation  ROM: FROM active and passive  Strength:  5/5 elbow flexion, 5/5 elbow, extension, 5/5 grip strength  Sensation intact Skin: warm and dry Neurologic: Ambulates without difficulty; Sensation intact about the upper/ lower extremities Psychological: alert and cooperative; normal mood and affect   DIAGNOSTIC STUDIES:   CLINICAL DATA: Right shoulder pain after feeling a popping sensation while working today.  EXAM: RIGHT SHOULDER - 2+ VIEW  COMPARISON: None.  FINDINGS: Mild inferior acromioclavicular spur formation. No fracture or dislocation.  IMPRESSION: No fracture or dislocation. Mild AC joint degenerative changes.   Electronically Signed By: Claudie Revering M.D. On: 07/11/2017 17:25  X-rays negative for bony abnormalities including fracture, or dislocation.  No soft tissue swelling.    I have reviewed the x-rays myself and the  radiologist interpretation. I am in agreement with the radiologist interpretation.    ASSESSMENT & PLAN:  1. Pain in joint of right shoulder   2. Muscle strain, shoulder region, right, initial encounter      Meds ordered this encounter  Medications  . cyclobenzaprine (FLEXERIL) 10 MG tablet    Sig: Take 1 tablet (10 mg total) by mouth at bedtime as needed for muscle spasms.    Dispense:  10 tablet    Refill:  0    Order  Specific Question:   Supervising Provider    Answer:   Wynona Luna [[id number]]    Sling given Continue conservative management of rest, ice, and gentle stretches. Including wall climbs and pendulum swings Continue ibuprofen 800 mg at home (may cause abdominal discomfort, ulcers, and GI bleeds avoid taking with other NSAIDs) Take cyclobenzaprine at nighttime for symptomatic relief. Avoid driving or operating heavy machinery while using medication. Referral to orthopedist for further evaluation and management if symptoms do not improve Return or go to ER if new or worsening symptoms (fever, chills, chest pain, abdominal pain, changes in bowel or bladder habits, pain radiating into lower legs, etc...)   Reviewed expectations re: course of current medical issues. Questions answered. Outlined signs and symptoms indicating need for more acute intervention. Patient verbalized understanding. After Visit Summary given.    Lestine Box, PA-C 07/11/17 1755

## 2017-07-11 NOTE — ED Triage Notes (Signed)
PT was working and felt a pop in right shoulder. Palpable deformity to right upper arm. Has never dislocated before.

## 2017-08-13 ENCOUNTER — Telehealth: Payer: Self-pay

## 2017-08-13 ENCOUNTER — Other Ambulatory Visit: Payer: 59

## 2017-08-13 NOTE — Telephone Encounter (Signed)
Pt had a previsit appointment at 8:00 this morning, and was a no show. Left message to call back to reschedule by today at 5 pm, to keep his procedure appointment.

## 2017-08-13 NOTE — Telephone Encounter (Signed)
Called pt back. Spoke with pt and reschedule previsit for Monday 17, 1030 room 50. MPJ/RN

## 2017-08-18 ENCOUNTER — Telehealth: Payer: Self-pay | Admitting: *Deleted

## 2017-08-18 NOTE — Telephone Encounter (Signed)
Patient no show pv today. Called pt, no answer left message for him to call us back today before 5 pm to keep colon as scheduled.

## 2017-08-20 ENCOUNTER — Encounter: Payer: Self-pay | Admitting: Internal Medicine

## 2017-08-22 ENCOUNTER — Ambulatory Visit
Admission: RE | Admit: 2017-08-22 | Discharge: 2017-08-22 | Disposition: A | Discharge status: Home / Self Care | Payer: 59 | Source: Ambulatory Visit | Attending: Physician Assistant | Admitting: Physician Assistant

## 2017-08-22 DIAGNOSIS — H9212 Otorrhea, left ear: Secondary | ICD-10-CM

## 2017-08-22 DIAGNOSIS — H6982 Other specified disorders of Eustachian tube, left ear: Secondary | ICD-10-CM

## 2017-08-27 ENCOUNTER — Encounter: Payer: 59 | Admitting: Internal Medicine

## 2019-04-12 ENCOUNTER — Ambulatory Visit: Payer: 59 | Attending: Internal Medicine

## 2019-04-12 DIAGNOSIS — Z20822 Contact with and (suspected) exposure to covid-19: Secondary | ICD-10-CM

## 2019-04-13 LAB — NOVEL CORONAVIRUS, NAA: SARS-CoV-2, NAA: NOT DETECTED

## 2019-07-30 ENCOUNTER — Ambulatory Visit (INDEPENDENT_AMBULATORY_CARE_PROVIDER_SITE_OTHER): Payer: 59

## 2019-07-30 ENCOUNTER — Encounter: Payer: Self-pay | Admitting: Emergency Medicine

## 2019-07-30 ENCOUNTER — Telehealth: Payer: Self-pay | Admitting: Emergency Medicine

## 2019-07-30 ENCOUNTER — Other Ambulatory Visit: Payer: Self-pay

## 2019-07-30 ENCOUNTER — Ambulatory Visit: Payer: 59 | Admitting: Emergency Medicine

## 2019-07-30 VITALS — BP 142/74 | HR 76 | Temp 97.9°F | Ht 72.0 in | Wt 199.2 lb

## 2019-07-30 DIAGNOSIS — J449 Chronic obstructive pulmonary disease, unspecified: Secondary | ICD-10-CM

## 2019-07-30 DIAGNOSIS — R0602 Shortness of breath: Secondary | ICD-10-CM | POA: Diagnosis not present

## 2019-07-30 DIAGNOSIS — J9611 Chronic respiratory failure with hypoxia: Secondary | ICD-10-CM | POA: Diagnosis not present

## 2019-07-30 DIAGNOSIS — G4733 Obstructive sleep apnea (adult) (pediatric): Secondary | ICD-10-CM | POA: Diagnosis not present

## 2019-07-30 NOTE — Progress Notes (Signed)
Subjective:    Patient ID: Clarence Murray, male    DOB: October 11, 1965, 54 y.o.   MRN: [id number]  HPI 54 year old former smoker (30 pack years) with a history of GERD, opioid abuse in remission and on methadone, OSA 2016 not on CPAP. Referred today for evaluation of COPD.  Had PNA in early April. Persistent dyspnea following. He has been admitted for resp failure in 2016 - required BiPAP.  Was treated with levaquin and pred again 06/22/19. He is still having exertional SOB. Hears wheeze and has cough in the am - yellow thick. Several exacerbations in the last year. His exercise tolerance has declined over the last years. He was found to be hypoxemic at his last PCP OV >> was given O2 to use w exertion. Does not snore - has had witnessed apneas.   Was on Spiriva Respimat, Breo >> changed to [place]. Feels that he is benefiting from this.   daliresp > has been on this for years   Review of Systems  Past Medical History:  Diagnosis Date  . 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 COPD (chronic obstructive pulmonary disease) ([place])   . GERD (gastroesophageal reflux disease)   . History of Rocky Mountain spotted fever age 21  . Left inguinal hernia   . Opioid abuse, in remission ([place])    PER PT IN REMISSION SINCE 2012 , BEEN ON METHADONE SINCE   . OSA (obstructive sleep apnea)    per study 06-08-2014 moderate osa / hypopnea syndrome---  PER PT "TITRATED FOR CPAP BUT WAS TOLD DID NOT NEED IT IF HE DIDN'T WANT TO"     Family History  Problem Relation Age of Onset  . Cancer Mother        Lung  . Heart failure Maternal Grandmother   . Heart attack Father        death at age 87 from MI  . Heart attack Sister        death at age 6  . COPD Brother      Social History   Socioeconomic History  . Marital status: Married    Spouse name: Not on file  . Number of children: Not on file  . Years of education: Not on file  . Highest education level: Not on file   Occupational History    Employer: [employer]  Tobacco Use  . Smoking status: Former Smoker    Packs/day: 2.00    Years: 15.00    Pack years: 30.00    Types: Cigarettes    Quit date: 09/27/2014    Years since quitting: 4.8  . Smokeless tobacco: Never Used  Substance and Sexual Activity  . Alcohol use: No    Alcohol/week: 0.0 standard drinks  . Drug use: No    Comment: IN REMISSION OPIOID ABUSE SINCE 2012 PER PT   . Sexual activity: Not on file  Other Topics Concern  . Not on file  Social History Narrative   Works for a tobacco company, 3rd shift   Social Determinants of Radio broadcast assistant Strain:   . Difficulty of Paying Living Expenses:   Food Insecurity:   . Worried About Charity fundraiser in the Last Year:   . Arboriculturist in the Last Year:   Transportation Needs:   . Film/video editor (Medical):   Marland Kitchen Lack of Transportation (Non-Medical):   Physical Activity:   . Days of  Exercise per Week:   . Minutes of Exercise per Session:   Stress:   . Feeling of Stress :   Social Connections:   . Frequency of Communication with Friends and Family:   . Frequency of Social Gatherings with Friends and Family:   . Attends Religious Services:   . Active Member of Clubs or Organizations:   . Attends Archivist Meetings:   Marland Kitchen Marital Status:   Intimate Partner Violence:   . Fear of Current or Ex-Partner:   . Emotionally Abused:   Marland Kitchen Physically Abused:   . Sexually Abused:     Has worked as a Dealer, and for tobacco company. Some brake exposure No military From [place].   No Known Allergies   Outpatient Medications Prior to Visit  Medication Sig Dispense Refill  . BREZTRI AEROSPHERE 160-9-4.8 MCG/ACT AERO     . esomeprazole (NEXIUM) 40 MG capsule Take 40 mg by mouth as needed.     Marland Kitchen ibuprofen (ADVIL,MOTRIN) 800 MG tablet Take 1 tablet (800 mg total) by mouth every 8 (eight) hours as needed. 30 tablet 0  . methadone (DOLOPHINE) 10 MG/ML  solution Take 110 mg by mouth every morning.     Marland Kitchen albuterol (PROAIR HFA) 108 (90 Base) MCG/ACT inhaler Inhale into the lungs.    . cyclobenzaprine (FLEXERIL) 10 MG tablet Take 1 tablet (10 mg total) by mouth at bedtime as needed for muscle spasms. (Patient not taking: Reported on 07/30/2019) 10 tablet 0  . fluticasone furoate-vilanterol (BREO ELLIPTA) 100-25 MCG/INH AEPB Inhale 1 puff into the lungs every morning.    . Oxycodone HCl 10 MG TABS Take 1 tablet (10 mg total) by mouth every 4 (four) hours as needed (for pain). (Patient not taking: Reported on 07/30/2019) 30 tablet 0  . Tiotropium Bromide Monohydrate (SPIRIVA RESPIMAT) 2.5 MCG/ACT AERS Inhale 2 puffs into the lungs every morning.     No facility-administered medications prior to visit.        Objective:   Physical Exam Vitals:   07/30/19 1029  BP: (!) 142/74  Pulse: 76  Temp: 97.9 F (36.6 C)  TempSrc: Oral  SpO2: 92%  Weight: 199 lb 3.2 oz (90.4 kg)  Height: 6' (1.829 m)    Gen: Pleasant, chronically ill appearing, in no distress,  normal affect  ENT: No lesions,  mouth clear,  oropharynx clear, no postnasal drip  Neck: No JVD, no stridor  Lungs: No use of accessory muscles, coarse B BS, some exp wheeze on a forced exp  Cardiovascular: RRR, heart sounds normal, no murmur or gallops, no peripheral edema  Musculoskeletal: No deformities, no cyanosis or clubbing, lipoma on L mid back  Neuro: alert, awake, non focal  Skin: Warm, no lesions or rash      Assessment & Plan:  COPD (chronic obstructive pulmonary disease) (HCC) Presumed severe COPD with frequent exacerbations.  He did just quit smoking after pneumonia/exacerbation in April.  He still has not returned to his previous baseline, was treated with another round of prednisone and Levaquin at that time.  Also changed to [employer].  He feels that he is benefiting from this, plan to continue.  He is also on Daliresp, plan to continue for now.  CXR today Full  pulmonary function testing Please continue respiratory, 2 puffs twice a day.  Remember to rinse and gargle after using. Keep albuterol available to use 2 puffs when you need it for shortness of breath, chest tightness, wheezing. Continue Daliresp as you have  been taking it. We will check lab work (alpha-1 antitrypsin level and genotype) at a future visit Walking oximetry today to see if your oxygen level drops Based on your persistent symptoms following your recent pneumonia I do not believe you can go back to work until July 1.  We will write a letter to this effect. Follow with Dr. Lamonte Sakai next available with full pulmonary function testing on the same day.  OSA (obstructive sleep apnea) Moderate OSA noted in 2016 but he was never treated.  He is willing to try CPAP and I will order an auto titration device and best fit mask.  If he needs a repeat sleep study in order to qualify then we will arrange for this.  Chronic respiratory failure with hypoxia (HCC) Walking oximetry today to see if he still requires.  He was started on O2 when he was flaring and April.  Has it available at home but has not been using  Baltazar Apo, MD, PhD 07/30/2019, 3:13 PM [facility] [phone] or if no answer [phone]

## 2019-07-30 NOTE — Telephone Encounter (Signed)
Spoke with patient. He stated that he would like for the letter RB discussed with him to be faxed to [phone], Attn: Dawn. He would also like to have a copy mailed to him. I verified his address.   Reviewed his AVS from his visit and RB suggested that he remain out of work until July 1st. Will go ahead and compose letter and have RB sign while he is in office today.

## 2019-07-30 NOTE — Assessment & Plan Note (Signed)
Walking oximetry today to see if he still requires.  He was started on O2 when he was flaring and April.  Has it available at home but has not been using

## 2019-07-30 NOTE — Assessment & Plan Note (Signed)
Moderate OSA noted in 2016 but he was never treated.  He is willing to try CPAP and I will order an auto titration device and best fit mask.  If he needs a repeat sleep study in order to qualify then we will arrange for this.

## 2019-07-30 NOTE — Assessment & Plan Note (Signed)
Presumed severe COPD with frequent exacerbations.  He did just quit smoking after pneumonia/exacerbation in April.  He still has not returned to his previous baseline, was treated with another round of prednisone and Levaquin at that time.  Also changed to [employer].  He feels that he is benefiting from this, plan to continue.  He is also on Daliresp, plan to continue for now.  CXR today Full pulmonary function testing Please continue respiratory, 2 puffs twice a day.  Remember to rinse and gargle after using. Keep albuterol available to use 2 puffs when you need it for shortness of breath, chest tightness, wheezing. Continue Daliresp as you have been taking it. We will check lab work (alpha-1 antitrypsin level and genotype) at a future visit Walking oximetry today to see if your oxygen level drops Based on your persistent symptoms following your recent pneumonia I do not believe you can go back to work until July 1.  We will write a letter to this effect. Follow with Dr. Lamonte Sakai next available with full pulmonary function testing on the same day.

## 2019-07-30 NOTE — Patient Instructions (Addendum)
CXR today Full pulmonary function testing Please continue respiratory, 2 puffs twice a day.  Remember to rinse and gargle after using. Keep albuterol available to use 2 puffs when you need it for shortness of breath, chest tightness, wheezing. Continue Daliresp as you have been taking it. We will check lab work (alpha-1 antitrypsin level and genotype) at a future visit Walking oximetry today to see if your oxygen level drops We will order CPAP, auto titration settings with best fit mask.  Try to start wearing this every night while sleeping. Based on your persistent symptoms following your recent pneumonia I do not believe you can go back to work until July 1.  We will write a letter to this effect. Follow with Dr. Lamonte Sakai next available with full pulmonary function testing on the same day.

## 2019-08-03 ENCOUNTER — Telehealth: Payer: Self-pay | Admitting: Emergency Medicine

## 2019-08-03 NOTE — Telephone Encounter (Signed)
Based on his initial eval I believed he could go back July 1.  Please ask him if this is the case. If he does not believe he can go back to work July 1 then we can discuss short term disability - he would need to get paperwork for this. We can write a letter to cover his absence until the disability paperwork can be done.

## 2019-08-03 NOTE — Telephone Encounter (Signed)
Pt needs letter or will lose job. Pt's wife called back very upset about this. Please fax letter today if possible and call pt to inform letter has been sent.

## 2019-08-03 NOTE — Telephone Encounter (Signed)
Spoke with pt's wife Vaughan Basta and advised her that I faxed the letter to [phone]. She had a quation about the return date to work. She stated the two tests that RB wanted him to do are not until July. Does the note need to be extended until after those tests are done? RB please advise.

## 2019-08-04 ENCOUNTER — Encounter: Payer: Self-pay | Admitting: *Deleted

## 2019-08-04 NOTE — Telephone Encounter (Signed)
Called spoke with patient.  Gave them Dr. Agustina Caroli recommendations.  Letter created. Patient states he could get from [place]. Not needed to be faxed. Will contact office if needs signature .  Nothing further needed at this time.

## 2019-08-17 ENCOUNTER — Telehealth: Payer: Self-pay | Admitting: Emergency Medicine

## 2019-08-17 NOTE — Telephone Encounter (Addendum)
Spoke with pt's wife, Clarence Murray. They are wanting pt's work leave to be extended until the end of July. Pt's wife does not feel like he's ready to go back. He is supposed to be going back on 09/02/2019. Pt is scheduled for a PFT and OV on 09/15/2019.  Dr. Lamonte Sakai - please advise if you would be willing to extend his leave.

## 2019-08-18 NOTE — Telephone Encounter (Signed)
Attempted to call pt's wife but there was no answer. I have left a message for her to return our call x1.

## 2019-08-18 NOTE — Telephone Encounter (Signed)
Ok with me to write a letter to extend to 7/15 so we can have our OV

## 2019-08-19 NOTE — Telephone Encounter (Signed)
2nd attempt made to reach pt/wife by phone.  The line rang and then went to a busy signal, no option to leave a message.

## 2019-08-20 NOTE — Telephone Encounter (Signed)
Attempted to call pt's wife but there was no answer and no option to leave a message. Will try back.

## 2019-08-23 ENCOUNTER — Encounter: Payer: Self-pay | Admitting: *Deleted

## 2019-08-23 ENCOUNTER — Telehealth: Payer: Self-pay | Admitting: Emergency Medicine

## 2019-08-23 NOTE — Telephone Encounter (Signed)
Spoke with pt's wife, Clarence Murray. Made her aware that Dr. Lamonte Sakai was okay with extended his leave from work until 09/16/2019. Due to the pt working third shift, he would need the letter to be for 09/17/2019. Letter has been written and faxed to pt's employer at [phone]. Nothing further was needed.

## 2019-08-23 NOTE — Telephone Encounter (Signed)
Attempted to call pt's wife but there was no answer and no option to leave a message. We have attempted to contact pt several times with no success or call back from pt. Per triage protocol, message will be closed.

## 2019-09-11 ENCOUNTER — Other Ambulatory Visit (HOSPITAL_COMMUNITY)
Admission: RE | Admit: 2019-09-11 | Discharge: 2019-09-11 | Disposition: A | Discharge status: Home / Self Care | Payer: 59 | Source: Ambulatory Visit | Attending: Pulmonary Disease | Admitting: Pulmonary Disease

## 2019-09-11 DIAGNOSIS — Z01812 Encounter for preprocedural laboratory examination: Secondary | ICD-10-CM | POA: Diagnosis not present

## 2019-09-11 DIAGNOSIS — Z20822 Contact with and (suspected) exposure to covid-19: Secondary | ICD-10-CM | POA: Insufficient documentation

## 2019-09-11 LAB — SARS CORONAVIRUS 2 (TAT 6-24 HRS): SARS Coronavirus 2: NEGATIVE

## 2019-09-14 NOTE — Progress Notes (Signed)
@Patient  ID: Clarence Murray, male    DOB: 10-Nov-1965, 54 y.o.   MRN: [id number]  Chief Complaint  Patient presents with  . Follow-up    here to go over pft and edema in legs    Referring provider: Bonnita Nasuti, MD  HPI:  54 year old male former smoker followed in our office for shortness of breath, COPD, obstructive sleep apnea, chronic respiratory failure  PMH: Chronic pain, history of polysubstance abuse in remission on methadone Smoker/ Smoking History: Former smoker Maintenance:  Librarian, academic, Dali[employer] Pt of: Dr. Lamonte Sakai  09/15/2019  - Visit   54 year old male former smoker followed in our office for COPD, chronic respiratory failure, obstructive sleep apnea.  He is followed by Dr. Lamonte Sakai.  He was originally seen for his consult on 07/30/2019.  At that time he was tolerating Breztri and Daliresp.  He was asked to present back to our office for a pulmonary function test he is completed that today.  Those results are listed below:  09/15/2019-pulmonary function test-FVC 2.36 (47% predicted), postbronchodilator ratio 38, postbronchodilator FEV1 0.99 (25% predicted), positive bronchodilator response in FEV1 and mid flows, TLC 7.49 (107% predicted), DLCO 15.79 (54% predicted)  Patient reporting that he feels that he is holding onto more fluid today and he has had increased shortness of breath.  Patient's weight is up around 16 pounds.  He is unsure if he will be able to return back to work full-time.  We will discuss this today.  Patient walked in office today did not have oxygen desaturations while maintained on 2 L of O2.  Oxygen saturations starting were 97% on 2 L sitting in dropped to 91% on the third lap.  Patient has a previous history of obstructive sleep apnea.  We will discuss this he is not currently maintained on a CPAP right now.  He is interested in resuming.  Questionaires / Pulmonary Flowsheets:   ACT:  No flowsheet data found.  MMRC: mMRC Dyspnea Scale mMRC Score   09/15/2019 3    Epworth:  No flowsheet data found.  Tests:   07/30/2019-chest x-ray-normal chest radiographs  09/15/2019-pulmonary function test-FVC 2.36 (47% predicted), postbronchodilator ratio 38, postbronchodilator FEV1 0.99 (25% predicted), positive bronchodilator response in FEV1 and mid flows, TLC 7.49 (107% predicted), DLCO 15.79 (54% predicted)  FENO:  No results found for: NITRICOXIDE  PFT: PFT Results Latest Ref Rng & Units 09/15/2019  FVC-Pre L 2.36  FVC-Predicted Pre % 47  FVC-Post L 2.57  FVC-Predicted Post % 51  Pre FEV1/FVC % % 36  Post FEV1/FCV % % 38  FEV1-Pre L 0.85  FEV1-Predicted Pre % 22  FEV1-Post L 0.99  DLCO UNC% % 54  DLCO COR %Predicted % 81  TLC L 7.49  TLC % Predicted % 107  RV % Predicted % 225    WALK:  SIX MIN WALK 07/30/2019  Supplimental Oxygen during Test? (L/min) Yes  O2 Flow Rate 3  Type Continuous  Tech Comments: Patient walked a an average pace. He dropped to 90% on RA but then recovered to 91% on lap 2 with 3L of O2. Patient did not need any breaks and had no complaints of SOB at the end of 3 laps.    Imaging: No results found.  Lab Results:  CBC    Component Value Date/Time   WBC 8.6 03/17/2014 0815   RBC 4.19 (L) 03/17/2014 0815   HGB 13.1 10/02/2016 0858   HCT 41.3 03/17/2014 0815   PLT 232 03/17/2014 0815  MCV 98.6 03/17/2014 0815   MCH 32.0 03/17/2014 0815   MCHC 32.4 03/17/2014 0815   RDW 13.7 03/17/2014 0815   LYMPHSABS 2.1 01/08/2008 1035   MONOABS 0.6 01/08/2008 1035   EOSABS 0.1 01/08/2008 1035   BASOSABS 0.0 01/08/2008 1035    BMET    Component Value Date/Time   NA 134 (L) 03/18/2014 0349   K 3.8 03/18/2014 0349   CL 95 (L) 03/18/2014 0349   CO2 32 03/18/2014 0349   GLUCOSE 99 03/18/2014 0349   BUN 29 (H) 03/18/2014 0349   CREATININE 0.75 03/18/2014 0349   CALCIUM 8.6 03/18/2014 0349   GFRNONAA >90 03/18/2014 0349   GFRAA >90 03/18/2014 0349    BNP    Component Value Date/Time   BNP 34.8  03/16/2014 1212    ProBNP    Component Value Date/Time   PROBNP 207.0 (H) 08/06/2006 1725    Specialty Problems      Pulmonary Problems   CAP (community acquired pneumonia)   COPD (chronic obstructive pulmonary disease) (HCC)   Chronic obstructive pulmonary disease with acute exacerbation (HCC)   Chronic respiratory failure with hypoxia (HCC)   OSA (obstructive sleep apnea)   SOB (shortness of breath)      No Known Allergies  Immunization History  Administered Date(s) Administered  . Influenza-Unspecified 12/22/2018    Past Medical History:  Diagnosis Date  . 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 COPD (chronic obstructive pulmonary disease) ([place])   . GERD (gastroesophageal reflux disease)   . History of [place] spotted fever age 53  . Left inguinal hernia   . Opioid abuse, in remission ([place])    PER PT IN REMISSION SINCE 2012 , BEEN ON METHADONE SINCE   . OSA (obstructive sleep apnea)    per study 06-08-2014 moderate osa / hypopnea syndrome---  PER PT "TITRATED FOR CPAP BUT WAS TOLD DID NOT NEED IT IF HE DIDN'T WANT TO"    Tobacco History: Social History   Tobacco Use  Smoking Status Former Smoker  . Packs/day: 2.00  . Years: 15.00  . Pack years: 30.00  . Types: Cigarettes  . Quit date: 09/27/2014  . Years since quitting: 4.9  Smokeless Tobacco Never Used   Counseling given: Yes   Continue to not smoke  Outpatient Encounter Medications as of 09/15/2019  Medication Sig  . albuterol (PROAIR HFA) 108 (90 Base) MCG/ACT inhaler Inhale into the lungs.  Marland Kitchen BREZTRI AEROSPHERE 160-9-4.8 MCG/ACT AERO   . cyclobenzaprine (FLEXERIL) 10 MG tablet Take 1 tablet (10 mg total) by mouth at bedtime as needed for muscle spasms.  Marland Kitchen esomeprazole (NEXIUM) 40 MG capsule Take 40 mg by mouth as needed.   . fluticasone furoate-vilanterol (BREO ELLIPTA) 100-25 MCG/INH AEPB Inhale 1 puff into the lungs every morning.  Marland Kitchen ibuprofen (ADVIL,MOTRIN) 800  MG tablet Take 1 tablet (800 mg total) by mouth every 8 (eight) hours as needed.  . methadone (DOLOPHINE) 10 MG/ML solution Take 110 mg by mouth every morning.   . Oxycodone HCl 10 MG TABS Take 1 tablet (10 mg total) by mouth every 4 (four) hours as needed (for pain).  . Tiotropium Bromide Monohydrate (SPIRIVA RESPIMAT) 2.5 MCG/ACT AERS Inhale 2 puffs into the lungs every morning.   No facility-administered encounter medications on file as of 09/15/2019.     Review of Systems  Review of Systems  Constitutional: Positive for fatigue. Negative for activity change, chills, fever and unexpected  weight change.  HENT: Negative for postnasal drip, rhinorrhea, sinus pressure, sinus pain and sore throat.   Eyes: Negative.   Respiratory: Positive for shortness of breath. Negative for cough and wheezing.   Cardiovascular: Negative for chest pain and palpitations.  Gastrointestinal: Negative for constipation, diarrhea, nausea and vomiting.  Endocrine: Negative.   Genitourinary: Negative.   Musculoskeletal: Negative.   Skin: Negative.   Neurological: Negative for dizziness and headaches.  Psychiatric/Behavioral: Negative.  Negative for dysphoric mood. The patient is not nervous/anxious.   All other systems reviewed and are negative.    Physical Exam  BP 120/70 (BP Location: Left Arm, Cuff Size: Normal)   Pulse 88   Temp 98.3 F (36.8 C) (Oral)   Ht 6' (1.829 m)   Wt 216 lb (98 kg)   SpO2 95%   BMI 29.29 kg/m   Wt Readings from Last 5 Encounters:  09/15/19 216 lb (98 kg)  07/30/19 199 lb 3.2 oz (90.4 kg)  07/11/17 205 lb (93 kg)  10/02/16 219 lb (99.3 kg)  06/08/14 205 lb (93 kg)    BMI Readings from Last 5 Encounters:  09/15/19 29.29 kg/m  07/30/19 27.02 kg/m  07/11/17 27.80 kg/m  10/02/16 29.70 kg/m  06/08/14 27.80 kg/m     Physical Exam Vitals and nursing note reviewed.  Constitutional:      General: He is not in acute distress.    Appearance: Normal appearance.  He is normal weight.     Comments: Frail adult male  HENT:     Head: Normocephalic and atraumatic.     Right Ear: Hearing and external ear normal.     Left Ear: Hearing and external ear normal.     Nose: Nose normal. No mucosal edema or rhinorrhea.     Right Turbinates: Not enlarged.     Left Turbinates: Not enlarged.     Mouth/Throat:     Mouth: Mucous membranes are dry.     Pharynx: Oropharynx is clear. No oropharyngeal exudate.  Eyes:     Pupils: Pupils are equal, round, and reactive to light.  Cardiovascular:     Rate and Rhythm: Normal rate and regular rhythm.     Pulses: Normal pulses.     Heart sounds: Normal heart sounds. No murmur heard.   Pulmonary:     Effort: Pulmonary effort is normal.     Breath sounds: No decreased breath sounds, wheezing or rales.     Comments: Diminished breath sounds throughout exam Musculoskeletal:     Cervical back: Normal range of motion.     Right lower leg: Edema (2+ lower extremity edema) present.     Left lower leg: Edema (2+ lower extremity edema) present.  Lymphadenopathy:     Cervical: No cervical adenopathy.  Skin:    General: Skin is warm and dry.     Capillary Refill: Capillary refill takes less than 2 seconds.     Findings: No erythema or rash.  Neurological:     General: No focal deficit present.     Mental Status: He is alert and oriented to person, place, and time.     Motor: No weakness.     Coordination: Coordination normal.     Gait: Gait is intact. Gait normal.  Psychiatric:        Mood and Affect: Mood normal.        Behavior: Behavior normal. Behavior is cooperative.        Thought Content: Thought content normal.  Judgment: Judgment normal.       Assessment & Plan:   COPD (chronic obstructive pulmonary disease) ([place]) Reviewed pulmonary function testing with patient Showing very severe obstruction COPD Gold stage IV 30-pack-year smoking history  Plan: Walk today in office Lab work today We will  check alpha-1 Continue Judithann Sauger  Will place referral to pulmonary rehab May need to consider referral to academic center for lung volume reduction surgery or potential lung transplant 4-week follow-up with our office Continue Daliresp  With the severity of the patient's lung functioning I do believe it is reasonable that he start working with his current employer on long-term disability paperwork.  We have provided a letter today writing patient out of work for an additional 4 weeks   Chronic respiratory failure with hypoxia ([place]) Plan: Continue 2 L of O2 Walk today in office stable  SOB (shortness of breath) Suspect patient may be having fluid overload Patient has untreated obstructive sleep apnea and very severe COPD Could be component of pulmonary hypertension Weight is up 16 pounds  Plan: Lab work today   OSA (obstructive sleep apnea) Previous history of obstructive sleep apnea Maintained on oxygen Never treated with CPAP therapy Interested in starting  Plan: Split-night sleep study ordered    Return in about 4 weeks (around 10/13/2019), or if symptoms worsen or fail to improve, for Follow up with Dr. Lamonte Sakai, Follow up with Wyn Quaker FNP-C.   Lauraine Rinne, NP 09/15/2019   This appointment required 42 minutes of patient care (this includes precharting, chart review, review of results, face-to-face care, etc.).

## 2019-09-15 ENCOUNTER — Ambulatory Visit (INDEPENDENT_AMBULATORY_CARE_PROVIDER_SITE_OTHER): Payer: 59 | Admitting: Emergency Medicine

## 2019-09-15 ENCOUNTER — Other Ambulatory Visit: Payer: Self-pay

## 2019-09-15 ENCOUNTER — Ambulatory Visit (INDEPENDENT_AMBULATORY_CARE_PROVIDER_SITE_OTHER): Payer: 59 | Admitting: Pulmonary Disease

## 2019-09-15 ENCOUNTER — Telehealth: Payer: Self-pay | Admitting: Emergency Medicine

## 2019-09-15 ENCOUNTER — Encounter: Payer: Self-pay | Admitting: Pulmonary Disease

## 2019-09-15 VITALS — BP 120/70 | HR 88 | Temp 98.3°F | Ht 72.0 in | Wt 216.0 lb

## 2019-09-15 DIAGNOSIS — G4733 Obstructive sleep apnea (adult) (pediatric): Secondary | ICD-10-CM | POA: Diagnosis not present

## 2019-09-15 DIAGNOSIS — R0602 Shortness of breath: Secondary | ICD-10-CM

## 2019-09-15 DIAGNOSIS — J449 Chronic obstructive pulmonary disease, unspecified: Secondary | ICD-10-CM

## 2019-09-15 DIAGNOSIS — J9611 Chronic respiratory failure with hypoxia: Secondary | ICD-10-CM

## 2019-09-15 LAB — PULMONARY FUNCTION TEST
DL/VA % pred: 81 %
DL/VA: 3.56 ml/min/mmHg/L
DLCO cor % pred: 54 %
DLCO cor: 15.79 ml/min/mmHg
DLCO unc % pred: 54 %
DLCO unc: 15.79 ml/min/mmHg
FEF 25-75 Post: 0.5 L/sec
FEF 25-75 Pre: 0.4 L/sec
FEF2575-%Change-Post: 24 %
FEF2575-%Pred-Post: 14 %
FEF2575-%Pred-Pre: 11 %
FEV1-%Change-Post: 15 %
FEV1-%Pred-Post: 25 %
FEV1-%Pred-Pre: 22 %
FEV1-Post: 0.99 L
FEV1-Pre: 0.85 L
FEV1FVC-%Change-Post: 6 %
FEV1FVC-%Pred-Pre: 46 %
FEV6-%Change-Post: 9 %
FEV6-%Pred-Post: 51 %
FEV6-%Pred-Pre: 46 %
FEV6-Post: 2.46 L
FEV6-Pre: 2.24 L
FEV6FVC-%Change-Post: 0 %
FEV6FVC-%Pred-Post: 99 %
FEV6FVC-%Pred-Pre: 98 %
FVC-%Change-Post: 8 %
FVC-%Pred-Post: 51 %
FVC-%Pred-Pre: 47 %
FVC-Post: 2.57 L
FVC-Pre: 2.36 L
Post FEV1/FVC ratio: 38 %
Post FEV6/FVC ratio: 96 %
Pre FEV1/FVC ratio: 36 %
Pre FEV6/FVC Ratio: 95 %
RV % pred: 225 %
RV: 4.73 L
TLC % pred: 107 %
TLC: 7.49 L

## 2019-09-15 LAB — COMPREHENSIVE METABOLIC PANEL
ALT: 16 U/L (ref 0–53)
AST: 18 U/L (ref 0–37)
Albumin: 4.3 g/dL (ref 3.5–5.2)
Alkaline Phosphatase: 75 U/L (ref 39–117)
BUN: 15 mg/dL (ref 6–23)
CO2: 37 mEq/L — ABNORMAL HIGH (ref 19–32)
Calcium: 9.1 mg/dL (ref 8.4–10.5)
Chloride: 92 mEq/L — ABNORMAL LOW (ref 96–112)
Creatinine, Ser: 0.9 mg/dL (ref 0.40–1.50)
GFR: 87.97 mL/min (ref >60.00)
Glucose, Bld: 108 mg/dL — ABNORMAL HIGH (ref 70–99)
Potassium: 3.8 mEq/L (ref 3.5–5.1)
Sodium: 135 mEq/L (ref 135–145)
Total Bilirubin: 0.6 mg/dL (ref 0.2–1.2)
Total Protein: 7.7 g/dL (ref 6.0–8.3)

## 2019-09-15 LAB — CBC WITH DIFFERENTIAL/PLATELET
Basophils Absolute: 0.1 10*3/uL (ref 0.0–0.1)
Basophils Relative: 0.5 % (ref 0.0–3.0)
Eosinophils Absolute: 0.3 10*3/uL (ref 0.0–0.7)
Eosinophils Relative: 2.5 % (ref 0.0–5.0)
HCT: 39.8 % (ref 39.0–52.0)
Hemoglobin: 13.5 g/dL (ref 13.0–17.0)
Lymphocytes Relative: 23 % (ref 12.0–46.0)
Lymphs Abs: 2.3 10*3/uL (ref 0.7–4.0)
MCHC: 34 g/dL (ref 30.0–36.0)
MCV: 91.9 fl (ref 78.0–100.0)
Monocytes Absolute: 0.8 10*3/uL (ref 0.1–1.0)
Monocytes Relative: 7.6 % (ref 3.0–12.0)
Neutro Abs: 6.6 10*3/uL (ref 1.4–7.7)
Neutrophils Relative %: 66.4 % (ref 43.0–77.0)
Platelets: 297 10*3/uL (ref 150.0–400.0)
RBC: 4.33 Mil/uL (ref 4.22–5.81)
RDW: 13.8 % (ref 11.5–15.5)
WBC: 10 10*3/uL (ref 4.0–10.5)

## 2019-09-15 NOTE — Telephone Encounter (Signed)
New order placed using Dr. Lamonte Sakai as signer for pulmonary rehab. Contacted rehab and informed them of the new order.

## 2019-09-15 NOTE — Assessment & Plan Note (Signed)
Suspect patient may be having fluid overload Patient has untreated obstructive sleep apnea and very severe COPD Could be component of pulmonary hypertension Weight is up 16 pounds  Plan: Lab work today

## 2019-09-15 NOTE — Progress Notes (Signed)
PFT done today. 

## 2019-09-15 NOTE — Assessment & Plan Note (Signed)
Previous history of obstructive sleep apnea Maintained on oxygen Never treated with CPAP therapy Interested in starting  Plan: Split-night sleep study ordered

## 2019-09-15 NOTE — Assessment & Plan Note (Signed)
Plan: Continue 2 L of O2 Walk today in office stable

## 2019-09-15 NOTE — Assessment & Plan Note (Addendum)
Reviewed pulmonary function testing with patient Showing very severe obstruction COPD Gold stage IV 30-pack-year smoking history  Plan: Walk today in office Lab work today We will check alpha-1 Continue Judithann Sauger  Will place referral to pulmonary rehab May need to consider referral to academic center for lung volume reduction surgery or potential lung transplant 4-week follow-up with our office Continue Daliresp  With the severity of the patient's lung functioning I do believe it is reasonable that he start working with his current employer on long-term disability paperwork.  We have provided a letter today writing patient out of work for an additional 4 weeks

## 2019-09-15 NOTE — Patient Instructions (Addendum)
You were seen today by Lauraine Rinne, NP  for:   1. SOB (shortness of breath)  - Comp Met (CMET); Future - CBC with Differential/Platelet; Future - Pro b natriuretic peptide (BNP); Future  We will obtain lab work today to evaluate your weight increase as well as shortness of breath  2. Chronic obstructive pulmonary disease, unspecified COPD type ([place])  - Alpha-1 antitrypsin phenotype; Future  Walk today in office  Referral to pulmonary rehab  Breztri >>> 2 puffs in the morning right when you wake up, rinse out your mouth after use, 12 hours later 2 puffs, rinse after use >>> Take this daily, no matter what >>> This is not a rescue inhaler   Only use your albuterol as a rescue medication to be used if you can't catch your breath by resting or doing a relaxed purse lip breathing pattern.  - The less you use it, the better it will work when you need it. - Ok to use up to 2 puffs  every 4 hours if you must but call for immediate appointment if use goes up over your usual need - Don't leave home without it !!  (think of it like the spare tire for your car)   Note your daily symptoms > remember "red flags" for COPD:   >>>Increase in cough >>>increase in sputum production >>>increase in shortness of breath or activity  intolerance.   If you notice these symptoms, please call the office to be seen.    We will provide you a work note today stating that we believe you should be written out of work for least the next 4 weeks while we continue our work-up.  I would also encourage you to discuss with your human resources applying for long-term disability given your pulmonary function testing results today.  Long-term disability documentation needs to be routed through:  [facility] [address] [place], [place] Telephone [phone]  3. Chronic respiratory failure with hypoxia (HCC)  Walk today  Continue oxygen therapy as prescribed   >>>maintain oxygen saturations greater than 88 percent  >>>if unable to maintain oxygen saturations please contact the office  >>>do not smoke with oxygen  >>>can use nasal saline gel or nasal saline rinses to moisturize nose if oxygen causes dryness   4. OSA (obstructive sleep apnea)  We will order a split-night sleep study to further evaluate your previous history of obstructive sleep apnea  We recommend today:  Orders Placed This Encounter  Procedures  . Alpha-1 antitrypsin phenotype    Standing Status:   Future    Standing Expiration Date:   09/14/2020  . Comp Met (CMET)    Standing Status:   Future    Standing Expiration Date:   09/14/2020  . CBC with Differential/Platelet    Standing Status:   Future    Standing Expiration Date:   09/14/2020  . Pro b natriuretic peptide (BNP)    Standing Status:   Future    Standing Expiration Date:   09/14/2020   Orders Placed This Encounter  Procedures  . Alpha-1 antitrypsin phenotype  . Comp Met (CMET)  . CBC with Differential/Platelet  . Pro b natriuretic peptide (BNP)   No orders of the defined types were placed in this encounter.   Follow Up:    Return in about 4 weeks (around 10/13/2019), or if symptoms worsen or fail to improve, for Follow up with Dr. Lamonte Sakai, Follow up with Wyn Quaker FNP-C.  Please do your part to reduce the spread of COVID-19:      Reduce your risk of any infection  and COVID19 by using the similar precautions used for avoiding the common cold or flu:  Marland Kitchen Wash your hands often with soap and warm water for at least 20 seconds.  If soap and water are not readily available, use an alcohol-based hand sanitizer with at least 60% alcohol.  . If coughing or sneezing, cover your mouth and nose by coughing or sneezing into the elbow areas of your shirt or coat, into a tissue or into your sleeve (not your hands). Langley Gauss A MASK when in public  . Avoid shaking hands with others and consider head nods or verbal  greetings only. . Avoid touching your eyes, nose, or mouth with unwashed hands.  . Avoid close contact with people who are sick. . Avoid places or events with large numbers of people in one location, like concerts or sporting events. . If you have some symptoms but not all symptoms, continue to monitor at home and seek medical attention if your symptoms worsen. . If you are having a medical emergency, call 911.   [place] / e-Visit: [email or web address]         [facility]: [facility]: [phone]                   [facility]: [phone]     It is flu season:   >>> Best ways to protect herself from the flu: Receive the yearly flu vaccine, practice good hand hygiene washing with soap and also using hand sanitizer when available, eat a nutritious meals, get adequate rest, hydrate appropriately   Please contact the office if your symptoms worsen or you have concerns that you are not improving.   Thank you for choosing [facility] for your healthcare, and for allowing [place] to partner with you on your healthcare journey. I am thankful to be able to provide care to you today.   Wyn Quaker FNP-C

## 2019-09-16 ENCOUNTER — Telehealth (HOSPITAL_COMMUNITY): Payer: Self-pay

## 2019-09-16 LAB — PRO B NATRIURETIC PEPTIDE: NT-Pro BNP: 6 pg/mL (ref 0–121)

## 2019-09-16 NOTE — Addendum Note (Signed)
Addended by: Satira Sark D on: 09/16/2019 08:33 AM   Modules accepted: Orders

## 2019-09-16 NOTE — Telephone Encounter (Signed)
Pt insurance is active and benefits verified through [facility] Co-pay $17, DED 0/0 met, out of pocket $500/$102 met, co-insurance 0%. no pre-authorization required. Passport, Jen/[employer] 09/16/2019_0 :48pm, REF# [id number]

## 2019-09-16 NOTE — Telephone Encounter (Signed)
Called patient to see if he is interested in the Pulmonary Rehab Program. Patient expressed interest. Explained scheduling process and went over insurance, patient verbalized understanding. Someone from our pulmonary rehab staff will contact pt at a later time. 

## 2019-09-23 ENCOUNTER — Encounter (HOSPITAL_COMMUNITY): Payer: Self-pay | Admitting: *Deleted

## 2019-09-23 NOTE — Progress Notes (Signed)
Received referral from Dr. Lamonte Sakai  for this pt to participate in pulmonary rehab with the the diagnosis of COPD.  Pt completed PFT on 7/14 which showed FEV1 post predicted 25. Clinical review of pt follow up appt on 7/14 with Wyn Quaker NP with Dr. Lamonte Sakai Pulmonary office note.  Pt with Covid Risk Score - 2. Pt appropriate for scheduling for Pulmonary rehab.  Will forward to Pulmonary Rehab Staff  for scheduling. Cherre Huger, BSN Cardiac and Training and development officer

## 2019-09-24 LAB — ALPHA-1 ANTITRYPSIN PHENOTYPE: A-1 Antitrypsin, Ser: 174 mg/dL (ref 83–199)

## 2019-09-24 NOTE — Progress Notes (Signed)
Spoke with pt and notified of results per Dr. Wert. Pt verbalized understanding and denied any questions. 

## 2019-09-30 ENCOUNTER — Telehealth: Payer: Self-pay | Admitting: Emergency Medicine

## 2019-09-30 NOTE — Telephone Encounter (Signed)
Yes I am okay with this.  Okay to provide work note.  Patient should start evaluating long-term/short-term disability options for himself.  Wyn Quaker, FNP

## 2019-09-30 NOTE — Telephone Encounter (Signed)
Called and spoke with patients wife Clarence Murray per [place]. Patient and wife do not feel like he is ready to go back to work yet and would like to wait until he see's Dr. Lamonte Sakai next month on 11/01/19. Patient is still wearing 2 liters oxygen but is still having some shortness of breath and wife states he is having some swelling in his feet and legs. I do not see that patient is on Lasix or any diaretic.  [name] you saw patient last on 09/15/19 please advise if you are ok with patient being out of work until follow up and any other recommendations.

## 2019-09-30 NOTE — Telephone Encounter (Signed)
Called and spoke with wife Vaughan Basta that [name] is ok with writing patient a note for work to be out until follow up with Dr. Lamonte Sakai on 11/01/19. She asked for it to be faxed to his employer and also mail them a copy. Employers fax number is [phone] ITG ATTN; Arrie Aran  Letter has been done. Nothing further needed at this time.

## 2019-10-05 ENCOUNTER — Telehealth (HOSPITAL_COMMUNITY): Payer: Self-pay

## 2019-10-27 ENCOUNTER — Ambulatory Visit (HOSPITAL_BASED_OUTPATIENT_CLINIC_OR_DEPARTMENT_OTHER): Payer: 59 | Attending: Pulmonary Disease | Admitting: Pulmonary Disease

## 2019-10-27 ENCOUNTER — Other Ambulatory Visit: Payer: Self-pay

## 2019-10-27 DIAGNOSIS — G4733 Obstructive sleep apnea (adult) (pediatric): Secondary | ICD-10-CM | POA: Insufficient documentation

## 2019-10-29 ENCOUNTER — Telehealth: Payer: Self-pay | Admitting: Pulmonary Disease

## 2019-10-29 DIAGNOSIS — G4733 Obstructive sleep apnea (adult) (pediatric): Secondary | ICD-10-CM | POA: Diagnosis not present

## 2019-10-29 NOTE — Procedures (Signed)
° ° °  Patient Name: Clarence Murray, Clarence Murray Date: 10/27/2019 Gender: Male D.O.B: February 27, 1966 Age (years): 84 Referring Provider: Lauraine Rinne NP Height (inches): 74 Interpreting Physician: Chesley Mires MD, ABSM Weight (lbs): 190 RPSGT: Laren Everts BMI: 26 MRN: [id number] Neck Size: 17.00  CLINICAL INFORMATION Sleep Study Type: Split Night CPAP  Indication for sleep study: COPD, Excessive Daytime Sleepiness, Fatigue, OSA  Epworth Sleepiness Score: 4  SLEEP STUDY TECHNIQUE As per the AASM Manual for the Scoring of Sleep and Associated Events v2.3 (April 2016) with a hypopnea requiring 4% desaturations.  The channels recorded and monitored were frontal, central and occipital EEG, electrooculogram (EOG), submentalis EMG (chin), nasal and oral airflow, thoracic and abdominal wall motion, anterior tibialis EMG, snore microphone, electrocardiogram, and pulse oximetry. Continuous positive airway pressure (CPAP) was initiated when the patient met split night criteria and was titrated according to treat sleep-disordered breathing.  MEDICATIONS Medications self-administered by patient taken the night of the study : N/A  RESPIRATORY PARAMETERS Diagnostic  Total AHI (/hr): 30.0 RDI (/hr): 48.9 OA Index (/hr): 0.5 CA Index (/hr): 12.0 REM AHI (/hr): 6.0 NREM AHI (/hr): 34.4 Supine AHI (/hr): 30.0 Non-supine AHI (/hr): 0 Min O2 Sat (%): 79.0 Mean O2 (%): 89.1 Time below 88% (min): 40.8   Titration  Optimal Pressure (cm): 10 AHI at Optimal Pressure (/hr): 0.0 Min O2 at Optimal Pressure (%): 88.0 Supine % at Optimal (%): 0 Sleep % at Optimal (%): 37   SLEEP ARCHITECTURE The recording time for the entire night was 419.8 minutes.  During a baseline period of 179.5 minutes, the patient slept for 130.0 minutes in REM and nonREM, yielding a sleep efficiency of 72.4%%. Sleep onset after lights out was 24.2 minutes with a REM latency of 62.0 minutes. The patient spent 8.5%% of the night in  stage N1 sleep, 76.2%% in stage N2 sleep, 0.0%% in stage N3 and 15.4% in REM.  During the titration period of 235.5 minutes, the patient slept for 77.5 minutes in REM and nonREM, yielding a sleep efficiency of 32.9%%. Sleep onset after CPAP initiation was 58.4 minutes with a REM latency of 162.5 minutes. The patient spent 11.6%% of the night in stage N1 sleep, 75.5%% in stage N2 sleep, 0.0%% in stage N3 and 12.9% in REM.  CARDIAC DATA The 2 lead EKG demonstrated sinus rhythm. The mean heart rate was 100.0 beats per minute. Other EKG findings include: None.  LEG MOVEMENT DATA The total Periodic Limb Movements of Sleep (PLMS) were 0. The PLMS index was 0.0 .  IMPRESSIONS - Severe obstructive sleep apnea with an AHI of 30 and SpO2 low of 79%. - He had good control of his obstructive sleep apnea with CPAP 10 cm H2O. - He continued to have oxygen desaturation despite good control of sleep apnea with CPAP, and this lasted longer than 5 minutes.  He had good control of his oxygenation with the combination of CPAP and 1 liter supplemental oxygen.  DIAGNOSIS - Obstructive Sleep Apnea (G47.33)  RECOMMENDATIONS - Trial of CPAP therapy on 10 cm H2O with 1 liter supplemental oxygen. - He was fitted with a Large size Philips Respironics Full Face Mask Dreamwear mask and heated humidification.  [Electronically signed] 10/29/2019 03:00 PM  Chesley Mires MD, [place], American Board of Sleep Medicine   NPI: [id number]

## 2019-10-29 NOTE — Telephone Encounter (Signed)
Tried calling the pt and there was no answer- LMTCB x 1 and will hold in triage

## 2019-10-29 NOTE — Telephone Encounter (Signed)
10/29/2019  Please let the patient know that his sleep study has been read by Dr. Halford Chessman.  The recommendations are below:  Severe OSA with an AHI of 30  RECOMMENDATIONS - Trial of CPAP therapy on 10 cm H2O with 1 liter supplemental oxygen. - He was fitted with a Large size Philips Respironics Full Face Mask Dreamwear mask and heated humidification.   We are recommending patient start CPAP therapy.  Set pressure of 10 with 1 L of O2.  Please make sure patient has upcoming appointment with either Dr. Lamonte Sakai or myself to further discuss and review.  If the patient is agreeable to starting CPAP therapy as well as oxygen at night please let us know we can send an order in.  Patient still needs to have upcoming follow-up with Dr. Lamonte Sakai.  Wyn Quaker, FNP

## 2019-10-31 IMAGING — CT CT ABD-PELV W/ CM
2 of 5 series · 15 of 46 positions shown, 17 images · IV contrast (OMNIPAQUE)
Comparison: CT pelvis of 08/30/2016

CLINICAL DATA: Mid abdominal pain, intermittent difficulty
urinating, previous surgery for abdominal hernia with mesh 1 year
ago, pain at the surgical site

EXAM:
CT ABDOMEN AND PELVIS WITH CONTRAST
TECHNIQUE: Multidetector CT imaging of the abdomen and pelvis was performed
using the standard protocol following bolus administration of
intravenous contrast.
CONTRAST:  100mL OMNIPAQUE IOHEXOL 300 MG/ML  SOLN

[Series 2: axial st · axial · 0.82mm/px · z∈[-672,-307]mm · 12 of 85 slices shown, 14 images]
[im 6/85  soft-tissue]
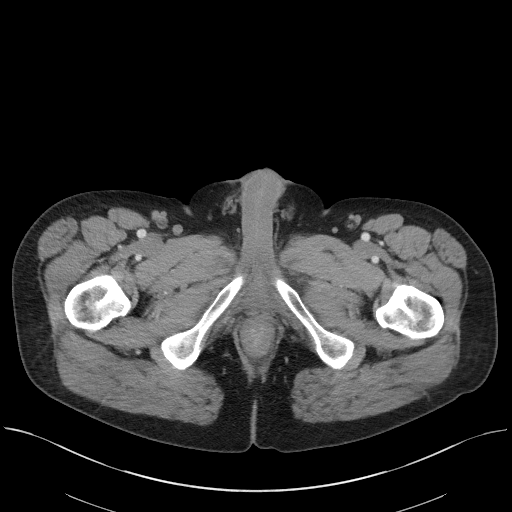
[im 6/85  bone]
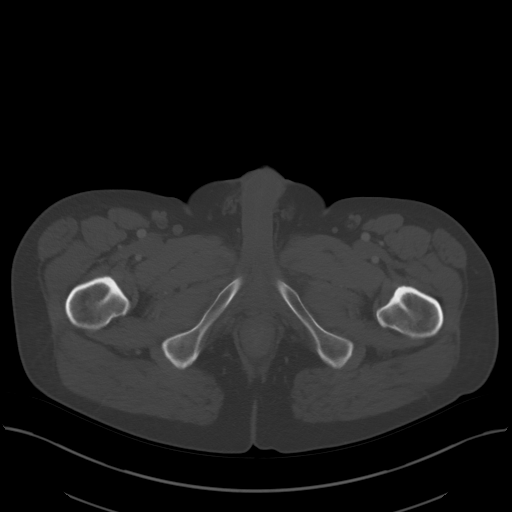
[im 11/85  soft-tissue]
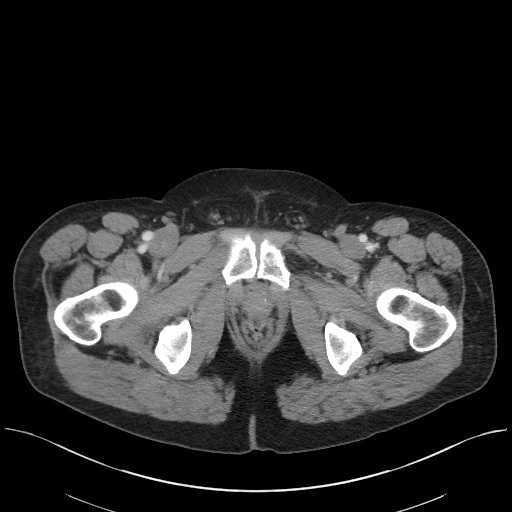
[im 22/85  soft-tissue]
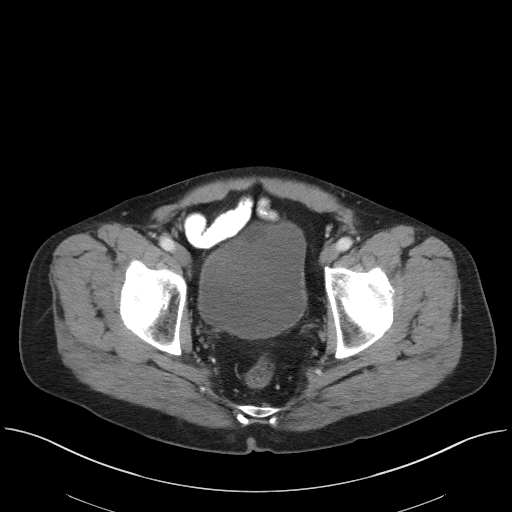
[im 27/85  soft-tissue]
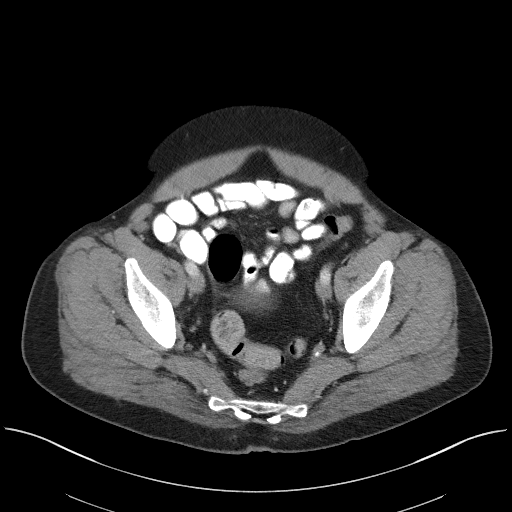
[im 32/85  soft-tissue]
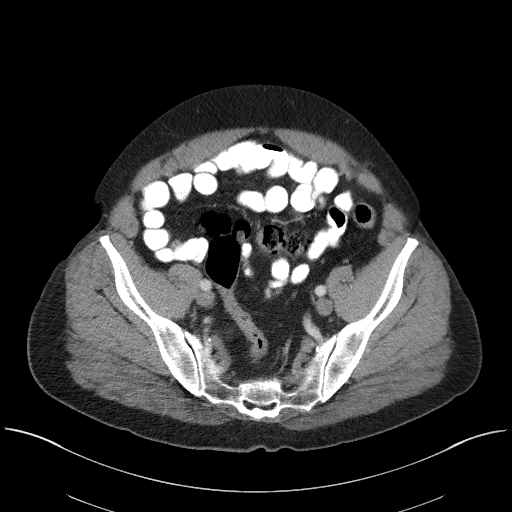
[im 37/85  soft-tissue]
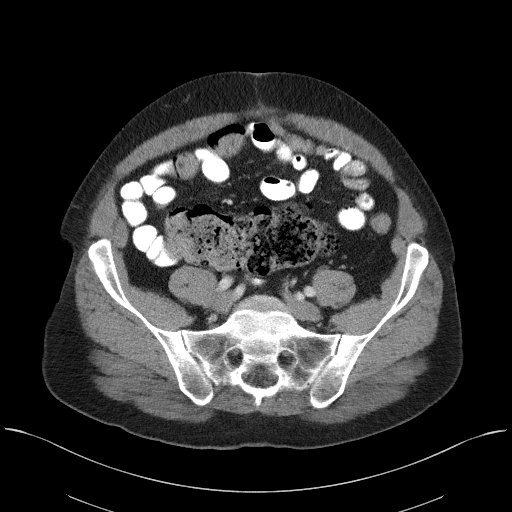
[im 48/85  soft-tissue]
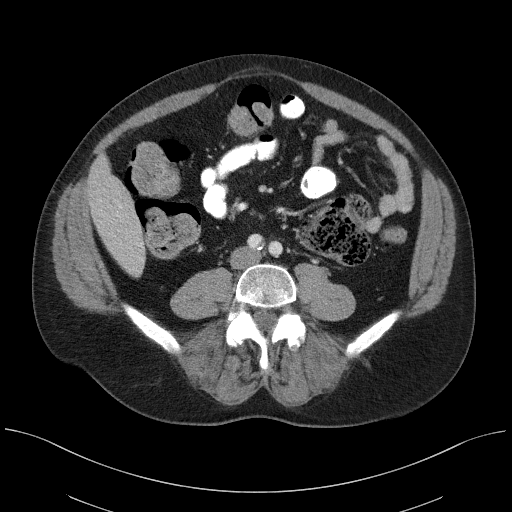
[im 53/85  soft-tissue]
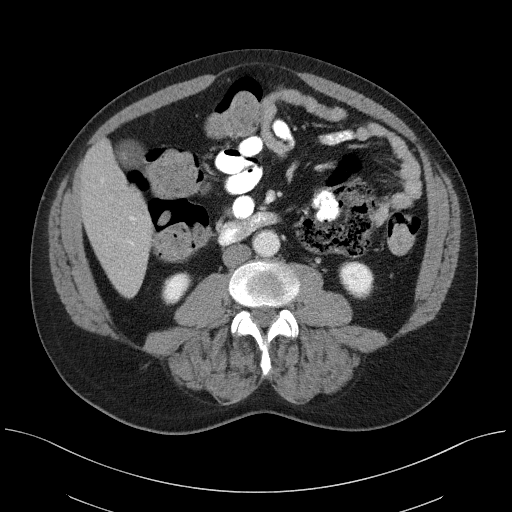
[im 58/85  soft-tissue]
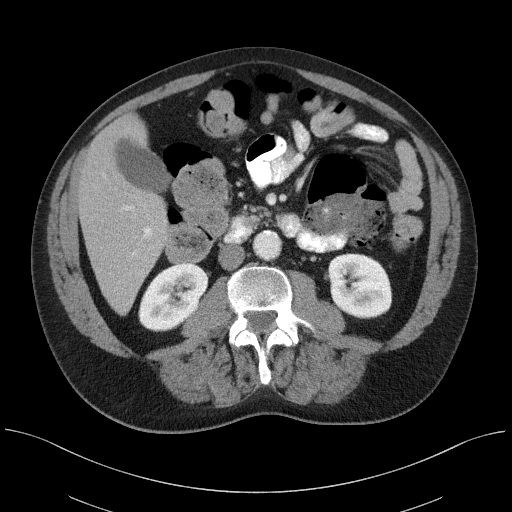
[im 58/85  bone]
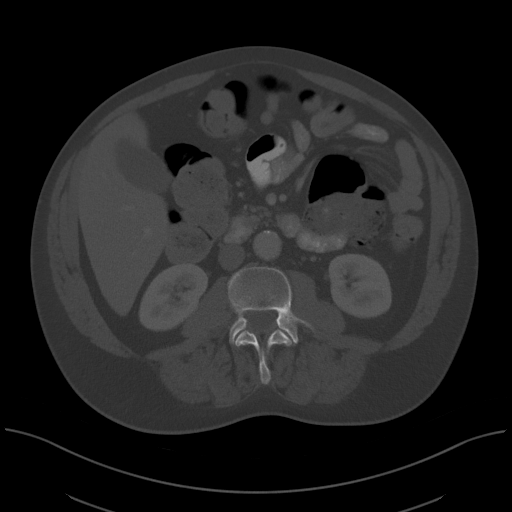
[im 64/85  soft-tissue]
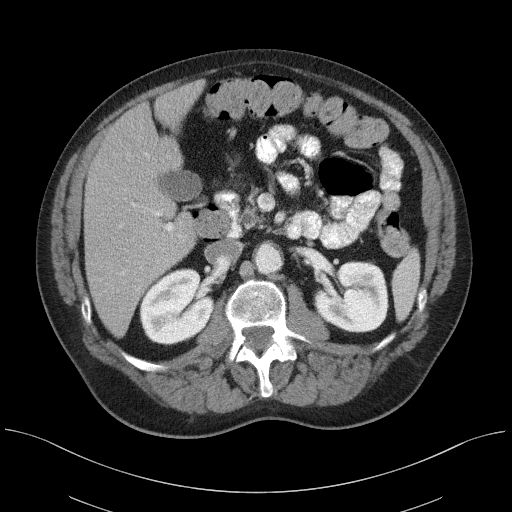
[im 74/85  soft-tissue]
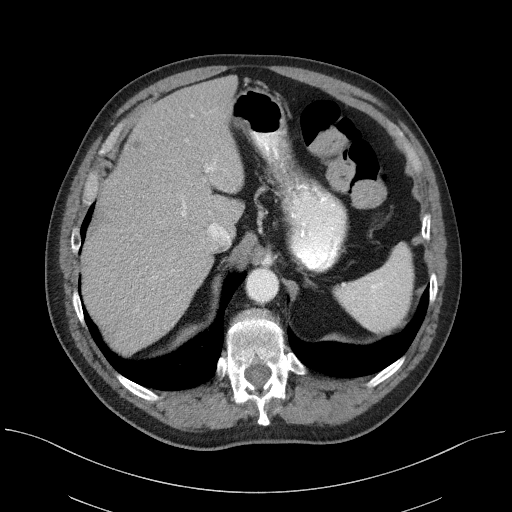
[im 79/85  soft-tissue]
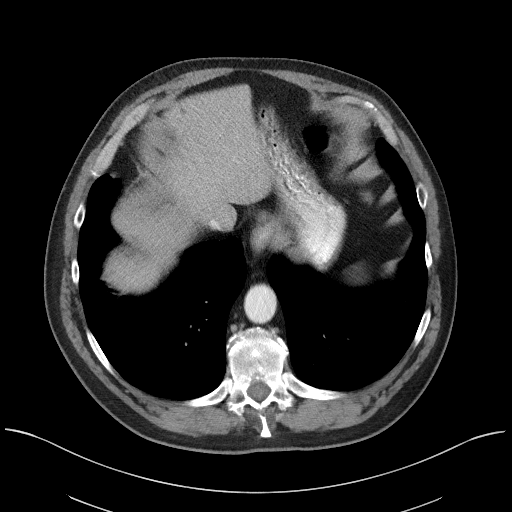

[Series 5: coronal st · coronal · 0.74mm/px · 3 of 115 slices shown]
[im 39/115  soft-tissue]
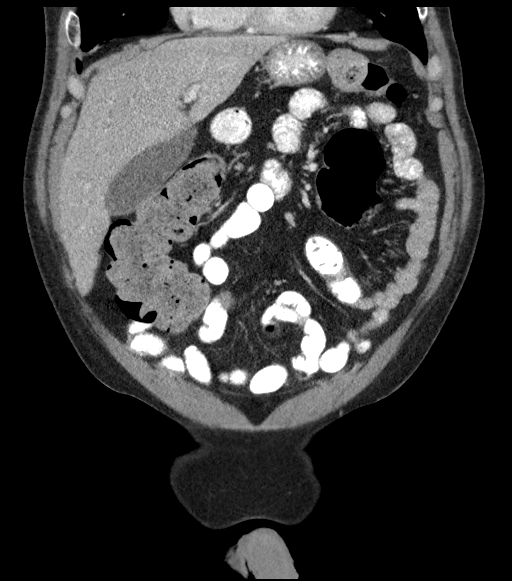
[im 51/115  soft-tissue]
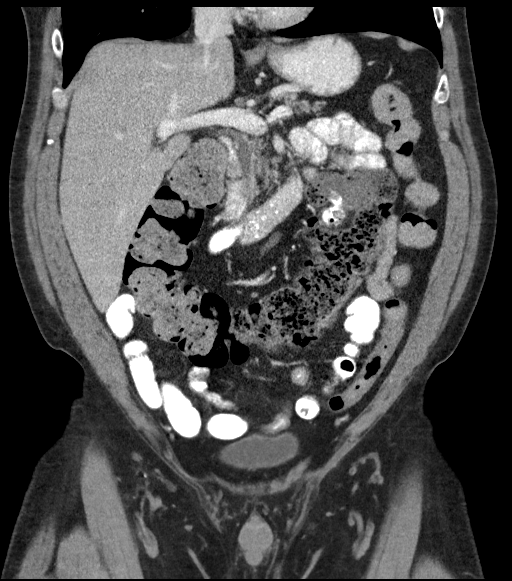
[im 64/115  soft-tissue]
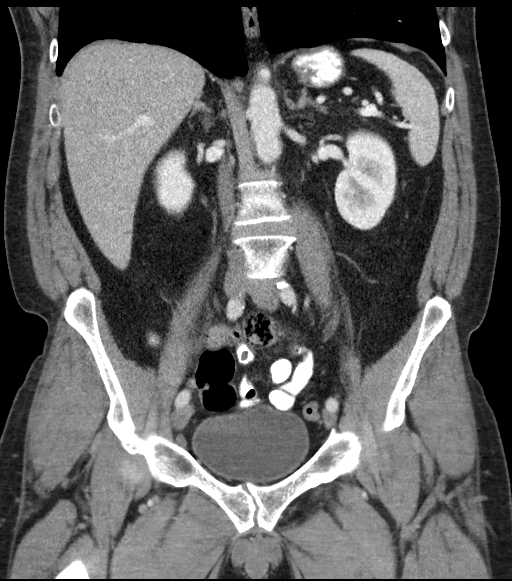

[15 of 46 positions shown; findings below may reference images not displayed]

FINDINGS: Lower chest: The lung bases are clear. There appears to be mild
bronchiectatic change particularly in the right lower lobe which may
be postinflammatory postinfectious in origin. No pneumonia is seen
and no pleural effusion is present. The heart is within normal
limits in size. No pericardial effusion is seen.

Hepatobiliary: The liver enhances and there are 2 subcentimeter
low-attenuation structures near the dome anteriorly consistent with
benign process such as cyst but too small characterize. No calcified
gallstones are seen. However, better seen on the coronal images,
noncalcified gallstones or polyps may be present near the fundus of
the gallbladder.

Pancreas: The pancreas appears somewhat atrophic. The pancreatic
duct is not dilated.

Spleen: The spleen is unremarkable.

Adrenals/Urinary Tract: The adrenal glands appear normal. The
kidneys enhance with no calculus or mass noted. On delayed images,
the pelvocaliceal systems appear normal. The ureters are normal in
caliber. The urinary bladder is not well distended but no
abnormality is evident.

Stomach/Bowel: The stomach is slightly distended with oral contrast
without significant abnormality. No small bowel lesion is seen.
There are a few rectosigmoid colon diverticula but no diverticulitis
is evident. There is a moderate amount feces throughout the colon.
The cecum is very medially positioned, extending to the left lower
abdomen possibly on a long mesentery. The terminal ileum is
unremarkable. The appendix courses medially and appears normal.

Vascular/Lymphatic: The abdominal aorta is normal in caliber with
moderate abdominal aortic atherosclerosis present. No adenopathy is
seen.

Reproductive: The prostate is normal in size with a single
calcification noted.

Other: No abdominal wall hernia is seen. Also no Raggad is evident
within the anterior abdominal wall.

Musculoskeletal: The lumbar vertebrae are somewhat lordotic but
intervertebral disc spaces appear normal. There are degenerative
changes involving the facet joints of L4-5 and L5-S1. The SI joints
are well corticated.
IMPRESSION: 1. No explanation for the patient's abdominal pain is seen.
2. Mild bronchiectatic change particularly within the right lower
lobe possibly postinflammatory or postinfectious in origin.
3. Noncalcified gallstones or polyps in the fundus of the
gallbladder. Consider ultrasound of the abdomen to assess further.
4. No renal or ureteral calculi are seen.
5. The terminal ileum and the appendix are unremarkable. The cecum
is noted to be very medially positioned extending into the left
abdomen, possibly on a long mesentery.
6. Moderate abdominal aortic atherosclerosis.

## 2019-11-01 ENCOUNTER — Other Ambulatory Visit: Payer: Self-pay

## 2019-11-01 ENCOUNTER — Telehealth: Payer: Self-pay | Admitting: Emergency Medicine

## 2019-11-01 ENCOUNTER — Ambulatory Visit: Payer: 59 | Admitting: Emergency Medicine

## 2019-11-01 ENCOUNTER — Encounter: Payer: Self-pay | Admitting: *Deleted

## 2019-11-01 ENCOUNTER — Encounter: Payer: Self-pay | Admitting: Emergency Medicine

## 2019-11-01 DIAGNOSIS — J449 Chronic obstructive pulmonary disease, unspecified: Secondary | ICD-10-CM | POA: Diagnosis not present

## 2019-11-01 DIAGNOSIS — J9611 Chronic respiratory failure with hypoxia: Secondary | ICD-10-CM

## 2019-11-01 DIAGNOSIS — G4733 Obstructive sleep apnea (adult) (pediatric): Secondary | ICD-10-CM

## 2019-11-01 NOTE — Patient Instructions (Signed)
Please continue your inhaled medications as you have been taking them Continue your Daliresp as you have been taking it continue oxygen at 2-3 L/min depending on your level of exertion. We will order CPAP, 10 cmH2O with 1 L/min bled in. Follow with Dr. Lamonte Sakai in 2 months to discuss your breathing and also to review your CPAP usage Get your 2nd COVID-19 vaccine as planned I do not believe you will be able to return to work given the severity of your COPD, your oxygen needs.  You will need to speak with your human resources experts at work to get the appropriate paperwork to start the disability process.  We will try to help you with this next visit.

## 2019-11-01 NOTE — Telephone Encounter (Signed)
Checked with Gavin Potters who is working with [facility] today to see if letter was faxed after OV and she stated that she placed letter in fax file to be faxed after OV. Called and spoke with pt's wife Vaughan Basta letting her know this info but stated to her that I was going to go ahead and refax letter in case the fax did not go through and she verbalized understanding. Letter has been faxed to pt's job. Nothing further needed.

## 2019-11-01 NOTE — Progress Notes (Signed)
error 

## 2019-11-01 NOTE — Assessment & Plan Note (Signed)
Confirmed on PSG from 10/27/2019.  He was titrated to 10 cm water with 1 L/min bled in.  We will order this for him today and I'll follow up with him in about 2 months for confirmation of compliance

## 2019-11-01 NOTE — Assessment & Plan Note (Signed)
Titrated to 2 to 3 L/min.  He also needs 1 L/min with a CPAP at night while sleeping

## 2019-11-01 NOTE — Telephone Encounter (Signed)
Called and spoke with pt in regards to results of HST and recommendations stated by [name]. Pt stated that he had an  OV today 8/30 with [facility] and the machine was ordered during visit. Nothing further needed.

## 2019-11-01 NOTE — Assessment & Plan Note (Addendum)
Very severe obstruction with chronic hypoxic and hypercapnic respiratory failure.  Is been titrated to 2 to 3 L/min and is using reliably now.  He has severe dyspnea and sometimes dizziness when he doesn't wear it.  I don't think he is going to be able to go back to work.  I have asked him to talk to his human resources department at work to start the disability process.  I'll write a note to indicate that he shouldn't go back at least until the next time I see him in office.  We'll continue his current bronchodilator regimen, continue Daliresp.  He had the 1st Covid vaccine and is set to get the 2nd.  Follow-up in 2 months.  A referral was made to pulmonary rehab in July but he hasn't heard anything about getting this scheduled yet.

## 2019-11-01 NOTE — Progress Notes (Signed)
Subjective:    Patient ID: Clarence Murray, male    DOB: 1965/08/03, 54 y.o.   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 Referred today for evaluation of COPD.  Had PNA in early April. Persistent dyspnea following. He has been admitted for resp failure in 2016 - required BiPAP.  Was treated with levaquin and pred again 06/22/19. He is still having exertional SOB. Hears wheeze and has cough in the am - yellow thick. Several exacerbations in the last year. His exercise tolerance has declined over the last years. He was found to be hypoxemic at his last PCP OV >> was given O2 to use w exertion. Does not snore - has had witnessed apneas.   Was on Spiriva Respimat, Breo >> changed to [place]. Feels that he is benefiting from this.   daliresp > has been on this for years  ROV 11/01/19 --follow-up visit 54 year old man, former smoker, with GERD, history of opioid use in remission, OSA not on CPAP.  COPD diagnosed after pneumonia in April 2021, very severe obstruction confirmed on PFT 09/15/2019.  Has been maintained on oxygen at 2 L/min with exertion.  Currently managed on Breztri, Daliresp.  At his last visit here in July discussed possible referral for pulmonary rehab. Using albuterol 2-3x a week. He has significant exertional SOB when he tries to walk without his O2.  Alpha-1 antitrypsin genotype MM, level 174 Split-night sleep study was done on 10/27/2019, confirmed with severe obstructive sleep apnea with an AHI of 30 and associated desaturation.  He was titrated to a pressure of 10 cmH2O with 1 L/min bled in.  He used a large size [employer] full facemask DreamWear mask with heated humidity.   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 He is going to try to get disability.    Review of Systems As per HPI  Past Medical History:   Diagnosis Date   Chronic pain    work injury 02/ 2002 annuler tear C5-6 disk   COPD (chronic obstructive pulmonary disease) (HCC)    GERD (gastroesophageal reflux disease)    History of [place] spotted fever age 7   Left inguinal hernia    Opioid abuse, in remission ([place])    PER PT IN REMISSION SINCE 2012 , BEEN ON METHADONE SINCE    OSA (obstructive sleep apnea)    per study 06-08-2014 moderate osa / hypopnea syndrome---  PER PT "TITRATED FOR CPAP BUT WAS TOLD DID NOT NEED IT IF HE DIDN'T WANT TO"     Family History  Problem Relation Age of Onset   Cancer Mother        Lung   Heart failure Maternal Grandmother    Heart attack Father        death at age 33 from MI   Heart attack Sister        death at age 46   COPD Brother      Social History   Socioeconomic History   Marital status: Married    Spouse name: Not on file   Number of children: Not on file   Years of education: Not on file   Highest education level: Not on file  Occupational History    Employer: [employer]  Tobacco Use   Smoking status: Former  Smoker    Packs/day: 2.00    Years: 15.00    Pack years: 30.00    Types: Cigarettes    Quit date: 09/27/2014    Years since quitting: 5.0   Smokeless tobacco: Never Used  Vaping Use   Vaping Use: Never used  Substance and Sexual Activity   Alcohol use: No    Alcohol/week: 0.0 standard drinks   Drug use: No    Comment: IN REMISSION OPIOID ABUSE SINCE 2012 PER PT    Sexual activity: Not on file  Other Topics Concern   Not on file  Social History Narrative   Works for a tobacco company, 3rd shift   Social Determinants of Radio broadcast assistant Strain:    Difficulty of Paying Living Expenses: Not on file  Food Insecurity:    Worried About Charity fundraiser in the Last Year: Not on file   [employer] of Food in the Last Year: Not on file  Transportation Needs:    Lack of Transportation (Medical): Not on file    Lack of Transportation (Non-Medical): Not on file  Physical Activity:    Days of Exercise per Week: Not on file   Minutes of Exercise per Session: Not on file  Stress:    Feeling of Stress : Not on file  Social Connections:    Frequency of Communication with Friends and Family: Not on file   Frequency of Social Gatherings with Friends and Family: Not on file   Attends Religious Services: Not on file   Active Member of Clubs or Organizations: Not on file   Attends Archivist Meetings: Not on file   Marital Status: Not on file  Intimate Partner Violence:    Fear of Current or Ex-Partner: Not on file   Emotionally Abused: Not on file   Physically Abused: Not on file   Sexually Abused: Not on file    Has worked as a Dealer, and for tobacco company. Some brake exposure No military From [place].   No Known Allergies   Outpatient Medications Prior to Visit  Medication Sig Dispense Refill   albuterol (PROAIR HFA) 108 (90 Base) MCG/ACT inhaler Inhale into the lungs.     BREZTRI AEROSPHERE 160-9-4.8 MCG/ACT AERO      esomeprazole (NEXIUM) 40 MG capsule Take 40 mg by mouth as needed.      ibuprofen (ADVIL,MOTRIN) 800 MG tablet Take 1 tablet (800 mg total) by mouth every 8 (eight) hours as needed. 30 tablet 0   methadone (DOLOPHINE) 10 MG/ML solution Take 110 mg by mouth every morning.      cyclobenzaprine (FLEXERIL) 10 MG tablet Take 1 tablet (10 mg total) by mouth at bedtime as needed for muscle spasms. (Patient not taking: Reported on 11/01/2019) 10 tablet 0   fluticasone furoate-vilanterol (BREO ELLIPTA) 100-25 MCG/INH AEPB Inhale 1 puff into the lungs every morning. (Patient not taking: Reported on 11/01/2019)     Oxycodone HCl 10 MG TABS Take 1 tablet (10 mg total) by mouth every 4 (four) hours as needed (for pain). (Patient not taking: Reported on 11/01/2019) 30 tablet 0   Tiotropium Bromide Monohydrate (SPIRIVA RESPIMAT) 2.5 MCG/ACT AERS Inhale 2 puffs  into the lungs every morning. (Patient not taking: Reported on 11/01/2019)     No facility-administered medications prior to visit.        Objective:   Physical Exam Vitals:   11/01/19 0914  BP: 140/80  Pulse: (!) 102  Temp: (!) 96.9 F (  36.1 C)  TempSrc: Temporal  SpO2: 98%  Weight: 214 lb (97.1 kg)  Height: 6' (1.829 m)    Gen: Pleasant, chronically ill appearing, in no distress,  normal affect on o2  ENT: No lesions,  mouth clear,  oropharynx clear, no postnasal drip  Neck: No JVD, no stridor  Lungs: No use of accessory muscles, very distant, no wheeze.   Cardiovascular: RRR, heart sounds normal, no murmur or gallops, no peripheral edema  Musculoskeletal: No deformities, no cyanosis or clubbing, lipoma on L mid back  Neuro: alert, awake, non focal  Skin: Warm, no lesions or rash      Assessment & Plan:  COPD (chronic obstructive pulmonary disease) (HCC) Very severe obstruction with chronic hypoxic and hypercapnic respiratory failure.  Is been titrated to 2 to 3 L/min and is using reliably now.  He has severe dyspnea and sometimes dizziness when he doesn't wear it.  I don't think he is going to be able to go back to work.  I have asked him to talk to his human resources department at work to start the disability process.  I'll write a note to indicate that he shouldn't go back at least until the next time I see him in office.  We'll continue his current bronchodilator regimen, continue Daliresp.  He had the 1st Covid vaccine and is set to get the 2nd.  Follow-up in 2 months.  A referral was made to pulmonary rehab in July but he hasn't heard anything about getting this scheduled yet.  Chronic respiratory failure with hypoxia (HCC) Titrated to 2 to 3 L/min.  He also needs 1 L/min with a CPAP at night while sleeping  OSA (obstructive sleep apnea) Confirmed on PSG from 10/27/2019.  He was titrated to 10 cm water with 1 L/min bled in.  We will order this for him today and  I'll follow up with him in about 2 months for confirmation of compliance  Baltazar Apo, MD, PhD 11/01/2019, 9:36 AM [facility] [phone] or if no answer [phone]

## 2019-11-13 IMAGING — US US ABDOMEN LIMITED
1 series · 14 of 25 positions shown · non-contrast
Comparison: CT abdomen pelvis of 06/18/2017

CLINICAL DATA: Abdominal pain for 1 month

EXAM:
ULTRASOUND ABDOMEN LIMITED RIGHT UPPER QUADRANT

[Series 1: us abdomen limited · 14 of 85 slices shown]
[im 1/85]
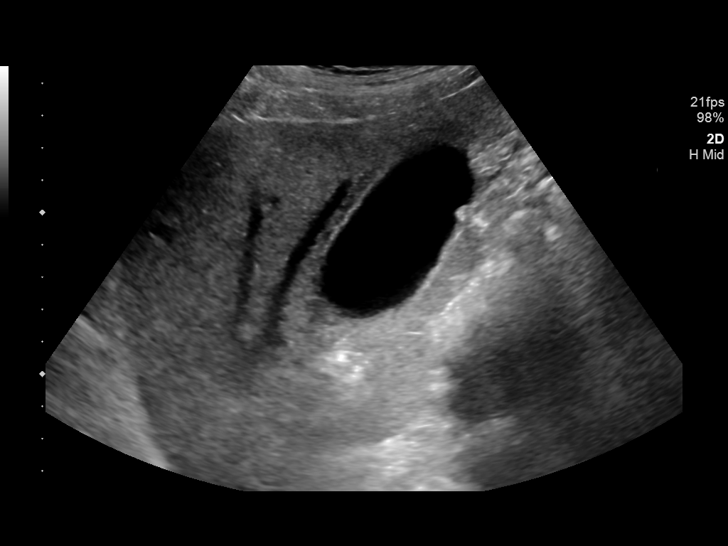
[im 8/85]
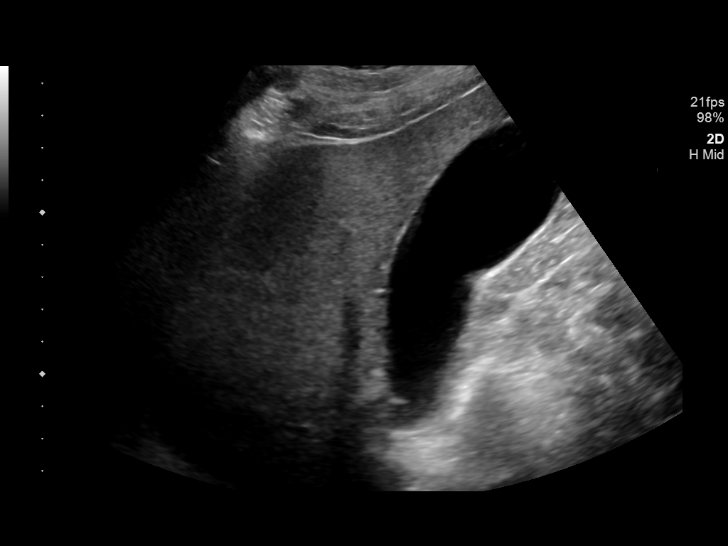
[im 15/85]
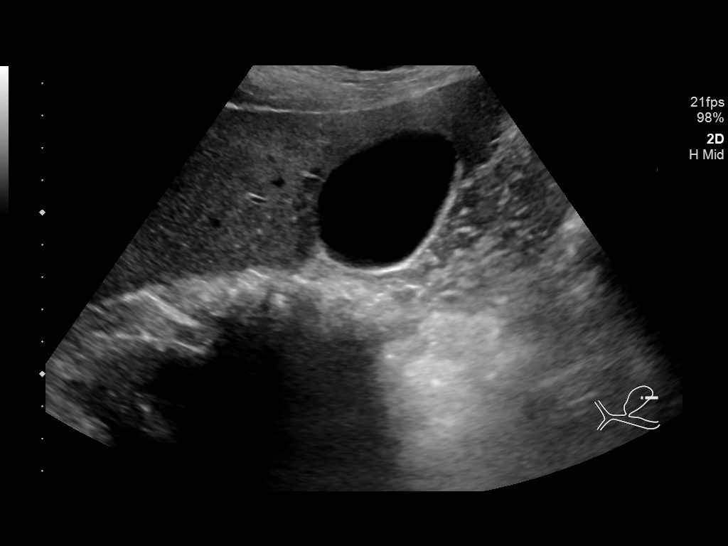
[im 22/85]
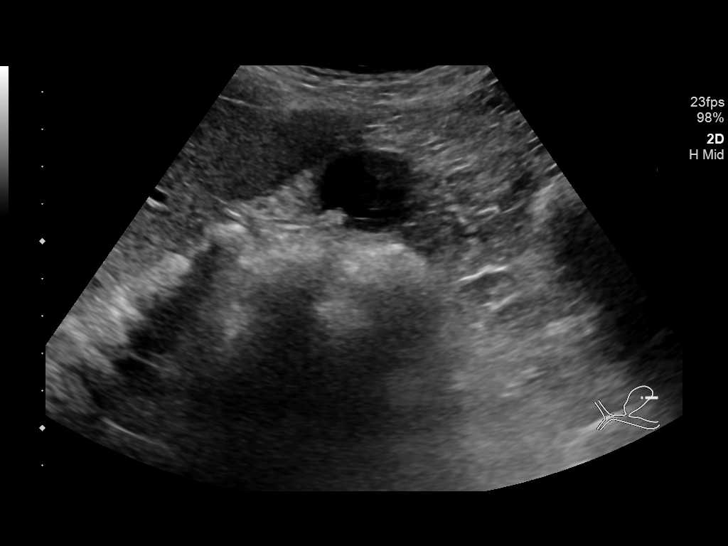
[im 29/85]
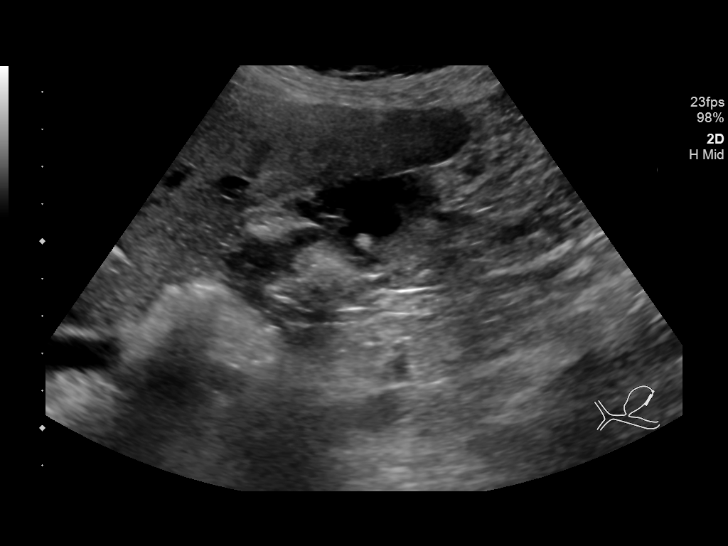
[im 32/85]
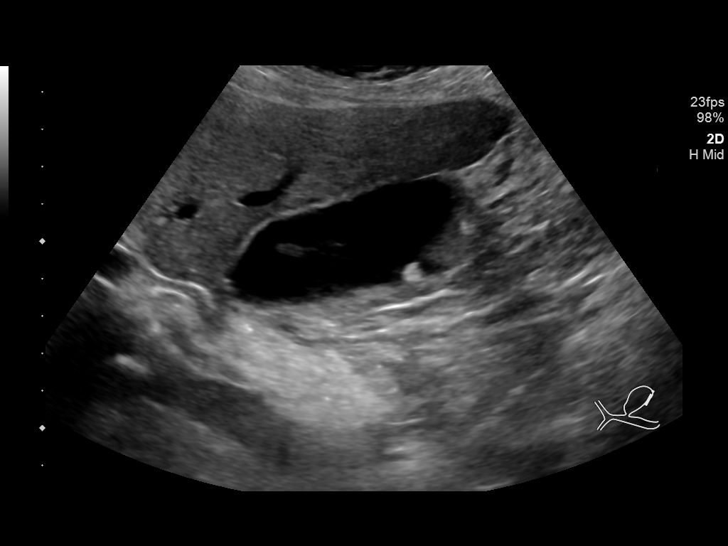
[im 39/85]
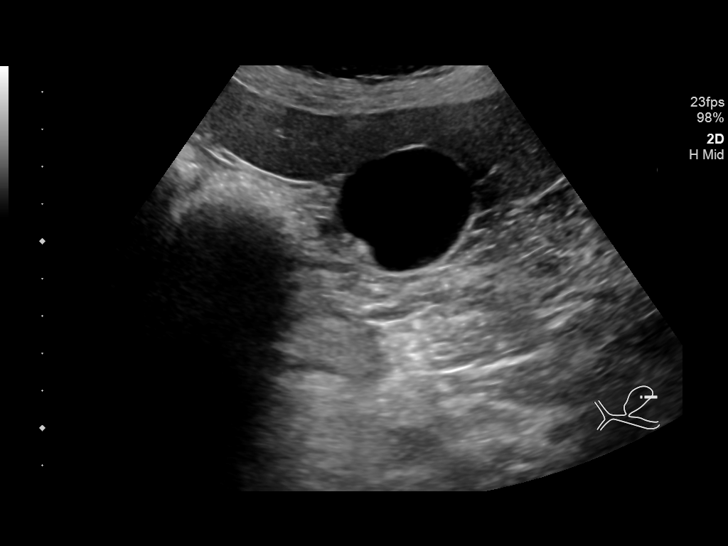
[im 46/85]
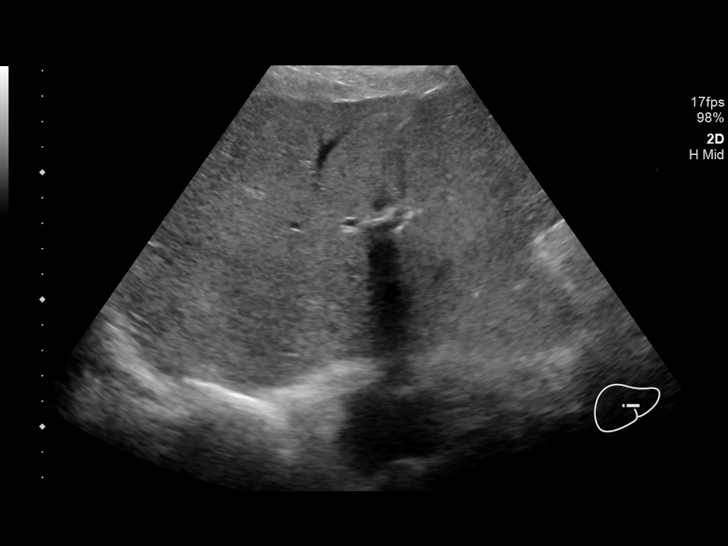
[im 53/85]
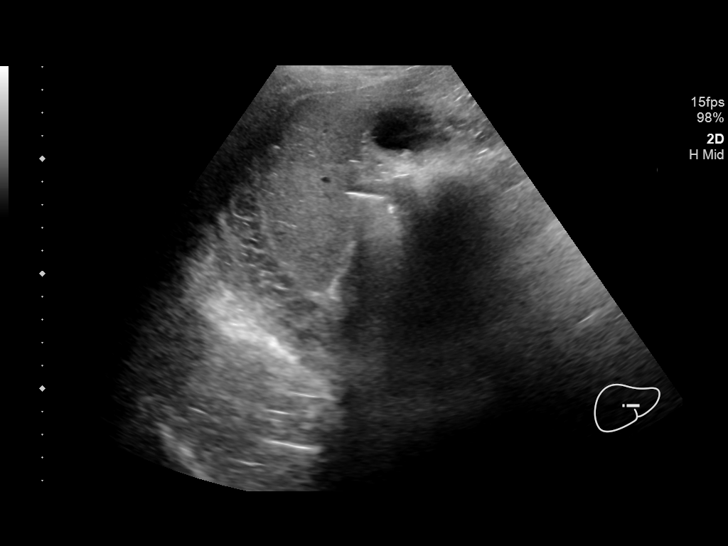
[im 57/85]
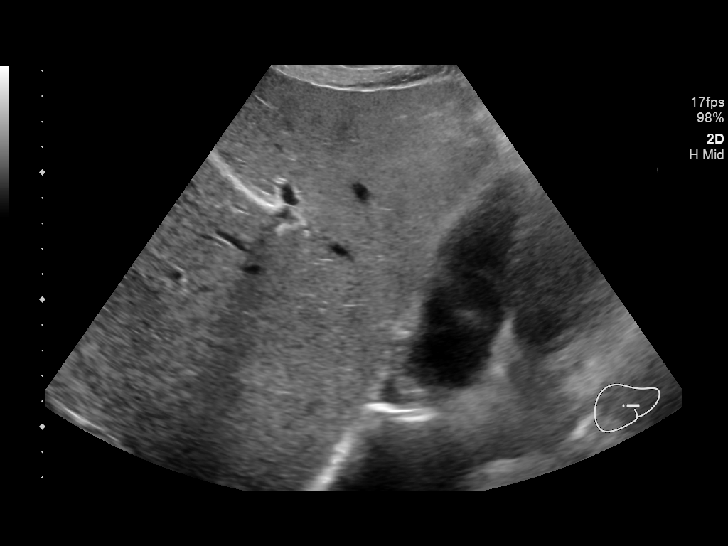
[im 64/85]
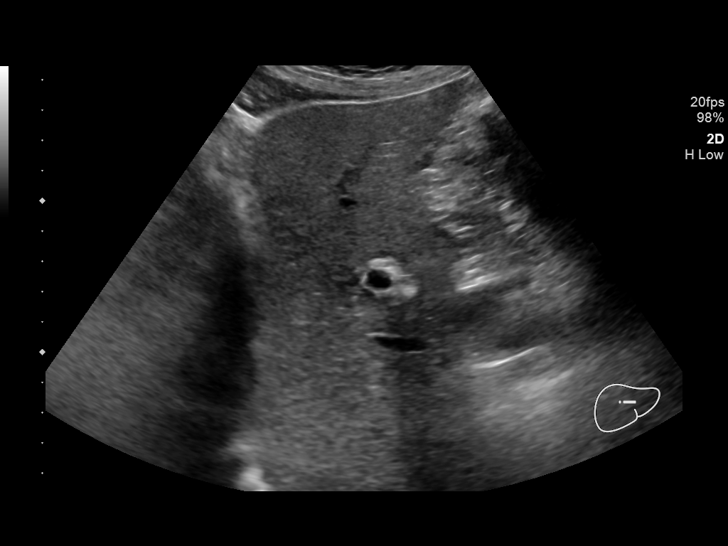
[im 71/85]
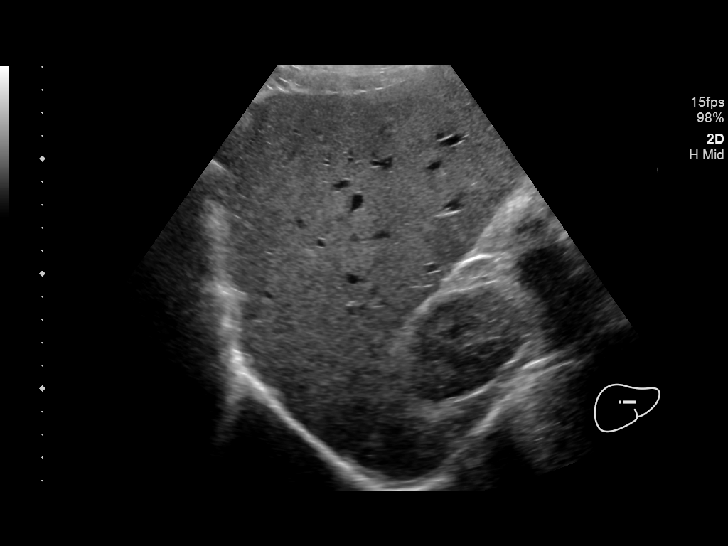
[im 78/85]
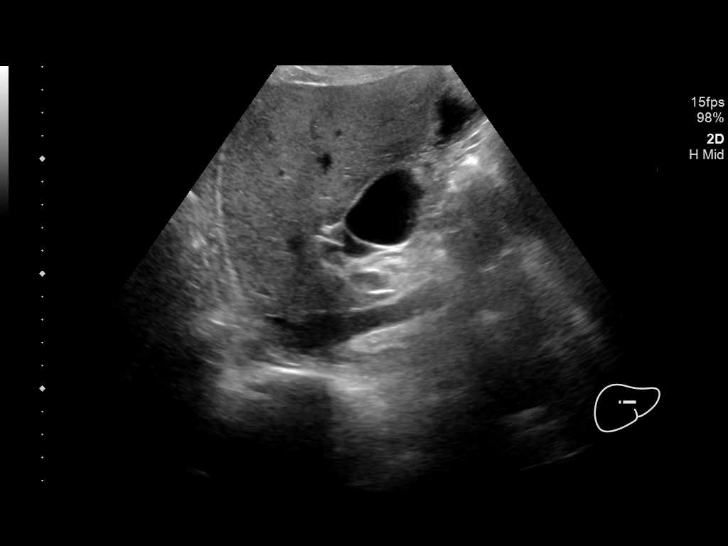
[im 85/85]
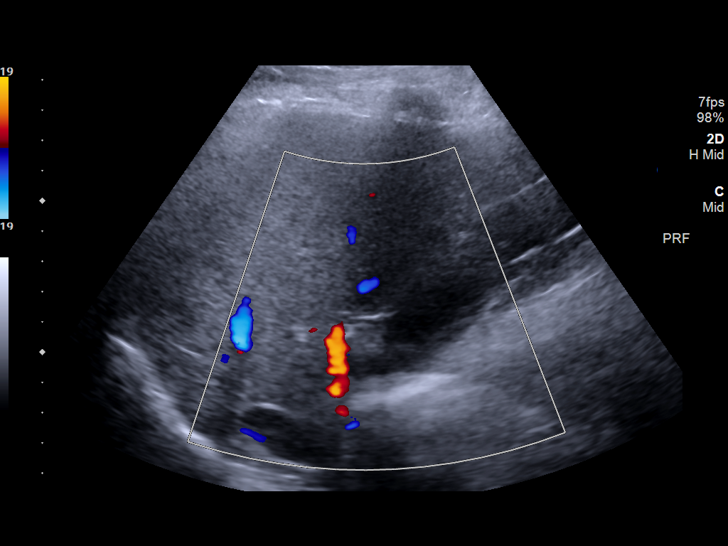

[14 of 25 positions shown; findings below may reference images not displayed]

FINDINGS: Gallbladder:

The gallbladder is well visualized and there are a few gallstones
present with some acoustical shadowing. The largest gallstone
measures 6 mm in diameter. A small amount of gallbladder sludge also
is noted. There is no pain over the gallbladder with compression.

Common bile duct:

Diameter: Common bile duct is within normal limits measuring 6.1 mm
in diameter.

Liver:

The parenchyma of the liver is somewhat echogenic and inhomogeneous
suggesting hepatic steatosis. A tiny cyst is noted in the anterior
left lobe of 1.0 cm in diameter portal vein is patent on color
Doppler imaging with normal direction of blood flow towards the
liver.
IMPRESSION: 1. Small gallstones.  No pain over the gallbladder.
2. Echogenic inhomogeneous hepatic parenchyma most consistent with
hepatic steatosis.

## 2019-11-16 ENCOUNTER — Telehealth: Payer: Self-pay | Admitting: Emergency Medicine

## 2019-11-16 DIAGNOSIS — J9611 Chronic respiratory failure with hypoxia: Secondary | ICD-10-CM

## 2019-11-16 DIAGNOSIS — J449 Chronic obstructive pulmonary disease, unspecified: Secondary | ICD-10-CM

## 2019-11-16 DIAGNOSIS — G4733 Obstructive sleep apnea (adult) (pediatric): Secondary | ICD-10-CM

## 2019-11-16 NOTE — Telephone Encounter (Signed)
Instructions from last OV 11/01/19  Please continue your inhaled medications as you have been taking them Continue your Daliresp as you have been taking it continue oxygen at 2-3 L/min depending on your level of exertion. We will order CPAP, 10 cmH2O with 1 L/min bled in. Follow with Dr. Lamonte Sakai in 2 months to discuss your breathing and also to review your CPAP usage Get your 2nd COVID-19 vaccine as planned I do not believe you will be able to return to work given the severity of your COPD, your oxygen needs.  You will need to speak with your human resources experts at work to get the appropriate paperwork to start the disability process.  We will try to help you with this next visit.     Looked at last OV and saw that no order for cpap was placed. I have now placed this order. Called and spoke with pt letting him know that I was fixing the order that was missed and we will send this to DME. Pt verbalized understanding. Nothing further needed.

## 2019-12-20 ENCOUNTER — Encounter: Payer: Self-pay | Admitting: Emergency Medicine

## 2019-12-20 ENCOUNTER — Other Ambulatory Visit: Payer: Self-pay

## 2019-12-20 ENCOUNTER — Ambulatory Visit: Payer: 59 | Admitting: Emergency Medicine

## 2019-12-20 VITALS — BP 120/80 | HR 108 | Temp 98.1°F | Ht 72.0 in | Wt 227.8 lb

## 2019-12-20 DIAGNOSIS — G4733 Obstructive sleep apnea (adult) (pediatric): Secondary | ICD-10-CM

## 2019-12-20 DIAGNOSIS — J449 Chronic obstructive pulmonary disease, unspecified: Secondary | ICD-10-CM

## 2019-12-20 DIAGNOSIS — Z23 Encounter for immunization: Secondary | ICD-10-CM | POA: Diagnosis not present

## 2019-12-20 NOTE — Assessment & Plan Note (Signed)
Please continue Breztri 2 puffs twice a day.  Rinse and gargle after using. Keep your albuterol available use 2 puffs when you need if shortness of breath, chest tightness, wheezing. Please continue your Daliresp once daily as you have been taking it. Get your second COVID-19 vaccine as planned Flu shot today We will document your disability today, write a letter to confirm that you cannot return to work. Agree with your plans for retirement Follow with Dr. Lamonte Sakai in 2 months or sooner if you have any problems.

## 2019-12-20 NOTE — Progress Notes (Signed)
Subjective:    Patient ID: Clarence Murray, male    DOB: 12-Jan-1966, 54 y.o.   MRN: [id number]  HPI 54 year old former smoker (30 pack years) with a history of GERD, opioid abuse in remission and on methadone, OSA 2016 not on CPAP. Referred today for evaluation of COPD.  Had PNA in early April. Persistent dyspnea following. He has been admitted for resp failure in 2016 - required BiPAP.  Was treated with levaquin and pred again 06/22/19. He is still having exertional SOB. Hears wheeze and has cough in the am - yellow thick. Several exacerbations in the last year. His exercise tolerance has declined over the last years. He was found to be hypoxemic at his last PCP OV >> was given O2 to use w exertion. Does not snore - has had witnessed apneas.   Was on Spiriva Respimat, Breo >> changed to [place]. Feels that he is benefiting from this.   daliresp > has been on this for years  ROV 11/01/19 --follow-up visit 54 year old man, former smoker, with GERD, history of opioid use in remission, OSA not on CPAP.  COPD diagnosed after pneumonia in April 2021, very severe obstruction confirmed on PFT 09/15/2019.  Has been maintained on oxygen at 2 L/min with exertion.  Currently managed on Breztri, Daliresp.  At his last visit here in July discussed possible referral for pulmonary rehab. Using albuterol 2-3x a week. He has significant exertional SOB when he tries to walk without his O2.  Alpha-1 antitrypsin genotype MM, level 174 Split-night sleep study was done on 10/27/2019, confirmed with severe obstructive sleep apnea with an AHI of 30 and associated desaturation.  He was titrated to a pressure of 10 cmH2O with 1 L/min bled in.  He used a large size [employer] full facemask DreamWear mask with heated humidity.   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 He is going to try to get disability.   ROV 12/20/19 --Clarence Murray is 54, former heavy smoker  with history of GERD, prior opioid use, OSA on CPAP - just received .  Following him for very severe obstruction and COPD.  He has associated hypoxemic respiratory failure.  We have been managing him on Breztri, Daliresp. uses albuterol 1-2x a week. Fairly good functional capacity. He is having cough - thick and hard to clear. No pred or flares.  Flu shot today.  He has sp[oken to his work about retirement - I do not believe he can work anymore and have had him out on disability. He needs another letter to refect that he should be out, until the retirement can take place. I will write him whatever he needs.   Review of Systems As per HPI      Objective:   Physical Exam Vitals:   12/20/19 0921  BP: 120/80  Pulse: (!) 108  Temp: 98.1 F (36.7 C)  TempSrc: Oral  SpO2: 95%  Weight: 227 lb 12.8 oz (103.3 kg)  Height: 6' (1.829 m)    Gen: Pleasant, chronically ill appearing, in no distress,  normal affect on o2  ENT: No lesions,  mouth clear,  oropharynx clear, no postnasal drip  Neck: No JVD, no stridor  Lungs: No use of accessory muscles, very distant, no wheeze. He has a mild wheeze on forced exp  Cardiovascular: RRR, heart sounds normal, no murmur or gallops, no peripheral edema  Musculoskeletal: No deformities, no cyanosis  or clubbing, lipoma on L mid back  Neuro: alert, awake, non focal  Skin: Warm, no lesions or rash      Assessment & Plan:  OSA (obstructive sleep apnea) Agree with starting CPAP.  Will need to follow-up in the next 30 to 60 days to discuss your usage pattern. Follow with Dr. Lamonte Sakai in 2 months or sooner if you have any problems.   COPD (chronic obstructive pulmonary disease) ([place]) Please continue Breztri 2 puffs twice a day.  Rinse and gargle after using. Keep your albuterol available use 2 puffs when you need if shortness of breath, chest tightness, wheezing. Please continue your Daliresp once daily as you have been taking it. Get your second  COVID-19 vaccine as planned Flu shot today We will document your disability today, write a letter to confirm that you cannot return to work. Agree with your plans for retirement Follow with Dr. Lamonte Sakai in 2 months or sooner if you have any problems.   Baltazar Apo, MD, PhD 12/20/2019, 9:49 AM [facility] [phone] or if no answer [phone]

## 2019-12-20 NOTE — Patient Instructions (Addendum)
Please continue Breztri 2 puffs twice a day.  Rinse and gargle after using. Keep your albuterol available use 2 puffs when you need if shortness of breath, chest tightness, wheezing. Please continue your Daliresp once daily as you have been taking it. Agree with starting CPAP.  Will need to follow-up in the next 30 to 60 days to discuss your usage pattern. Get your second COVID-19 vaccine as planned Flu shot today We will document your disability today, write a letter to confirm that you cannot return to work. Agree with your plans for retirement Follow with Dr. Lamonte Sakai in 2 months or sooner if you have any problems.

## 2019-12-20 NOTE — Assessment & Plan Note (Signed)
Agree with starting CPAP.  Will need to follow-up in the next 30 to 60 days to discuss your usage pattern. Follow with Dr. Lamonte Sakai in 2 months or sooner if you have any problems.

## 2019-12-21 ENCOUNTER — Telehealth: Payer: Self-pay | Admitting: Emergency Medicine

## 2019-12-21 NOTE — Telephone Encounter (Signed)
ATC pt's wife, there was no answer and no option to leave a message. Will try back.

## 2019-12-22 NOTE — Telephone Encounter (Signed)
Spoke with the pt's spouse  She states that pt's insurance no longer covers o2 through [employer] and needs to switch DME's for o2  She states she wants to switch pt to [place] and also order a POC for him  Please advise, thanks!

## 2019-12-22 NOTE — Telephone Encounter (Signed)
Vaughan Basta wife returning phone call. Vaughan Basta phone number is [phone].

## 2019-12-23 NOTE — Telephone Encounter (Signed)
atc pt's wife, no answer and no option for VM.  Wcb.

## 2019-12-23 NOTE — Telephone Encounter (Signed)
If he wants to change, and if he wants a POC, then we need to arrange for a repeat walking oximetry and pulsed O2 titration.

## 2019-12-24 NOTE — Telephone Encounter (Signed)
atc patient's wife x2. Letter has been mailed to address on file per office protocol.

## 2020-01-04 ENCOUNTER — Telehealth: Payer: Self-pay | Admitting: Emergency Medicine

## 2020-01-04 DIAGNOSIS — G4733 Obstructive sleep apnea (adult) (pediatric): Secondary | ICD-10-CM

## 2020-01-04 NOTE — Telephone Encounter (Signed)
Spoke with pt's wife, Clarence Murray. She is needing the pt's oxygen and CPAP order to be sent to a different DME as [employer] is not in network with their insurance. Order has been placed to have this changed. Nothing further was needed.

## 2020-01-04 NOTE — Telephone Encounter (Signed)
lbpu

## 2020-01-10 ENCOUNTER — Telehealth: Payer: Self-pay | Admitting: Emergency Medicine

## 2020-01-10 NOTE — Telephone Encounter (Signed)
Rec'd confirmation from [facility] with [employer] that they did get the referral order and records

## 2020-01-10 NOTE — Telephone Encounter (Signed)
I have spoken with Mrs. Dysert and she is aware the order for Cpap and 1 liter of 02 has been received by [employer]

## 2020-01-10 NOTE — Telephone Encounter (Signed)
Records have been faxed to [employer] per the wife's request

## 2020-01-18 ENCOUNTER — Telehealth: Payer: Self-pay | Admitting: Emergency Medicine

## 2020-01-18 NOTE — Telephone Encounter (Signed)
I just spoke with Pam with [employer]. She stated that they have the order for Cpap but didn't realize they were changing 02 also. Pam also stated that they don't have any Res med Cpap machines they only have I-Breathe and they have no motem.  She is going to call Mrs. Shanahan about the patients needs. Pam thought that [employer] took all insurance plans.

## 2020-01-25 NOTE — Telephone Encounter (Signed)
Yes - thank you

## 2020-01-25 NOTE — Telephone Encounter (Signed)
I called Clarence Murray today asking if they finally got everything they needed. She stated yes she thought so but the tank that [employer] brought out for Clarence Murray to use when walking is too heavy.  Clarence Murray stated she talked with someone from [facility] yesterday and that the smaller POC are available and they would need a RX for the POC. Clarence Murray didn't know the name of the POC but small POC makes own 02, lightweight, and easy to carry. They need an order for POC placed

## 2020-01-25 NOTE — Telephone Encounter (Signed)
RB - are you okay with this order being placed? Thanks!

## 2020-01-26 NOTE — Telephone Encounter (Signed)
Pt doesn't have any documented ambulatory sats and this will be the first POC the pt has. Pt will need to come into office to have a walk to assess if a POC will work and what liter flow is best.   lmtcb for pt. Pt needs to be scheduled for a walk test to be walked on the POC.

## 2020-02-07 NOTE — Telephone Encounter (Signed)
Called and spoke with patient who states that she dropped of paperwork this morning and will need to pick it up when completed. Will forward to [place].  Asked patient about CPAP and what questions he had. He said that he was good and that he got it to work.

## 2020-02-07 NOTE — Telephone Encounter (Signed)
Pt came into the office this morning to drop off a disability retirement application for hourly employees of [employer] - I have placed the paper in Dr. Agustina Caroli box up front. He would also like a phone call to discuss questions he has about his CPAP not working and his upcoming appt -   Per pt - he has already filled out the required paperwork and paid his $29 fee - I didn't have a chance to look as I had a line and pt seemed to be in a rush -

## 2020-02-08 NOTE — Telephone Encounter (Signed)
Patient did not complete paperwork or pay $29 fee. I will try to locate forms. -pr

## 2020-02-08 NOTE — Telephone Encounter (Signed)
Form is technically not a disability or fmla form - will not collect $29 fee for this- will prepare for Dr. Agustina Caroli signature. Dr. Lamonte Sakai will be back in the office on 02/14/2020-pr

## 2020-02-11 ENCOUNTER — Other Ambulatory Visit: Payer: Self-pay

## 2020-02-11 ENCOUNTER — Ambulatory Visit: Payer: 59

## 2020-02-11 DIAGNOSIS — J9611 Chronic respiratory failure with hypoxia: Secondary | ICD-10-CM

## 2020-02-11 DIAGNOSIS — J449 Chronic obstructive pulmonary disease, unspecified: Secondary | ICD-10-CM

## 2020-02-11 NOTE — Progress Notes (Addendum)
Patient came in for walk to be qualified for a POC. Normally wears 2 liters oxygen at all times. Patient qualified for 5 liters pulsed. Will forward results to Dr. Lamonte Sakai to see if she would like order placed for POC. Nothing further needed at this time.    ADDENDUM: Reviewed results. Will order POC 5L/min pulsed flow O2  Baltazar Apo, MD, PhD 02/18/2020, 3:59 PM [facility] [phone] or if no answer [phone]

## 2020-02-16 NOTE — Telephone Encounter (Signed)
Rec'd signed form - called and informed patient it is ready for pick up- placed up front in the filing cabinet -pr

## 2020-02-18 NOTE — Addendum Note (Signed)
Addended by: Collene Gobble on: 02/18/2020 03:59 PM   Modules accepted: Orders

## 2020-03-29 ENCOUNTER — Ambulatory Visit: Payer: 59 | Admitting: Emergency Medicine

## 2021-02-28 ENCOUNTER — Telehealth: Payer: Self-pay | Admitting: Emergency Medicine

## 2021-02-28 NOTE — Telephone Encounter (Signed)
Patient has been scheduled for OV. Nothing further needed at this time. Next Appt With Pulmonology Collene Gobble, MD) 03/30/2021 at 11:45 AM

## 2021-02-28 NOTE — Telephone Encounter (Signed)
ATC x1, no answer, no VM set up.  He has not been seen since 12/20/2019, he will need an OV.

## 2021-03-30 ENCOUNTER — Ambulatory Visit: Payer: 59 | Admitting: Emergency Medicine

## 2021-04-24 ENCOUNTER — Other Ambulatory Visit: Payer: Self-pay

## 2021-04-24 ENCOUNTER — Encounter: Payer: Self-pay | Admitting: Emergency Medicine

## 2021-04-24 ENCOUNTER — Ambulatory Visit (INDEPENDENT_AMBULATORY_CARE_PROVIDER_SITE_OTHER): Payer: 59 | Admitting: Emergency Medicine

## 2021-04-24 DIAGNOSIS — J9611 Chronic respiratory failure with hypoxia: Secondary | ICD-10-CM | POA: Diagnosis not present

## 2021-04-24 DIAGNOSIS — G4733 Obstructive sleep apnea (adult) (pediatric): Secondary | ICD-10-CM | POA: Diagnosis not present

## 2021-04-24 DIAGNOSIS — J449 Chronic obstructive pulmonary disease, unspecified: Secondary | ICD-10-CM | POA: Diagnosis not present

## 2021-04-24 NOTE — Assessment & Plan Note (Signed)
We will try again to get you a portable oxygen concentrator (POC).  We will repeat your walking oximetry today and resend the request to your medical equipment company. Stay on 3 L/min at all times.

## 2021-04-24 NOTE — Patient Instructions (Addendum)
Please continue Breztri 2 puffs twice a day.  Rinse and gargle after using. Keep albuterol available to use 2 puffs up to every 4 hours if needed for shortness of breath, chest tightness, wheezing.  Continue Daliresp once daily. We will try again to get you a portable oxygen concentrator (POC).  We will repeat your walking oximetry today and resend the request to your medical equipment company. Stay on 3 L/min at all times. Follow with Dr Lamonte Sakai in 6 months or sooner if you have any problems

## 2021-04-24 NOTE — Progress Notes (Signed)
° °  Subjective:    Patient ID: Clarence Murray, male    DOB: 1966-02-27, 56 y.o.   MRN: [id number]  HPI  ROV 04/24/21 --56 year old gentleman with a history of severe obstruction and COPD.  Former smoker 30 pack years.  He has GERD, opioid abuse in remission, OSA not on CPAP.  I last saw him 12/20/2019.  At his last visit I tried to start him on CPAP but he cannot tolerate. Tried to get him a POC, but it was on backorder and he never got it He is using Breztri, albuterol as needed.  He is on Daliresp He was recently treated for bronchitis after a URI. Better after a z-pack. He is active, does household chores.  Wears his O2 at all times on 3L/min.  He is under some stress because his wife had a CVA. He is caring for her.    Review of Systems As per HPI      Objective:   Physical Exam Vitals:   04/24/21 1622  BP: 132/74  Pulse: 94  Temp: 98.8 F (37.1 C)  TempSrc: Oral  SpO2: 95%  Weight: 198 lb 3.2 oz (89.9 kg)  Height: 6' (1.829 m)    Gen: Pleasant, chronically ill appearing, in no distress,  normal affect on o2  ENT: No lesions,  mouth clear,  oropharynx clear, no postnasal drip  Neck: No JVD, no stridor  Lungs: No use of accessory muscles, very distant, no wheeze. He has a mild wheeze on forced exp  Cardiovascular: RRR, heart sounds normal, no murmur or gallops, no peripheral edema  Musculoskeletal: No deformities, no cyanosis or clubbing, lipoma on L mid back  Neuro: alert, awake, non focal  Skin: Warm, no lesions or rash      Assessment & Plan:  OSA (obstructive sleep apnea) He was unable to tolerate CPAP  COPD (chronic obstructive pulmonary disease) ([facility]) Please continue Breztri 2 puffs twice a day.  Rinse and gargle after using. Keep albuterol available to use 2 puffs up to every 4 hours if needed for shortness of breath, chest tightness, wheezing.  Continue Daliresp once daily. Follow with Dr Lamonte Sakai in 6 months or sooner if you have any  problems  Chronic respiratory failure with hypoxia [facility]) We will try again to get you a portable oxygen concentrator (POC).  We will repeat your walking oximetry today and resend the request to your medical equipment company. Stay on 3 L/min at all times.  Baltazar Apo, MD, PhD 04/24/2021, 4:53 PM [facility] [phone] or if no answer [phone]

## 2021-04-24 NOTE — Assessment & Plan Note (Signed)
Please continue Breztri 2 puffs twice a day.  Rinse and gargle after using. Keep albuterol available to use 2 puffs up to every 4 hours if needed for shortness of breath, chest tightness, wheezing.  Continue Daliresp once daily. Follow with Dr Lamonte Sakai in 6 months or sooner if you have any problems

## 2021-04-24 NOTE — Assessment & Plan Note (Signed)
He was unable to tolerate CPAP

## 2021-04-25 NOTE — Addendum Note (Signed)
Addended by: Gavin Potters R on: 04/25/2021 10:01 AM   Modules accepted: Orders

## 2021-05-29 ENCOUNTER — Telehealth: Payer: Self-pay | Admitting: Emergency Medicine

## 2021-05-29 NOTE — Telephone Encounter (Signed)
Patient's wife, Vaughan Basta, states they have not heard anything about getting a portable oxygen concentrator. When patient goes out and comes home, he is out of oxygen and having problem breathing.  ? ?Please call Vaughan Basta back, [phone]. ?

## 2021-05-29 NOTE — Telephone Encounter (Signed)
Spoke to [place]. She stated that patient is scheduled for POC eval on 06/16/2021. ?Will close encounter as nothing is further needed.  ? ?

## 2021-05-29 NOTE — Telephone Encounter (Signed)
Spoke to patient's spouse, Linda(DPR). ?Clarence Murray is requesting update on POC. She stated that patient is running out of oxygen with tanks.  ?Order was placed to [employer] on 04/25/21 for POC. ?Clarence Murray stated that she would call [employer] for update on order.  ? ?Spoke to [place] with [employer]. She is going to follow up on this.  ?

## 2021-08-07 ENCOUNTER — Telehealth: Payer: Self-pay | Admitting: Emergency Medicine

## 2021-08-07 ENCOUNTER — Ambulatory Visit: Payer: 59 | Admitting: Emergency Medicine

## 2021-08-07 NOTE — Telephone Encounter (Signed)
ATC Linda back and line rang once with message "vm box not set up yet"

## 2021-08-08 MED ORDER — DM-GUAIFENESIN ER 30-600 MG PO TB12: 1.0000 | ORAL_TABLET | Freq: Two times a day (BID) | ORAL | 0 refills | Status: AC

## 2021-08-08 NOTE — Telephone Encounter (Signed)
Please have him start guaifenesin (generic Mucinex) 600 mg twice a day.  He needs to drink lots of water with this to help loosen his mucus and allow him to cough it up effectively.  He needs to call us back by next week if he is not improving.  He also needs to call us if he is worsening in any way so we can decide if any other medications would be helpful.

## 2021-08-08 NOTE — Telephone Encounter (Signed)
ATC Linda back, no answer, no VM set up. Called [phone] and spoke with patient.  Primary Pulmonologist: Byrum Last office visit and with whom: Byrum 04/24/2021 What do we see them for (pulmonary problems): OSA, COPD, Chronic Respiratory Failure Last OV assessment/plan:  Assessment & Plan Note by Collene Gobble, MD at 04/24/2021 4:52 PM  Author: Collene Gobble, MD Author Type: Physician Filed: 04/24/2021  4:52 PM  Note Status: Written Cosign: Cosign Not Required Encounter Date: 04/24/2021  Problem: COPD (chronic obstructive pulmonary disease) [facility])  Editor: Collene Gobble, MD (Physician)               Please continue Breztri 2 puffs twice a day.  Rinse and gargle after using. Keep albuterol available to use 2 puffs up to every 4 hours if needed for shortness of breath, chest tightness, wheezing.  Continue Daliresp once daily. Follow with Dr Lamonte Sakai in 6 months or sooner if you have any problems        Assessment & Plan Note by Collene Gobble, MD at 04/24/2021 4:52 PM  Author: Collene Gobble, MD Author Type: Physician Filed: 04/24/2021  4:52 PM  Note Status: Written Cosign: Cosign Not Required Encounter Date: 04/24/2021  Problem: OSA (obstructive sleep apnea)  Editor: Collene Gobble, MD (Physician)               He was unable to tolerate CPAP        Patient Instructions by Collene Gobble, MD at 04/24/2021 4:30 PM  Author: Collene Gobble, MD Author Type: Physician Filed: 04/24/2021  4:52 PM  Note Status: Addendum Mickle Mallory: Cosign Not Required Encounter Date: 04/24/2021  Editor: Collene Gobble, MD (Physician)      Prior Versions: 1. Collene Gobble, MD (Physician) at 04/24/2021  4:51 PM - Signed    Please continue Breztri 2 puffs twice a day.  Rinse and gargle after using. Keep albuterol available to use 2 puffs up to every 4 hours if needed for shortness of breath, chest tightness, wheezing.  Continue Daliresp once daily. We will try again to get you a portable oxygen  concentrator (POC).  We will repeat your walking oximetry today and resend the request to your medical equipment company. Stay on 3 L/min at all times. Follow with Dr Lamonte Sakai in 6 months or sooner if you have any problems         Orthostatic Vitals Recorded in This Encounter   04/24/2021  1622     Patient Position: Sitting  BP Location: Left Arm  Cuff Size: Normal   Instructions  Please continue Breztri 2 puffs twice a day.  Rinse and gargle after using. Keep albuterol available to use 2 puffs up to every 4 hours if needed for shortness of breath, chest tightness, wheezing.  Continue Daliresp once daily. We will try again to get you a portable oxygen concentrator (POC).  We will repeat your walking oximetry today and resend the request to your medical equipment company. Stay on 3 L/min at all times. Follow with Dr Lamonte Sakai in 6 months or sooner if you have any problems      Was appointment offered to patient (explain)?  Rescheduled appointment for 7/13   Reason for call: Cancelled his appointment because it was too hot and he can't breathe when it is too hot.  He rescheduled his appointment for 7/13.  He says for the past week he has been having a hard time coughing up the thick yellow  mucous.  He is using his Breztri, albuterol and duoneb and Daliresp as prescribed.  He gets sob if he goes outside and exerts himself. He is on 3L of oxygen.  He was unable to get the POC, he went to the DME company and they had him walk on one and they told him he did not recover quick enough to qualify for one. Dr. Lamonte Sakai, please advise.  Thank you.  (examples of things to ask: : When did symptoms start? Fever? Cough? Productive? Color to sputum? More sputum than usual? Wheezing? Have you needed increased oxygen? Are you taking your respiratory medications? What over the counter measures have you tried?)  No Known Allergies  Immunization History  Administered Date(s) Administered   Influenza,inj,Quad  PF,6+ Mos 12/04/2017, 12/20/2019   Influenza,inj,quad, With Preservative 12/29/2020   Influenza-Unspecified 12/22/2018   PFIZER(Purple Top)SARS-COV-2 Vaccination 09/23/2019   Pneumococcal Conjugate-13 12/06/2013, 12/02/2014

## 2021-08-08 NOTE — Telephone Encounter (Signed)
Called patient to inform him of the update from Dr Lamonte Sakai. Nothing further needed

## 2021-09-14 ENCOUNTER — Encounter: Payer: Self-pay | Admitting: Emergency Medicine

## 2021-09-14 ENCOUNTER — Ambulatory Visit: Payer: 59 | Admitting: Emergency Medicine

## 2021-09-14 VITALS — BP 124/82 | HR 89 | Temp 98.4°F | Ht 72.0 in | Wt 193.6 lb

## 2021-09-14 DIAGNOSIS — J9611 Chronic respiratory failure with hypoxia: Secondary | ICD-10-CM | POA: Diagnosis not present

## 2021-09-14 DIAGNOSIS — J449 Chronic obstructive pulmonary disease, unspecified: Secondary | ICD-10-CM | POA: Diagnosis not present

## 2021-09-14 DIAGNOSIS — G4733 Obstructive sleep apnea (adult) (pediatric): Secondary | ICD-10-CM

## 2021-09-14 NOTE — Patient Instructions (Signed)
We will try again to qualify you for a portable oxygen concentrator.  If necessary we will refer you to a different DME company.  Walking oximetry today to assess. Continue your Breztri 2 puffs twice a day.  Rinse and gargle after using. Keep albuterol available to use 2 puffs if needed for shortness of breath, chest tightness, wheezing. Continue Daliresp as you have been taking it. Restart guaifenesin (generic Mucinex) 600 mg once daily.  Drink plenty of water with this. We will hold off on trying to restart CPAP at this time Follow with Clarence Murray in 6 months or sooner if you have any problems

## 2021-09-14 NOTE — Progress Notes (Signed)
Subjective:    Patient ID: Clarence Murray, male    DOB: 09-Aug-1965, 56 y.o.   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  Former smoker 30 pack years.  He has GERD, opioid abuse in remission, OSA not on CPAP.  I last saw him 12/20/2019.  At his last visit I tried to start him on CPAP but he cannot tolerate. Tried to get him a POC, but it was on backorder and he never got it He is using Breztri, albuterol as needed.  He is on Daliresp He was recently treated for bronchitis after a URI. Better after a z-pack. He is active, does household chores.  Wears his O2 at all times on 3L/min.  He is under some stress because his wife had a CVA. He is caring for her.   ROV 09/14/21 --56 year old man, former smoker with very severe COPD.  Also with a history of OSA, GERD, former substance abuse.  We have tried him back on CPAP but has been unable to tolerate.  He has been managed on Breztri, Daliresp, uses albuterol approximately.  We had work to get him on a portable oxygen concentrator, titrated him to 3 L/min pulsed at his last visit but he was then told by DME that he did not recover quickly enough on POC 3 L/min to qualify for this.  I asked him to start scheduled Mucinex in early June because he was having difficulty with thicker secretions. He has now stopped it Today he reports that he is doing ok, still clearing some yellowish secretions daily - a bit difficult to clear now off the mucinex. He was on abx since I last saw him for a toe infection. No steroids. Using albuterol 2-3x a day.    Review of Systems As per HPI      Objective:   Physical Exam Vitals:   09/14/21 0949  BP: 124/82  Pulse: 89  Temp: 98.4 F (36.9 C)  TempSrc: Oral  SpO2: 91%  Weight: 193 lb 9.6 oz (87.8 kg)  Height: 6' (1.829 m)    Gen: Pleasant, chronically ill appearing, in no distress,  normal affect on o2  ENT: No lesions,  mouth clear,   oropharynx clear, no postnasal drip  Neck: No JVD, no stridor  Lungs: No use of accessory muscles, very distant, no wheeze. He has a mild wheeze on forced exp  Cardiovascular: RRR, heart sounds normal, no murmur or gallops, no peripheral edema  Musculoskeletal: No deformities, no cyanosis or clubbing, lipoma on L mid back  Neuro: alert, awake, non focal  Skin: Warm, no lesions or rash      Assessment & Plan:  COPD (chronic obstructive pulmonary disease) (HCC) Continue your Breztri 2 puffs twice a day.  Rinse and gargle after using. Keep albuterol available to use 2 puffs if needed for shortness of breath, chest tightness, wheezing. Continue Daliresp as you have been taking it. Restart guaifenesin (generic Mucinex) 600 mg once daily.  Drink plenty of water with this. Follow with Dr Lamonte Sakai in 6 months or sooner if you have any problems  OSA (obstructive sleep apnea) Hold off on restarting CPAP at this time.  He has not been able to tolerate  Chronic respiratory failure with hypoxia (HCC) We will try again to qualify you for a portable oxygen concentrator.  If necessary we will refer you to a different DME company.  Walking oximetry today to assess.  Baltazar Apo, MD, PhD 09/14/2021, 10:08 AM [facility] [phone] or if no answer [phone]

## 2021-09-14 NOTE — Assessment & Plan Note (Signed)
Continue your Breztri 2 puffs twice a day.  Rinse and gargle after using. Keep albuterol available to use 2 puffs if needed for shortness of breath, chest tightness, wheezing. Continue Daliresp as you have been taking it. Restart guaifenesin (generic Mucinex) 600 mg once daily.  Drink plenty of water with this. Follow with Dr Lamonte Sakai in 6 months or sooner if you have any problems

## 2021-09-14 NOTE — Assessment & Plan Note (Signed)
We will try again to qualify you for a portable oxygen concentrator.  If necessary we will refer you to a different DME company.  Walking oximetry today to assess.

## 2021-09-14 NOTE — Assessment & Plan Note (Signed)
Hold off on restarting CPAP at this time.  He has not been able to tolerate

## 2021-12-11 IMAGING — DX DG CHEST 2V
2 series · 2 of 2 positions shown · non-contrast
Comparison: CT chest dated 03/16/2014

CLINICAL DATA: Shortness of breath

EXAM:
CHEST - 2 VIEW

[chest pa]
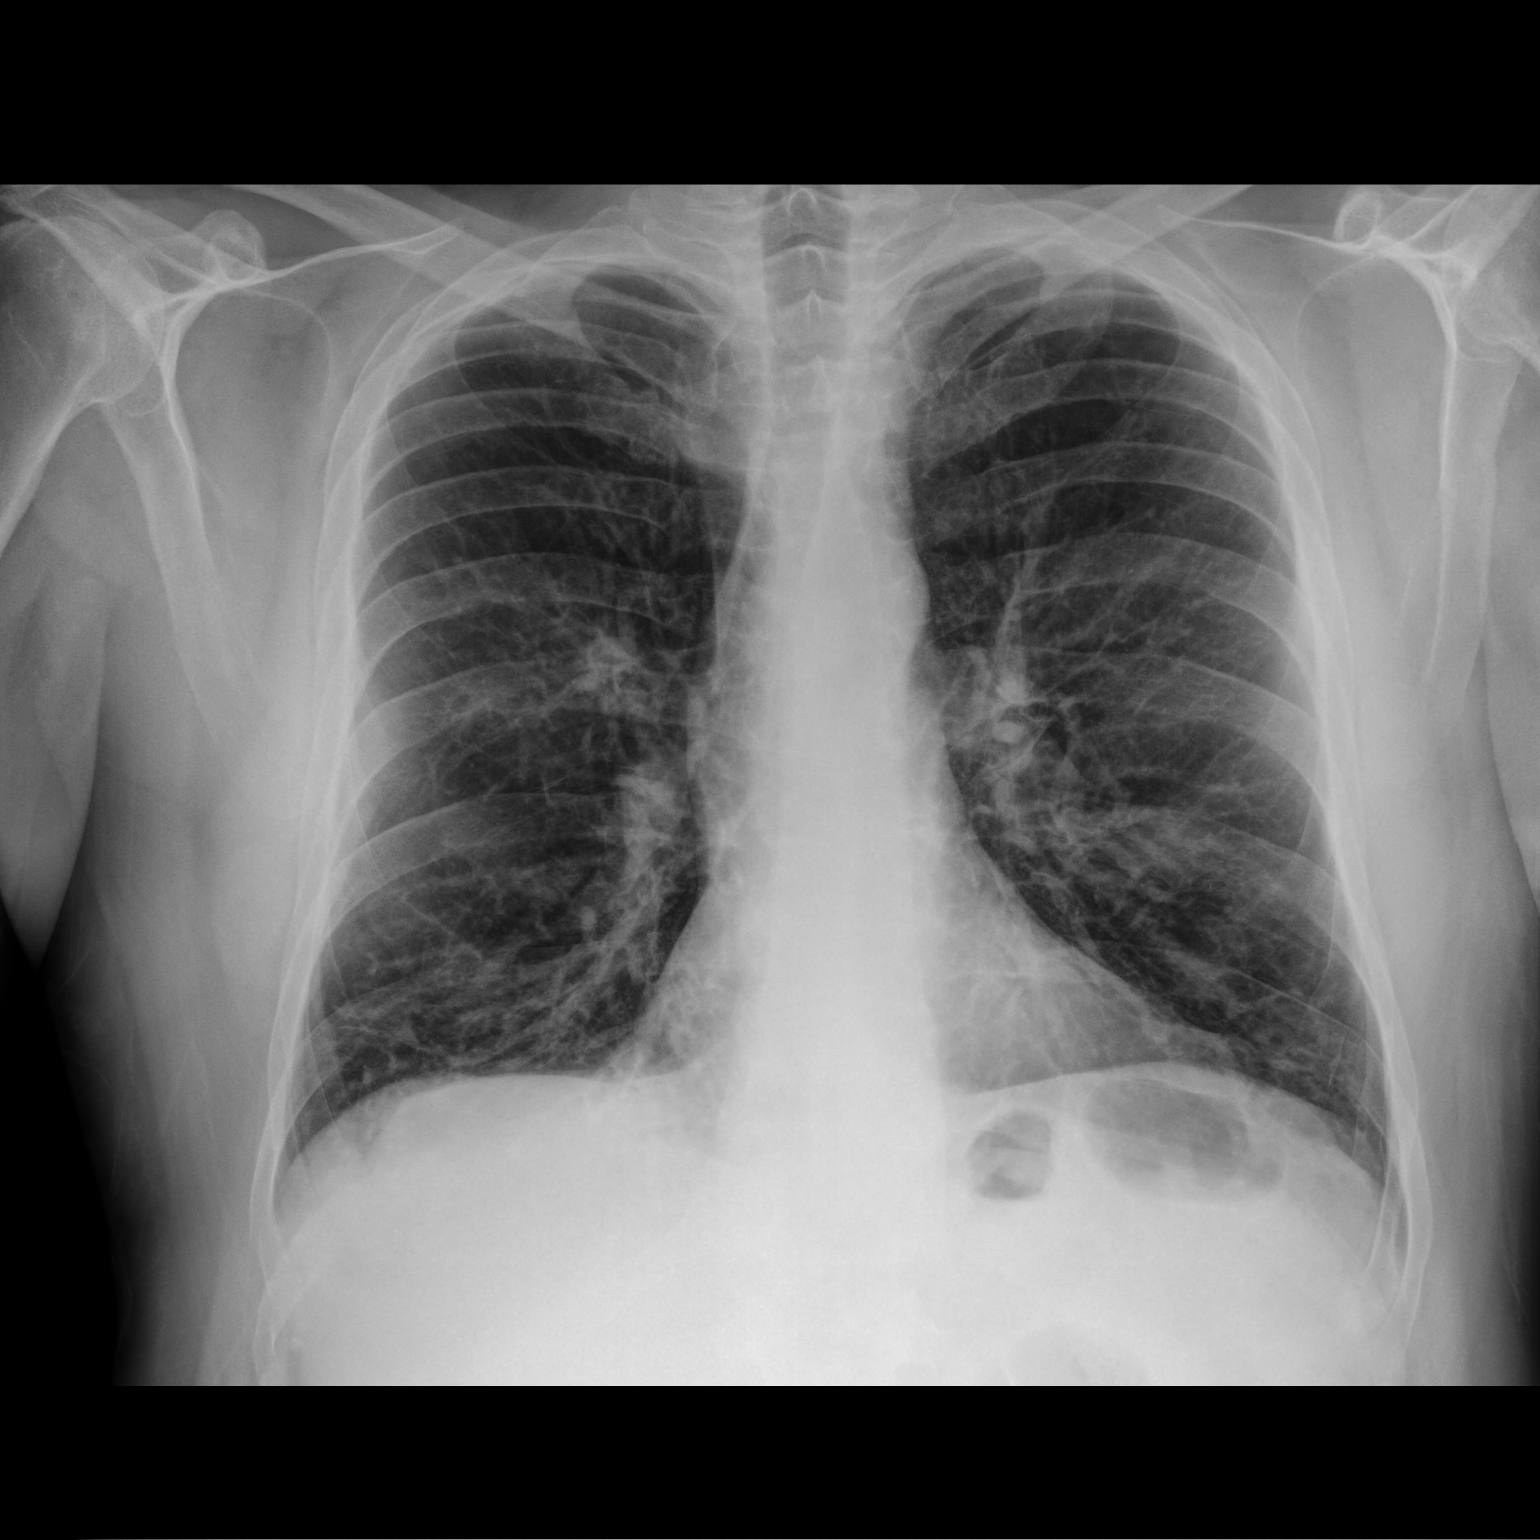

[chest lat]
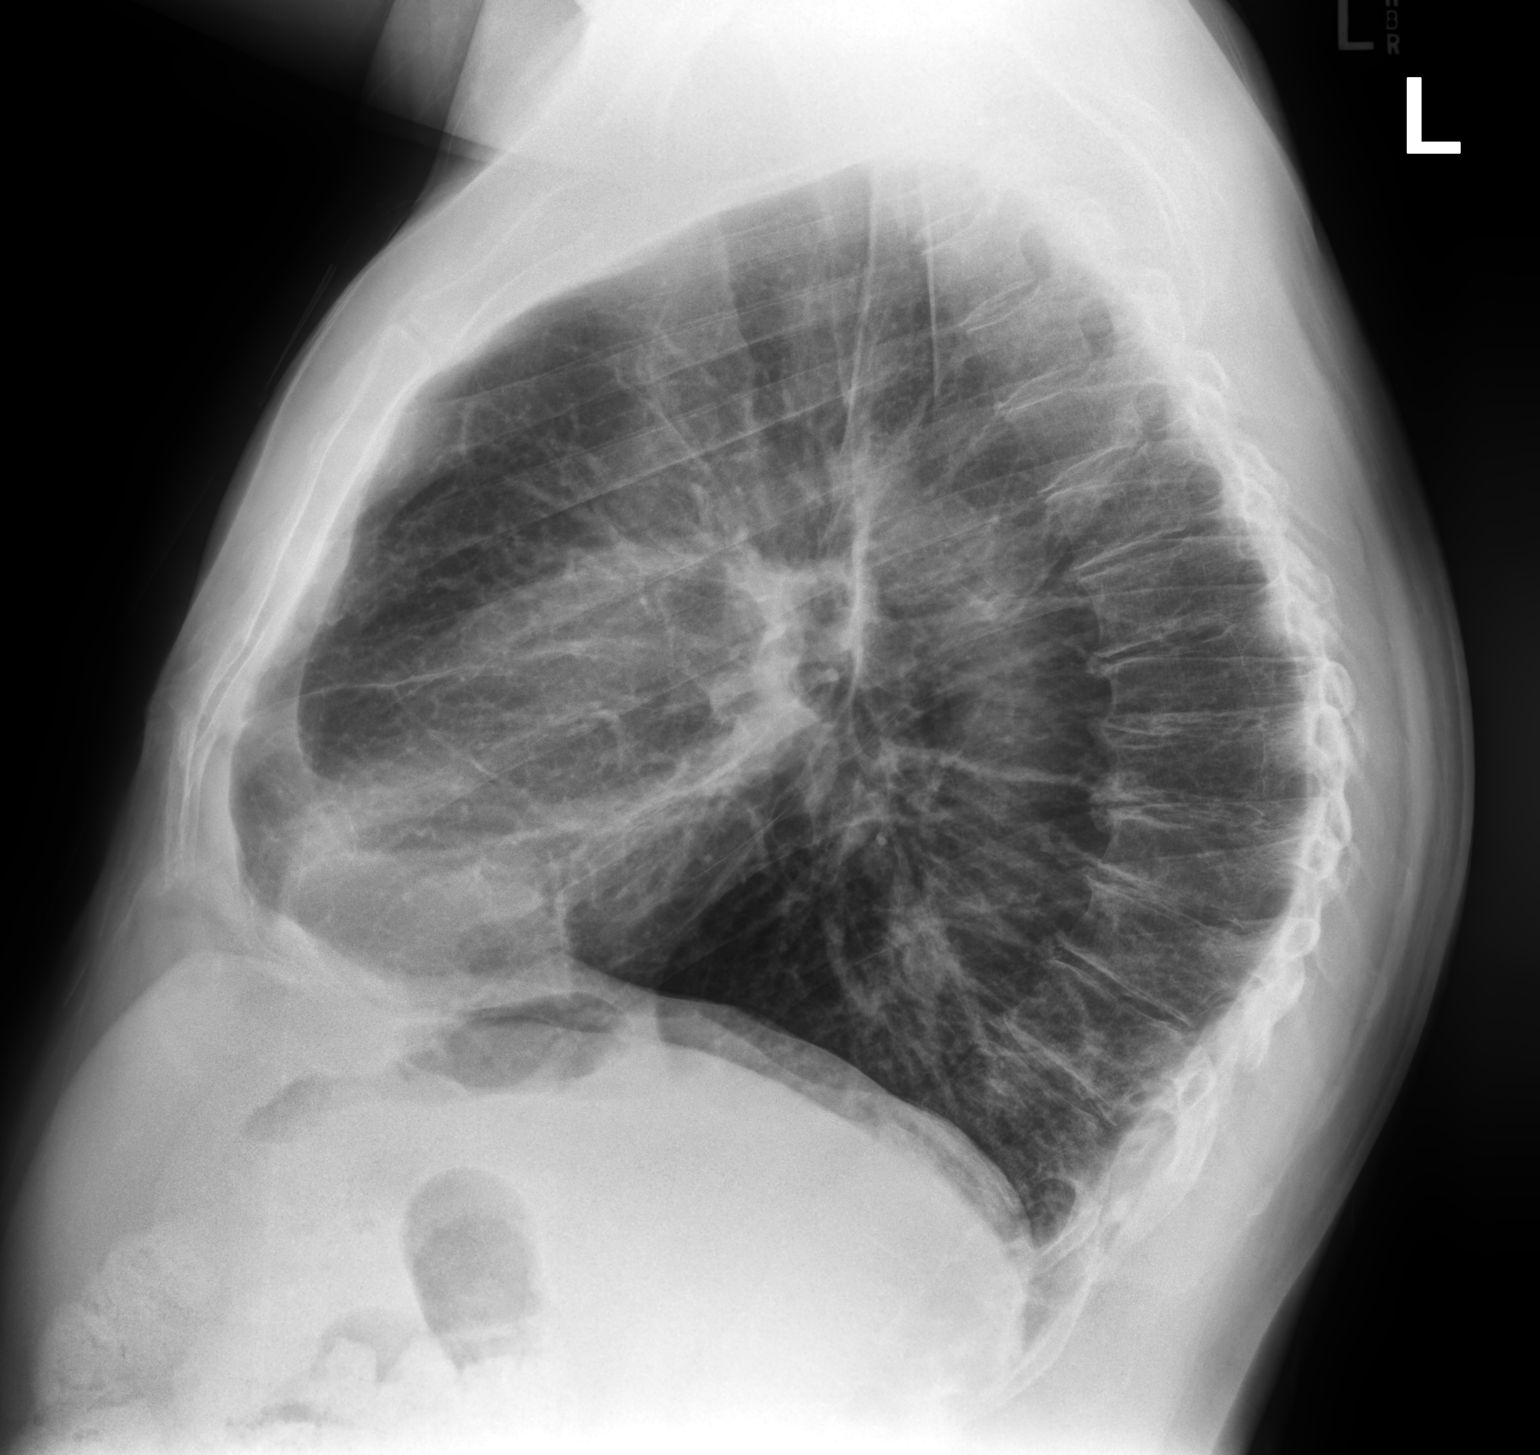

[2 of 2 positions shown; findings below may reference images not displayed]

FINDINGS: Lungs are essentially clear. Mild increased perihilar markings
without focal consolidation. No pleural effusion or pneumothorax.

The heart is normal in size.

Degenerative changes of the visualized thoracolumbar spine.
IMPRESSION: Normal chest radiographs.

## 2022-03-28 ENCOUNTER — Telehealth: Payer: Self-pay | Admitting: Emergency Medicine

## 2022-03-28 NOTE — Telephone Encounter (Signed)
Attempted to call pt's spouse but the line went directly to VM. Received a message that voicemail had not been set up yet. Will try to call back later.

## 2022-03-29 ENCOUNTER — Telehealth: Payer: Self-pay | Admitting: Emergency Medicine

## 2022-03-29 NOTE — Telephone Encounter (Signed)
ATC but line went straight to voicemail and it states no voicemail is set up at the moment. Will try again

## 2022-03-29 NOTE — Telephone Encounter (Signed)
See last encounter (signed) Pt ret missed calls. Pls call wife [phone]

## 2022-03-29 NOTE — Telephone Encounter (Signed)
Attempted to call both pt and spouse but unable to reach. Left message for them to return call.  Due to multiple attempts trying to reach and unable to do so, per protocol encounter will be closed.

## 2022-04-03 NOTE — Telephone Encounter (Signed)
ATC LVMTCB x 1 ?

## 2022-04-04 ENCOUNTER — Ambulatory Visit: Payer: 59 | Admitting: Nurse Practitioner

## 2022-04-05 MED ORDER — AZITHROMYCIN 250 MG PO TABS: ORAL_TABLET | ORAL | 0 refills | Status: DC

## 2022-04-05 MED ORDER — ROFLUMILAST 250 MCG PO TABS: 250.0000 ug | ORAL_TABLET | Freq: Every day | ORAL | 0 refills | Status: AC

## 2022-04-05 MED ORDER — PREDNISONE 10 MG PO TABS: 40.0000 mg | ORAL_TABLET | Freq: Every day | ORAL | 0 refills | Status: DC

## 2022-04-05 NOTE — Telephone Encounter (Signed)
Spoke with Vaughan Basta [facility]) who states pt is have increased productive cough and SOB. Vaughan Basta states pt denies fever/ chills/ GI upset. According to Vaughan Basta pt has been using Breztri and [employer] as directed. Pt is scheduled for OV with Dr. Lamonte Sakai on 04/30/22. Joellen Jersey will please advise as Dr. Lamonte Sakai is currently unavailable

## 2022-04-05 NOTE — Telephone Encounter (Signed)
Spoke with Vaughan Basta [facility]) and reviewed Katie's recommendations along with medication instructions. Vaughan Basta stated understanding and requested Daliresp refill which was sent in along with Z-Pack and Prednisone orders to verified pharmacy. Nothing further needed at this time.

## 2022-04-05 NOTE — Telephone Encounter (Signed)
Possible AECOPD. Needs to be using mucinex [phone] mg Twice daily for cough/congestion. Use nebs 2-4 times a day until symptoms improve. Ok to send z pack - take as directed. Prednisone 40 mg daily for 5 days. Take in AM with food. If symptoms worsen or unable to talk in complete sentences, needs to go to ED for further evaluation. Keep appt with Dr. Lamonte Sakai as scheduled or if symptoms do not improve, needs sooner f/u. Thanks.

## 2022-04-30 ENCOUNTER — Ambulatory Visit: Payer: 59 | Admitting: Emergency Medicine

## 2022-10-16 ENCOUNTER — Encounter: Payer: Self-pay | Admitting: Emergency Medicine

## 2022-10-16 ENCOUNTER — Ambulatory Visit: Payer: 59 | Admitting: Emergency Medicine

## 2022-10-16 VITALS — BP 132/78 | HR 92 | Resp 18 | Ht 72.0 in | Wt 188.6 lb

## 2022-10-16 DIAGNOSIS — J9611 Chronic respiratory failure with hypoxia: Secondary | ICD-10-CM

## 2022-10-16 DIAGNOSIS — J449 Chronic obstructive pulmonary disease, unspecified: Secondary | ICD-10-CM

## 2022-10-16 MED ORDER — IPRATROPIUM-ALBUTEROL 0.5-2.5 (3) MG/3ML IN SOLN: 3.0000 mL | Freq: Four times a day (QID) | RESPIRATORY_TRACT | 5 refills | Status: AC | PRN

## 2022-10-16 NOTE — Assessment & Plan Note (Signed)
Wear your oxygen reliably 2-4 L/min depending on your level of exertion

## 2022-10-16 NOTE — Patient Instructions (Signed)
Please continue Breztri 2 puffs twice a day.  Rinse and gargle after using. Continue to use your DuoNeb as needed for shortness of breath.  We will replace your nebulizer machine and reorder this medication for you. Keep albuterol inhaler available to use 2 puffs if needed for shortness of breath, chest tightness, wheezing. Continue Daliresp once daily Wear your oxygen reliably 2-4 L/min depending on your level of exertion Get your flu shot and COVID-19 vaccine this fall Call us if you develop any flaring breathing symptoms so we can address. Follow with APP in 6 months Follow Dr. Delton Coombes in 1 year or sooner if you have any problems.

## 2022-10-16 NOTE — Assessment & Plan Note (Signed)
Please continue Breztri 2 puffs twice a day.  Rinse and gargle after using. Continue to use your DuoNeb as needed for shortness of breath.  We will replace your nebulizer machine and reorder this medication for you. Keep albuterol inhaler available to use 2 puffs if needed for shortness of breath, chest tightness, wheezing. Continue Daliresp once daily Get your flu shot and COVID-19 vaccine this fall Call us if you develop any flaring breathing symptoms so we can address. Follow with APP in 6 months Follow Dr. Delton Coombes in 1 year or sooner if you have any problems.

## 2022-10-16 NOTE — Progress Notes (Signed)
Subjective:    Patient ID: Clarence Murray, male    DOB: Sep 05, 1965, 57 y.o.   MRN: [id number]  HPI  ROV 04/24/21 --57 year old gentleman with a history of severe obstruction and COPD.  Former smoker 30 pack years.  He has GERD, opioid abuse in remission, OSA not on CPAP.  I last saw him 12/20/2019.  At his last visit I tried to start him on CPAP but he cannot tolerate. Tried to get him a POC, but it was on backorder and he never got it He is using Breztri, albuterol as needed.  He is on Daliresp He was recently treated for bronchitis after a URI. Better after a z-pack. He is active, does household chores.  Wears his O2 at all times on 3L/min.  He is under some stress because his wife had a CVA. He is caring for her.   ROV 09/14/21 --57 year old man, former smoker with very severe COPD.  Also with a history of OSA, GERD, former substance abuse.  We have tried him back on CPAP but has been unable to tolerate.  He has been managed on Breztri, Daliresp, uses albuterol approximately.  We had work to get him on a portable oxygen concentrator, titrated him to 3 L/min pulsed at his last visit but he was then told by DME that he did not recover quickly enough on POC 3 L/min to qualify for this.  I asked him to start scheduled Mucinex in early June because he was having difficulty with thicker secretions. He has now stopped it Today he reports that he is doing ok, still clearing some yellowish secretions daily - a bit difficult to clear now off the mucinex. He was on abx since I last saw him for a toe infection. No steroids. Using albuterol 2-3x a day.   ROV 10/16/2022 -follow-up visit 57 year old gentleman, former smoker with very severe COPD.  Also with OSA (unable to tolerate CPAP), GERD, former substance abuse.Marland Kitchen  He is using oxygen at 2-4 pulsed Maintenance regimen is currently Breztri, Daliresp, DuoNeb as needed about 2x a day, albuterol HFA by as needed - uses rarely.  Uses albuterol approximately.  He  has a broken nebulizer machine, needs a replacement. Has not been to ED or UC. Had a flare 2 months ago - received steroids and abx. He does still get exertional SOB, can be hard to recover after heavier exertion. Daily cough, white mucous.    Review of Systems As per HPI      Objective:   Physical Exam Vitals:   10/16/22 0949  BP: 132/78  Pulse: 92  Resp: 18  SpO2: 92%  Weight: 188 lb 9.6 oz (85.5 kg)  Height: 6' (1.829 m)    Gen: Pleasant, chronically ill appearing, in no distress,  normal affect on o2  ENT: No lesions,  mouth clear,  oropharynx clear, no postnasal drip  Neck: No JVD, no stridor  Lungs: No use of accessory muscles, very distant, no wheeze.  Clear even on forced expiration  Cardiovascular: RRR, heart sounds normal, no murmur or gallops, no peripheral edema  Musculoskeletal: No deformities, no cyanosis or clubbing, lipoma on L mid back  Neuro: alert, awake, non focal  Skin: Warm, no lesions or rash      Assessment & Plan:  COPD (chronic obstructive pulmonary disease) (HCC) Please continue Breztri 2 puffs twice a day.  Rinse and gargle after using. Continue to use your DuoNeb as needed for shortness of breath.  We will replace  your nebulizer machine and reorder this medication for you. Keep albuterol inhaler available to use 2 puffs if needed for shortness of breath, chest tightness, wheezing. Continue Daliresp once daily Get your flu shot and COVID-19 vaccine this fall Call us if you develop any flaring breathing symptoms so we can address. Follow with APP in 6 months Follow Dr. Delton Coombes in 1 year or sooner if you have any problems.  Chronic respiratory failure with hypoxia (HCC) Wear your oxygen reliably 2-4 L/min depending on your level of exertion   Levy Pupa, MD, PhD 10/16/2022, 10:17 AM [facility] [phone] or if no answer [phone]

## 2023-04-17 ENCOUNTER — Telehealth: Payer: Self-pay

## 2023-04-17 NOTE — Telephone Encounter (Signed)
I called and spoke to pts wife (DPR) to verify if the pt is using anything to treat his osa, since I was unable to find a dl. Pt wife states he sleeps with oxygen at night.

## 2023-04-18 ENCOUNTER — Ambulatory Visit: Payer: 59 | Admitting: Primary Care

## 2023-05-05 ENCOUNTER — Inpatient Hospital Stay (HOSPITAL_COMMUNITY)
Admission: EM | Admit: 2023-05-05 | Discharge: 2023-05-11 | DRG: 190 | Disposition: A | Discharge status: Home / Self Care | Attending: Internal Medicine | Admitting: Internal Medicine

## 2023-05-05 ENCOUNTER — Emergency Department (HOSPITAL_COMMUNITY)

## 2023-05-05 ENCOUNTER — Other Ambulatory Visit: Payer: Self-pay

## 2023-05-05 DIAGNOSIS — G8929 Other chronic pain: Secondary | ICD-10-CM | POA: Diagnosis present

## 2023-05-05 DIAGNOSIS — Z9981 Dependence on supplemental oxygen: Secondary | ICD-10-CM

## 2023-05-05 DIAGNOSIS — J9621 Acute and chronic respiratory failure with hypoxia: Secondary | ICD-10-CM | POA: Diagnosis present

## 2023-05-05 DIAGNOSIS — Z79891 Long term (current) use of opiate analgesic: Secondary | ICD-10-CM

## 2023-05-05 DIAGNOSIS — E871 Hypo-osmolality and hyponatremia: Secondary | ICD-10-CM | POA: Diagnosis not present

## 2023-05-05 DIAGNOSIS — J441 Chronic obstructive pulmonary disease with (acute) exacerbation: Principal | ICD-10-CM | POA: Diagnosis present

## 2023-05-05 DIAGNOSIS — J9601 Acute respiratory failure with hypoxia: Principal | ICD-10-CM

## 2023-05-05 DIAGNOSIS — I2510 Atherosclerotic heart disease of native coronary artery without angina pectoris: Secondary | ICD-10-CM | POA: Diagnosis present

## 2023-05-05 DIAGNOSIS — K219 Gastro-esophageal reflux disease without esophagitis: Secondary | ICD-10-CM | POA: Diagnosis present

## 2023-05-05 DIAGNOSIS — E785 Hyperlipidemia, unspecified: Secondary | ICD-10-CM | POA: Diagnosis present

## 2023-05-05 DIAGNOSIS — Z825 Family history of asthma and other chronic lower respiratory diseases: Secondary | ICD-10-CM

## 2023-05-05 DIAGNOSIS — Z79899 Other long term (current) drug therapy: Secondary | ICD-10-CM

## 2023-05-05 DIAGNOSIS — F419 Anxiety disorder, unspecified: Secondary | ICD-10-CM | POA: Diagnosis present

## 2023-05-05 DIAGNOSIS — J219 Acute bronchiolitis, unspecified: Secondary | ICD-10-CM | POA: Diagnosis present

## 2023-05-05 DIAGNOSIS — Z87891 Personal history of nicotine dependence: Secondary | ICD-10-CM

## 2023-05-05 DIAGNOSIS — Z1152 Encounter for screening for COVID-19: Secondary | ICD-10-CM

## 2023-05-05 DIAGNOSIS — F1110 Opioid abuse, uncomplicated: Secondary | ICD-10-CM | POA: Diagnosis present

## 2023-05-05 DIAGNOSIS — Z8249 Family history of ischemic heart disease and other diseases of the circulatory system: Secondary | ICD-10-CM

## 2023-05-05 DIAGNOSIS — G4733 Obstructive sleep apnea (adult) (pediatric): Secondary | ICD-10-CM | POA: Diagnosis present

## 2023-05-05 DIAGNOSIS — J9612 Chronic respiratory failure with hypercapnia: Secondary | ICD-10-CM | POA: Diagnosis present

## 2023-05-05 DIAGNOSIS — E875 Hyperkalemia: Secondary | ICD-10-CM | POA: Diagnosis present

## 2023-05-05 LAB — CBC WITH DIFFERENTIAL/PLATELET
Abs Immature Granulocytes: 0.03 10*3/uL (ref 0.00–0.07)
Basophils Absolute: 0 10*3/uL (ref 0.0–0.1)
Basophils Relative: 0 %
Eosinophils Absolute: 0 10*3/uL (ref 0.0–0.5)
Eosinophils Relative: 0 %
HCT: 40.5 % (ref 39.0–52.0)
Hemoglobin: 12.8 g/dL — ABNORMAL LOW (ref 13.0–17.0)
Immature Granulocytes: 0 %
Lymphocytes Relative: 11 %
Lymphs Abs: 1.1 10*3/uL (ref 0.7–4.0)
MCH: 30.1 pg (ref 26.0–34.0)
MCHC: 31.6 g/dL (ref 30.0–36.0)
MCV: 95.3 fL (ref 80.0–100.0)
Monocytes Absolute: 0.8 10*3/uL (ref 0.1–1.0)
Monocytes Relative: 8 %
Neutro Abs: 8.4 10*3/uL — ABNORMAL HIGH (ref 1.7–7.7)
Neutrophils Relative %: 81 %
Platelets: 254 10*3/uL (ref 150–400)
RBC: 4.25 MIL/uL (ref 4.22–5.81)
RDW: 12.7 % (ref 11.5–15.5)
WBC: 10.5 10*3/uL (ref 4.0–10.5)
nRBC: 0 % (ref 0.0–0.2)

## 2023-05-05 LAB — COMPREHENSIVE METABOLIC PANEL
ALT: 16 U/L (ref 0–44)
AST: 45 U/L — ABNORMAL HIGH (ref 15–41)
Albumin: 3.4 g/dL — ABNORMAL LOW (ref 3.5–5.0)
Alkaline Phosphatase: 74 U/L (ref 38–126)
Anion gap: 16 — ABNORMAL HIGH (ref 5–15)
BUN: 26 mg/dL — ABNORMAL HIGH (ref 6–20)
CO2: 27 mmol/L (ref 22–32)
Calcium: 8.7 mg/dL — ABNORMAL LOW (ref 8.9–10.3)
Chloride: 88 mmol/L — ABNORMAL LOW (ref 98–111)
Creatinine, Ser: 1.07 mg/dL (ref 0.61–1.24)
GFR, Estimated: >60 mL/min (ref >60)
Glucose, Bld: 109 mg/dL — ABNORMAL HIGH (ref 70–99)
Potassium: 4.1 mmol/L (ref 3.5–5.1)
Sodium: 131 mmol/L — ABNORMAL LOW (ref 135–145)
Total Bilirubin: 2 mg/dL — ABNORMAL HIGH (ref 0.0–1.2)
Total Protein: 7.8 g/dL (ref 6.5–8.1)

## 2023-05-05 LAB — I-STAT VENOUS BLOOD GAS, ED
Acid-Base Excess: 5 mmol/L — ABNORMAL HIGH (ref 0.0–2.0)
Bicarbonate: 32.2 mmol/L — ABNORMAL HIGH (ref 20.0–28.0)
Calcium, Ion: 0.99 mmol/L — ABNORMAL LOW (ref 1.15–1.40)
HCT: 41 % (ref 39.0–52.0)
Hemoglobin: 13.9 g/dL (ref 13.0–17.0)
O2 Saturation: 87 %
Potassium: 4 mmol/L (ref 3.5–5.1)
Sodium: 130 mmol/L — ABNORMAL LOW (ref 135–145)
TCO2: 34 mmol/L — ABNORMAL HIGH (ref 22–32)
pCO2, Ven: 58.3 mmHg (ref 44–60)
pH, Ven: 7.35 (ref 7.25–7.43)
pO2, Ven: 58 mmHg — ABNORMAL HIGH (ref 32–45)

## 2023-05-05 LAB — TROPONIN I (HIGH SENSITIVITY): Troponin I (High Sensitivity): 31 ng/L — ABNORMAL HIGH (ref <18)

## 2023-05-05 MED ORDER — IPRATROPIUM BROMIDE 0.02 % IN SOLN
RESPIRATORY_TRACT | Status: AC
Start: 2023-05-05 — End: 2023-05-05
  Administered 2023-05-05: 0.5 mg via RESPIRATORY_TRACT
  Filled 2023-05-05: qty 2.5

## 2023-05-05 MED ORDER — ALBUTEROL SULFATE (2.5 MG/3ML) 0.083% IN NEBU
INHALATION_SOLUTION | RESPIRATORY_TRACT | Status: AC
Start: 2023-05-05 — End: 2023-05-05
  Administered 2023-05-05: 20 mg via RESPIRATORY_TRACT
  Filled 2023-05-05: qty 24

## 2023-05-05 MED ORDER — IPRATROPIUM BROMIDE 0.02 % IN SOLN: 0.5000 mg | Freq: Once | RESPIRATORY_TRACT | Status: AC

## 2023-05-05 MED ORDER — ALBUTEROL SULFATE (2.5 MG/3ML) 0.083% IN NEBU: 20.0000 mg | INHALATION_SOLUTION | Freq: Once | RESPIRATORY_TRACT | Status: AC

## 2023-05-05 NOTE — ED Triage Notes (Signed)
 Pt BIB [facility] from home. Pt had sudden onset of SOB starting last night. Through out the day SOB worsened and started having chest tightness "like I can't take a big breath". Pt wears 3L [place] at baseline   125 Solumedrol  2g Mag 2 DuoNebs

## 2023-05-05 NOTE — ED Provider Notes (Signed)
  [place] [facility] DEPARTMENT AT [facility] Provider Note   CSN: [id number] Arrival date & time: 05/05/23  2253     History {Add pertinent medical, surgical, social history, OB history to HPI:1} No chief complaint on file.   Clarence Murray is a 58 y.o. male.  HPI     Home Medications Prior to Admission medications   Medication Sig Start Date End Date Taking? Authorizing Provider  albuterol (VENTOLIN HFA) 108 (90 Base) MCG/ACT inhaler Inhale into the lungs. 05/15/17   [provider]  BREZTRI AEROSPHERE 160-9-4.8 MCG/ACT AERO  07/28/19   [provider]  esomeprazole (NEXIUM) 40 MG capsule Take 40 mg by mouth as needed.     [provider]  ipratropium-albuterol (DUONEB) 0.5-2.5 (3) MG/3ML SOLN Inhale 3 mLs into the lungs every 6 (six) hours as needed (shortness of breath). 10/16/22   Leslye Peer, MD  methadone (DOLOPHINE) 10 MG/ML solution Take 110 mg by mouth every morning.     [provider]  Roflumilast (DALIRESP) 250 MCG TABS Take 250 mcg by mouth daily. 04/05/22   Leslye Peer, MD      Allergies    Patient has no known allergies.    Review of Systems   Review of Systems  Physical Exam Updated Vital Signs There were no vitals taken for this visit. Physical Exam  ED Results / Procedures / Treatments   Labs (all labs ordered are listed, but only abnormal results are displayed) Labs Reviewed  RESP PANEL BY RT-PCR (RSV, FLU A&B, COVID)  RVPGX2  COMPREHENSIVE METABOLIC PANEL  CBC WITH DIFFERENTIAL/PLATELET  TROPONIN I (HIGH SENSITIVITY)    EKG None  Radiology No results found.  Procedures Procedures  {Document cardiac monitor, telemetry assessment procedure when appropriate:1}  Medications Ordered in ED Medications  albuterol (PROVENTIL,VENTOLIN) solution continuous neb (has no administration in time range)  ipratropium (ATROVENT) nebulizer solution 0.5 mg (has no administration in time range)    ED  Course/ Medical Decision Making/ A&P   {   Click here for ABCD2, HEART and other calculatorsREFRESH Note before signing :1}                              Medical Decision Making Amount and/or Complexity of Data Reviewed Labs: ordered. Radiology: ordered.  Risk Prescription drug management.   ***  {Document critical care time when appropriate:1} {Document review of labs and clinical decision tools ie heart score, Chads2Vasc2 etc:1}  {Document your independent review of radiology images, and any outside records:1} {Document your discussion with family members, caretakers, and with consultants:1} {Document social determinants of health affecting pt's care:1} {Document your decision making why or why not admission, treatments were needed:1} Final Clinical Impression(s) / ED Diagnoses Final diagnoses:  None    Rx / DC Orders ED Discharge Orders     None

## 2023-05-06 ENCOUNTER — Encounter (HOSPITAL_COMMUNITY): Payer: Self-pay | Admitting: Internal Medicine

## 2023-05-06 DIAGNOSIS — J441 Chronic obstructive pulmonary disease with (acute) exacerbation: Secondary | ICD-10-CM | POA: Diagnosis present

## 2023-05-06 LAB — TROPONIN I (HIGH SENSITIVITY): Troponin I (High Sensitivity): 27 ng/L — ABNORMAL HIGH (ref <18)

## 2023-05-06 LAB — URINALYSIS, ROUTINE W REFLEX MICROSCOPIC
Bilirubin Urine: NEGATIVE
Glucose, UA: >=500 mg/dL — AB
Ketones, ur: 20 mg/dL — AB
Leukocytes,Ua: NEGATIVE
Nitrite: NEGATIVE
Protein, ur: NEGATIVE mg/dL
Specific Gravity, Urine: 1.025 (ref 1.005–1.030)
pH: 5 (ref 5.0–8.0)

## 2023-05-06 LAB — RESP PANEL BY RT-PCR (RSV, FLU A&B, COVID)  RVPGX2
Influenza A by PCR: NEGATIVE
Influenza B by PCR: NEGATIVE
Resp Syncytial Virus by PCR: NEGATIVE
SARS Coronavirus 2 by RT PCR: NEGATIVE

## 2023-05-06 MED ORDER — FLUTICASONE FUROATE-VILANTEROL 100-25 MCG/ACT IN AEPB
1.0000 | INHALATION_SPRAY | Freq: Every day | RESPIRATORY_TRACT | Status: DC
  Administered 2023-05-08 – 2023-05-11 (×3): 1 via RESPIRATORY_TRACT
  Filled 2023-05-06: qty 28

## 2023-05-06 MED ORDER — DOXYCYCLINE HYCLATE 100 MG PO TABS
100.0000 mg | ORAL_TABLET | Freq: Two times a day (BID) | ORAL | Status: AC
  Administered 2023-05-06 – 2023-05-10 (×10): 100 mg via ORAL
  Filled 2023-05-06 (×10): qty 1

## 2023-05-06 MED ORDER — ROSUVASTATIN CALCIUM 5 MG PO TABS
5.0000 mg | ORAL_TABLET | Freq: Every day | ORAL | Status: DC
  Administered 2023-05-06 – 2023-05-11 (×6): 5 mg via ORAL
  Filled 2023-05-06 (×6): qty 1

## 2023-05-06 MED ORDER — ALBUTEROL SULFATE (2.5 MG/3ML) 0.083% IN NEBU: 2.5000 mg | INHALATION_SOLUTION | Freq: Four times a day (QID) | RESPIRATORY_TRACT | Status: DC | PRN

## 2023-05-06 MED ORDER — BISACODYL 5 MG PO TBEC: 5.0000 mg | DELAYED_RELEASE_TABLET | Freq: Every day | ORAL | Status: DC | PRN

## 2023-05-06 MED ORDER — ENOXAPARIN SODIUM 40 MG/0.4ML IJ SOSY
40.0000 mg | PREFILLED_SYRINGE | INTRAMUSCULAR | Status: DC
  Administered 2023-05-06 – 2023-05-11 (×6): 40 mg via SUBCUTANEOUS
  Filled 2023-05-06 (×6): qty 0.4

## 2023-05-06 MED ORDER — ALBUTEROL SULFATE (2.5 MG/3ML) 0.083% IN NEBU
3.0000 mL | INHALATION_SOLUTION | Freq: Two times a day (BID) | RESPIRATORY_TRACT | Status: DC
  Administered 2023-05-06 – 2023-05-10 (×6): 3 mL via RESPIRATORY_TRACT
  Filled 2023-05-06 (×9): qty 3

## 2023-05-06 MED ORDER — HYDRALAZINE HCL 20 MG/ML IJ SOLN: 10.0000 mg | Freq: Four times a day (QID) | INTRAMUSCULAR | Status: DC | PRN

## 2023-05-06 MED ORDER — POLYETHYLENE GLYCOL 3350 17 G PO PACK: 17.0000 g | PACK | Freq: Every day | ORAL | Status: DC | PRN

## 2023-05-06 MED ORDER — ALBUTEROL SULFATE (2.5 MG/3ML) 0.083% IN NEBU
INHALATION_SOLUTION | RESPIRATORY_TRACT | Status: AC
Start: 2023-05-06 — End: 2023-05-06
  Administered 2023-05-06: 2.5 mg via RESPIRATORY_TRACT
  Filled 2023-05-06: qty 3

## 2023-05-06 MED ORDER — ROFLUMILAST 500 MCG PO TABS
250.0000 ug | ORAL_TABLET | Freq: Every day | ORAL | Status: DC
  Administered 2023-05-06 – 2023-05-11 (×6): 250 ug via ORAL
  Filled 2023-05-06 (×7): qty 1

## 2023-05-06 MED ORDER — UMECLIDINIUM BROMIDE 62.5 MCG/ACT IN AEPB
1.0000 | INHALATION_SPRAY | Freq: Every day | RESPIRATORY_TRACT | Status: DC
  Administered 2023-05-08 – 2023-05-11 (×3): 1 via RESPIRATORY_TRACT
  Filled 2023-05-06: qty 7

## 2023-05-06 MED ORDER — ACETAMINOPHEN 650 MG RE SUPP: 650.0000 mg | Freq: Four times a day (QID) | RECTAL | Status: DC | PRN

## 2023-05-06 MED ORDER — IPRATROPIUM-ALBUTEROL 0.5-2.5 (3) MG/3ML IN SOLN: 3.0000 mL | Freq: Four times a day (QID) | RESPIRATORY_TRACT | Status: DC | PRN

## 2023-05-06 MED ORDER — PANTOPRAZOLE SODIUM 40 MG PO TBEC
40.0000 mg | DELAYED_RELEASE_TABLET | Freq: Every day | ORAL | Status: DC
  Administered 2023-05-06 – 2023-05-11 (×6): 40 mg via ORAL
  Filled 2023-05-06 (×6): qty 1

## 2023-05-06 MED ORDER — METHYLPREDNISOLONE SODIUM SUCC 125 MG IJ SOLR
80.0000 mg | Freq: Every day | INTRAMUSCULAR | Status: DC
  Administered 2023-05-06 – 2023-05-11 (×6): 80 mg via INTRAVENOUS
  Filled 2023-05-06 (×6): qty 2

## 2023-05-06 MED ORDER — BUDESON-GLYCOPYRROL-FORMOTEROL 160-9-4.8 MCG/ACT IN AERO: 1.0000 | INHALATION_SPRAY | Freq: Two times a day (BID) | RESPIRATORY_TRACT | Status: DC

## 2023-05-06 MED ORDER — METHADONE HCL 10 MG PO TABS
100.0000 mg | ORAL_TABLET | Freq: Every morning | ORAL | Status: DC
  Administered 2023-05-06 – 2023-05-11 (×6): 100 mg via ORAL
  Filled 2023-05-06 (×6): qty 10

## 2023-05-06 MED ORDER — ACETAMINOPHEN 325 MG PO TABS
650.0000 mg | ORAL_TABLET | Freq: Four times a day (QID) | ORAL | Status: DC | PRN
  Administered 2023-05-06 – 2023-05-08 (×6): 650 mg via ORAL
  Filled 2023-05-06 (×6): qty 2

## 2023-05-06 NOTE — ED Notes (Signed)
CCMD notified

## 2023-05-06 NOTE — ED Notes (Signed)
 Pt ambulated 8 feet and refused to continue. Ambulating not tolerated. RN made aware

## 2023-05-06 NOTE — ED Notes (Signed)
 Admitting MD at [facility].

## 2023-05-06 NOTE — ED Notes (Signed)
 Resting comfortably, NAD, calm, interactive, family at [facility], denies needs or complaints at this time. Eating breakfast. Updated with timeframe, plan and process.

## 2023-05-06 NOTE — ED Notes (Signed)
 Anxious, asking for inhaler, speaking in clear complete sentences, VSS. SPO2 94% on 3L baseline.

## 2023-05-06 NOTE — H&P (Signed)
 [facility] History and Physical  Clarence Murray ZOX:[id number] DOB: 03-10-1965 DOA: 05/05/2023 PCP: Galvin Proffer, MD  Presented from: Home Chief Complaint: Shortness of breath  History of Present Illness: Clarence Murray is a 58 y.o. male with PMH significant for COPD on 3 L O2, chronic pain/opioid abuse on methadone, GERD Patient was brought to the ED last night from home by EMS with complaint of shortness of breath, chest tightness.  Symptoms started earlier in the evening and worsened. No fever, no productive cough. EMS gave IV Solu-Medrol, DuoNebs and brought to ED  Quit smoking several months ago.  Follows up with Dr. Delton Coombes.  Reports compliance to his regular medicines.  In the ED, patient was tachycardic to 110s, blood pressure normal, on 3 L oxygen at rest Initial labs with VBG with pH 7.35, pCO2 58, bicarb 32, WC count normal, troponin mildly elevated to 31, sodium level mildly low at 131, renal function normal Resp virus panel negative Urinalysis showed hazy yellow urine with negative leukocytes, rare bacteria Chest x-ray showed COPD with mild right basilar atelectatic changes.  EKG with sinus tachycardia at 111 bpm, QTc prolonged borderline to 491 ms  In the ED, patient was given nebulization.  Solu-Medrol was given by EMS earlier. Hospitalist service was consulted for inpatient management.  I received the patient as a carryover admission from last night. At the time of my evaluation, patient was propped up in bed.  On 3 L oxygen. Anxious, has scattered bilateral wheezing Son at bedside. History reviewed and detailed as above  Review of Systems:  All systems were reviewed and were negative unless otherwise mentioned in the HPI   Past medical history: Past Medical History:  Diagnosis Date   Chronic pain    work injury 02/ 2002 annuler tear C5-6 disk   COPD (chronic obstructive pulmonary disease) (HCC)    GERD (gastroesophageal reflux disease)    History of  [place] spotted fever age 24   Left inguinal hernia    Opioid abuse, in remission (HCC)    PER PT IN REMISSION SINCE 2012 , BEEN ON METHADONE SINCE    OSA (obstructive sleep apnea)    per study 06-08-2014 moderate osa / hypopnea syndrome---  PER PT "TITRATED FOR CPAP BUT WAS TOLD DID NOT NEED IT IF HE DIDN'T WANT TO"    Past surgical history: Past Surgical History:  Procedure Laterality Date   CARDIAC CATHETERIZATION  08-11-2006  dr cooper   Non-obstructive CAD-- minor plaque LAD and LCx/  Normal LVF , ef 65%   INGUINAL HERNIA REPAIR Left 10/02/2016   Procedure: LEFT OPEN INGUINAL HERNIA REPAIR WITH MESH;  Surgeon: Kinsinger, De Blanch, MD;  Location: [facility];  Service: General;  Laterality: Left;   INSERTION OF MESH Left 10/02/2016   Procedure: INSERTION OF MESH;  Surgeon: Kinsinger, De Blanch, MD;  Location: [facility];  Service: General;  Laterality: Left;   TRANSTHORACIC ECHOCARDIOGRAM  08/07/2006   good overall LVF and LVSF/ inadequate study for evaluation LV wall motion   UMBILICAL HERNIA REPAIR  09/2015    Social History:  reports that he quit smoking about 8 years ago. His smoking use included cigarettes. He started smoking about 23 years ago. He has a 30 pack-year smoking history. He has been exposed to tobacco smoke. He has never used smokeless tobacco. He reports that he does not drink alcohol and does not use drugs.  Allergies:  No Known Allergies Patient has no  known allergies.   Family history:  Family History  Problem Relation Age of Onset   Cancer Mother        Lung   Heart failure Maternal Grandmother    Heart attack Father        death at age 18 from MI   Heart attack Sister        death at age 92   COPD Brother      Physical Exam: Vitals:   05/06/23 0900 05/06/23 0915 05/06/23 0930 05/06/23 0945  BP: 126/70 101/84 115/61 110/63  Pulse: 93 89 87 91  Resp: (!) 21 16 15  (!) 22  Temp:      TempSrc:      SpO2:  92% 93% 95% 95%  Weight:      Height:       Wt Readings from Last 3 Encounters:  05/05/23 83.9 kg  10/16/22 85.5 kg  09/14/21 87.8 kg   Body mass index is 25.09 kg/m.  General exam: Pleasant, middle-aged Caucasian male Skin: No rashes, lesions or ulcers. HEENT: Atraumatic, normocephalic, no obvious bleeding Lungs: Has scattered bilateral wheezing, no crackles CVS: S1, S2, no murmur,   GI/Abd: Soft, nontender, nondistended, bowel sound present,   CNS: Alert, awake, oriented X 3 Psychiatry: Anxious Extremities: No pedal edema, no calf tenderness,    ----------------------------------------------------------------------------------------------------------------------------------------- ----------------------------------------------------------------------------------------------------------------------------------------- -----------------------------------------------------------------------------------------------------------------------------------------  Assessment/Plan: Principal Problem:   COPD exacerbation (HCC)  Acute exacerbation of COPD Chronic respiratory failure with hypoxia and hypercapnia Presented with shortness of breath, chest tightness in the setting of COPD, past history of smoking Resp virus panel negative Chest x-ray showed COPD with mild right basilar atelectatic changes.  EKG with sinus tachycardia at 111 bpm, QTc prolonged borderline to 491 ms Start a course of doxycycline for anti-inflammatory purpose. Continue IV Solu-Medrol at 80 mg daily Continue bronchodilators Check ambulatory oxygen requirement.  Chronically on 3 L oxygen at home Recent Labs  Lab 05/05/23 2307  WBC 10.5   Chronic pain/opioid abuse Continue methadone  GERD PPI  HLD Crestor  Mobility: Encourage ambulation  Goals of care:   Code Status: Full Code    DVT prophylaxis:  enoxaparin (LOVENOX) injection 40 mg Start: 05/06/23 1400   Antimicrobials: Doxycycline Fluid:  None Consultants: None Family Communication: Son at bedside  Dispo: The patient is from: Home              Anticipated d/c is to: Hopefully home in 1 to 2 days  Diet: Diet Order             Diet regular Room service appropriate? Yes; Fluid consistency: Thin  Diet effective now                    ------------------------------------------------------------------------------------- Severity of Illness: The appropriate patient status for this patient is OBSERVATION. Observation status is judged to be reasonable and necessary in order to provide the required intensity of service to ensure the patient's safety. The patient's presenting symptoms, physical exam findings, and initial radiographic and laboratory data in the context of their medical condition is felt to place them at decreased risk for further clinical deterioration. Furthermore, it is anticipated that the patient will be medically stable for discharge from the hospital within 2 midnights of admission.  -------------------------------------------------------------------------------------   Home Meds: Prior to Admission medications   Medication Sig Start Date End Date Taking? Authorizing Provider  albuterol (VENTOLIN HFA) 108 (90 Base) MCG/ACT inhaler Inhale 2 puffs into the lungs 2 (two) times daily. 05/15/17  Yes [provider]  BREZTRI AEROSPHERE 160-9-4.8 MCG/ACT AERO Inhale 1 puff into the lungs in the morning and at bedtime. 07/28/19  Yes [provider]  esomeprazole (NEXIUM) 40 MG capsule Take 40 mg by mouth as needed.    Yes [provider]  ipratropium-albuterol (DUONEB) 0.5-2.5 (3) MG/3ML SOLN Inhale 3 mLs into the lungs every 6 (six) hours as needed (shortness of breath). 10/16/22  Yes Leslye Peer, MD  methadone (DOLOPHINE) 10 MG/ML solution Take 100 mg by mouth every morning.   Yes [provider]  Roflumilast (DALIRESP) 250 MCG TABS Take 250 mcg by mouth daily. 04/05/22  Yes  Leslye Peer, MD  rosuvastatin (CRESTOR) 5 MG tablet Take 5 mg by mouth daily. 04/14/23  Yes [provider]    Labs on Admission:   CBC: Recent Labs  Lab 05/05/23 2307 05/05/23 2317  WBC 10.5  --   NEUTROABS 8.4*  --   HGB 12.8* 13.9  HCT 40.5 41.0  MCV 95.3  --   PLT 254  --     Basic Metabolic Panel: Recent Labs  Lab 05/05/23 2307 05/05/23 2317  NA 131* 130*  K 4.1 4.0  CL 88*  --   CO2 27  --   GLUCOSE 109*  --   BUN 26*  --   CREATININE 1.07  --   CALCIUM 8.7*  --     Liver Function Tests: Recent Labs  Lab 05/05/23 2307  AST 45*  ALT 16  ALKPHOS 74  BILITOT 2.0*  PROT 7.8  ALBUMIN 3.4*   No results for input(s): "LIPASE", "AMYLASE" in the last 168 hours. No results for input(s): "AMMONIA" in the last 168 hours.  Cardiac Enzymes: No results for input(s): "CKTOTAL", "CKMB", "CKMBINDEX", "TROPONINI" in the last 168 hours.  BNP (last 3 results) No results for input(s): "BNP" in the last 8760 hours.  ProBNP (last 3 results) No results for input(s): "PROBNP" in the last 8760 hours.  CBG: No results for input(s): "GLUCAP" in the last 168 hours.  Lipase  No results found for: "LIPASE"   Urinalysis    Component Value Date/Time   COLORURINE YELLOW 05/06/2023 0848   APPEARANCEUR HAZY (A) 05/06/2023 0848   LABSPEC 1.025 05/06/2023 0848   PHURINE 5.0 05/06/2023 0848   GLUCOSEU >=500 (A) 05/06/2023 0848   HGBUR SMALL (A) 05/06/2023 0848   BILIRUBINUR NEGATIVE 05/06/2023 0848   KETONESUR 20 (A) 05/06/2023 0848   PROTEINUR NEGATIVE 05/06/2023 0848   UROBILINOGEN 1.0 01/08/2008 1053   NITRITE NEGATIVE 05/06/2023 0848   LEUKOCYTESUR NEGATIVE 05/06/2023 0848     Drugs of Abuse     Component Value Date/Time   LABOPIA NONE DETECTED 03/16/2014 2100   COCAINSCRNUR NONE DETECTED 03/16/2014 2100   LABBENZ NONE DETECTED 03/16/2014 2100   AMPHETMU NONE DETECTED 03/16/2014 2100   THCU NONE DETECTED 03/16/2014 2100   LABBARB NONE DETECTED  03/16/2014 2100      Radiological Exams on Admission: DG Chest Port 1 View Result Date: 05/06/2023 CLINICAL DATA:  Shortness of breath. EXAM: PORTABLE CHEST 1 VIEW COMPARISON:  None Available. FINDINGS: The heart size and mediastinal contours are within normal limits. The lungs are hyperinflated. Diffuse, chronic appearing increased interstitial lung markings are noted. Mild right basilar atelectatic changes are suspected. There is no evidence of a pleural effusion or pneumothorax. Multilevel degenerative changes are noted throughout the thoracic spine. IMPRESSION: COPD with mild right basilar atelectatic changes. Electronically Signed   By: Aram Candela  M.D.   On: 05/06/2023 01:25     Signed, Lorin Glass, MD [facility] 05/06/2023

## 2023-05-06 NOTE — ED Notes (Signed)
 C/o difficulty urinating, and foul smelling urine. Urine sent.

## 2023-05-07 DIAGNOSIS — K219 Gastro-esophageal reflux disease without esophagitis: Secondary | ICD-10-CM | POA: Diagnosis present

## 2023-05-07 DIAGNOSIS — Z79891 Long term (current) use of opiate analgesic: Secondary | ICD-10-CM | POA: Diagnosis not present

## 2023-05-07 DIAGNOSIS — I2510 Atherosclerotic heart disease of native coronary artery without angina pectoris: Secondary | ICD-10-CM | POA: Diagnosis present

## 2023-05-07 DIAGNOSIS — G4733 Obstructive sleep apnea (adult) (pediatric): Secondary | ICD-10-CM | POA: Diagnosis present

## 2023-05-07 DIAGNOSIS — J9612 Chronic respiratory failure with hypercapnia: Secondary | ICD-10-CM | POA: Diagnosis present

## 2023-05-07 DIAGNOSIS — E875 Hyperkalemia: Secondary | ICD-10-CM | POA: Diagnosis present

## 2023-05-07 DIAGNOSIS — J219 Acute bronchiolitis, unspecified: Secondary | ICD-10-CM | POA: Diagnosis present

## 2023-05-07 DIAGNOSIS — J9621 Acute and chronic respiratory failure with hypoxia: Secondary | ICD-10-CM | POA: Diagnosis present

## 2023-05-07 DIAGNOSIS — E871 Hypo-osmolality and hyponatremia: Secondary | ICD-10-CM | POA: Diagnosis not present

## 2023-05-07 DIAGNOSIS — Z87891 Personal history of nicotine dependence: Secondary | ICD-10-CM | POA: Diagnosis not present

## 2023-05-07 DIAGNOSIS — Z9981 Dependence on supplemental oxygen: Secondary | ICD-10-CM | POA: Diagnosis not present

## 2023-05-07 DIAGNOSIS — F419 Anxiety disorder, unspecified: Secondary | ICD-10-CM | POA: Diagnosis present

## 2023-05-07 DIAGNOSIS — Z8249 Family history of ischemic heart disease and other diseases of the circulatory system: Secondary | ICD-10-CM | POA: Diagnosis not present

## 2023-05-07 DIAGNOSIS — F1110 Opioid abuse, uncomplicated: Secondary | ICD-10-CM | POA: Diagnosis present

## 2023-05-07 DIAGNOSIS — G8929 Other chronic pain: Secondary | ICD-10-CM | POA: Diagnosis present

## 2023-05-07 DIAGNOSIS — E785 Hyperlipidemia, unspecified: Secondary | ICD-10-CM | POA: Diagnosis present

## 2023-05-07 DIAGNOSIS — Z825 Family history of asthma and other chronic lower respiratory diseases: Secondary | ICD-10-CM | POA: Diagnosis not present

## 2023-05-07 DIAGNOSIS — Z1152 Encounter for screening for COVID-19: Secondary | ICD-10-CM | POA: Diagnosis not present

## 2023-05-07 DIAGNOSIS — Z79899 Other long term (current) drug therapy: Secondary | ICD-10-CM | POA: Diagnosis not present

## 2023-05-07 DIAGNOSIS — J441 Chronic obstructive pulmonary disease with (acute) exacerbation: Secondary | ICD-10-CM | POA: Diagnosis present

## 2023-05-07 LAB — CBC
HCT: 37.5 % — ABNORMAL LOW (ref 39.0–52.0)
Hemoglobin: 12.2 g/dL — ABNORMAL LOW (ref 13.0–17.0)
MCH: 31 pg (ref 26.0–34.0)
MCHC: 32.5 g/dL (ref 30.0–36.0)
MCV: 95.2 fL (ref 80.0–100.0)
Platelets: 264 10*3/uL (ref 150–400)
RBC: 3.94 MIL/uL — ABNORMAL LOW (ref 4.22–5.81)
RDW: 12.8 % (ref 11.5–15.5)
WBC: 9.1 10*3/uL (ref 4.0–10.5)
nRBC: 0 % (ref 0.0–0.2)

## 2023-05-07 LAB — BASIC METABOLIC PANEL
Anion gap: 7 (ref 5–15)
BUN: 24 mg/dL — ABNORMAL HIGH (ref 6–20)
CO2: 35 mmol/L — ABNORMAL HIGH (ref 22–32)
Calcium: 8.8 mg/dL — ABNORMAL LOW (ref 8.9–10.3)
Chloride: 90 mmol/L — ABNORMAL LOW (ref 98–111)
Creatinine, Ser: 0.71 mg/dL (ref 0.61–1.24)
GFR, Estimated: >60 mL/min (ref >60)
Glucose, Bld: 134 mg/dL — ABNORMAL HIGH (ref 70–99)
Potassium: 4.4 mmol/L (ref 3.5–5.1)
Sodium: 132 mmol/L — ABNORMAL LOW (ref 135–145)

## 2023-05-07 LAB — HIV ANTIBODY (ROUTINE TESTING W REFLEX): HIV Screen 4th Generation wRfx: NONREACTIVE

## 2023-05-07 MED ORDER — DM-GUAIFENESIN ER 30-600 MG PO TB12
1.0000 | ORAL_TABLET | Freq: Two times a day (BID) | ORAL | Status: DC
  Administered 2023-05-07 – 2023-05-11 (×9): 1 via ORAL
  Filled 2023-05-07 (×9): qty 1

## 2023-05-07 MED ORDER — ALPRAZOLAM 0.25 MG PO TABS
0.2500 mg | ORAL_TABLET | Freq: Three times a day (TID) | ORAL | Status: DC | PRN
  Administered 2023-05-07 – 2023-05-10 (×5): 0.25 mg via ORAL
  Filled 2023-05-07 (×5): qty 1

## 2023-05-07 NOTE — Progress Notes (Signed)
 PROGRESS NOTE  Clarence Murray  DOB: 1966/02/09  PCP: Galvin Proffer, MD ZOX:[id number]  DOA: 05/05/2023  LOS: 0 days  Hospital Day: 3  Brief narrative: Clarence Murray is a 58 y.o. male with PMH significant for COPD on 3 L O2, chronic pain/opioid abuse on methadone, GERD Patient was brought to the ED last night from home by EMS with complaint of shortness of breath, chest tightness.  Symptoms started earlier in the evening and worsened. No fever, no productive cough. EMS gave IV Solu-Medrol, DuoNebs and brought to ED  Quit smoking several months ago.  Follows up with Dr. Delton Coombes.  Reports compliance to his regular medicines.  In the ED, patient was tachycardic to 110s, blood pressure normal, on 3 L oxygen at rest Initial labs with VBG with pH 7.35, pCO2 58, bicarb 32, WC count normal, troponin mildly elevated to 31, sodium level mildly low at 131, renal function normal Resp virus panel negative Urinalysis showed hazy yellow urine with negative leukocytes, rare bacteria Chest x-ray showed COPD with mild right basilar atelectatic changes.  EKG with sinus tachycardia at 111 bpm, QTc prolonged borderline to 491 ms  In the ED, patient was given nebulization.  Solu-Medrol was given by EMS earlier. Admitted to [facility].  Subjective: Patient was seen and examined this morning.  Middle-aged Caucasian male.  Sitting up at the edge of the bed.  In mild distress. Just a few minutes ago, RN tried to ambulate him.  He got easily winded and required up to 5 L of oxygen.  Assessment/Plan: Acute exacerbation of COPD Chronic respiratory failure with hypoxia and hypercapnia Presented with shortness of breath, chest tightness in the setting of COPD, past history of smoking Resp virus panel negative Chest x-ray showed COPD with mild right basilar atelectatic changes.  EKG with sinus tachycardia at 111 bpm, QTc prolonged borderline to 491 ms Currently course of doxycycline for anti-inflammatory purpose.   Also on IV Solu-Medrol at 80 mg daily This morning, RN tried to ambulate him.  He got easily winded and required up to 5 L of oxygen.  Does not feel good enough to go home today. Continue bronchodilators from home. Check ambulatory oxygen requirement.  Chronically on 3 L oxygen at home.   Recent Labs  Lab 05/05/23 2307 05/07/23 0714  WBC 10.5 9.1   Hyponatremia Sodium level slightly low between 130 and 135.  Continue to monitor. Recent Labs  Lab 05/05/23 2307 05/05/23 2317 05/07/23 0714  NA 131* 130* 132*   Chronic pain/opioid abuse Continue methadone  GERD PPI  HLD Crestor  Mobility: Encourage ambulation  Goals of care:   Code Status: Full Code    DVT prophylaxis:  enoxaparin (LOVENOX) injection 40 mg Start: 05/06/23 1400   Antimicrobials: Doxycycline Fluid: None Consultants: None Family Communication: Son at bedside  Status: Observation Level of care:  Telemetry Medical   Patient is from: Home Needs to continue in-hospital care: Continues to wheeze.  Requires IV Solu-Medrol Anticipated d/c to: Hopefully home in 1 to 2 days    Diet:  Diet Order             Diet regular Room service appropriate? Yes; Fluid consistency: Thin  Diet effective now                   Scheduled Meds:  albuterol  3 mL Inhalation BID   doxycycline  100 mg Oral Q12H   enoxaparin (LOVENOX) injection  40 mg Subcutaneous Q24H  fluticasone furoate-vilanterol  1 puff Inhalation Daily   And   umeclidinium bromide  1 puff Inhalation Daily   methadone  100 mg Oral q morning   methylPREDNISolone (SOLU-MEDROL) injection  80 mg Intravenous Daily   pantoprazole  40 mg Oral Daily   roflumilast  250 mcg Oral Daily   rosuvastatin  5 mg Oral Daily    PRN meds: acetaminophen **OR** acetaminophen, bisacodyl, hydrALAZINE, ipratropium-albuterol, polyethylene glycol   Infusions:    Antimicrobials: Anti-infectives (From admission, onward)    Start     Dose/Rate Route Frequency  Ordered Stop   05/06/23 1100  doxycycline (VIBRA-TABS) tablet 100 mg        100 mg Oral Every 12 hours 05/06/23 1051         Objective: Vitals:   05/07/23 0639 05/07/23 0823  BP: 134/83 133/87  Pulse: 85 84  Resp: 16 17  Temp: 98 F (36.7 C) (!) 97.5 F (36.4 C)  SpO2: 96% 96%    Intake/Output Summary (Last 24 hours) at 05/07/2023 1157 Last data filed at 05/06/2023 2100 Gross per 24 hour  Intake 120 ml  Output 250 ml  Net -130 ml   Filed Weights   05/05/23 2259  Weight: 83.9 kg   Weight change:  Body mass index is 25.09 kg/m.   Physical Exam: General exam: Pleasant, middle-aged Caucasian male Skin: No rashes, lesions or ulcers. HEENT: Atraumatic, normocephalic, no obvious bleeding Lungs: Continues to have scattered bilateral wheezing, no crackles CVS: S1, S2, no murmur,   GI/Abd: Soft, nontender, nondistended, bowel sound present,   CNS: Alert, awake, oriented X 3 Psychiatry: Anxious Extremities: No pedal edema, no calf tenderness,   Data Review: I have personally reviewed the laboratory data and studies available.  F/u labs  Unresulted Labs (From admission, onward)     Start     Ordered   05/08/23 0500  CBC with Differential/Platelet  Tomorrow morning,   R        05/07/23 1157   05/08/23 0500  Basic metabolic panel  Tomorrow morning,   R        05/07/23 1157            Total time spent in review of labs and imaging, patient evaluation, formulation of plan, documentation and communication with family: 45 minutes  Signed, Lorin Glass, MD [facility] 05/07/2023

## 2023-05-07 NOTE — Plan of Care (Signed)
  Problem: Clinical Measurements: Goal: Respiratory complications will improve Outcome: Progressing Goal: Cardiovascular complication will be avoided Outcome: Progressing   Problem: Coping: Goal: Level of anxiety will decrease Outcome: Progressing   Problem: Pain Managment: Goal: General experience of comfort will improve and/or be controlled Outcome: Progressing

## 2023-05-08 DIAGNOSIS — J441 Chronic obstructive pulmonary disease with (acute) exacerbation: Secondary | ICD-10-CM | POA: Diagnosis not present

## 2023-05-08 LAB — CBC WITH DIFFERENTIAL/PLATELET
Abs Immature Granulocytes: 0.03 10*3/uL (ref 0.00–0.07)
Basophils Absolute: 0 10*3/uL (ref 0.0–0.1)
Basophils Relative: 0 %
Eosinophils Absolute: 0 10*3/uL (ref 0.0–0.5)
Eosinophils Relative: 0 %
HCT: 39.6 % (ref 39.0–52.0)
Hemoglobin: 12.5 g/dL — ABNORMAL LOW (ref 13.0–17.0)
Immature Granulocytes: 0 %
Lymphocytes Relative: 9 %
Lymphs Abs: 1 10*3/uL (ref 0.7–4.0)
MCH: 30.3 pg (ref 26.0–34.0)
MCHC: 31.6 g/dL (ref 30.0–36.0)
MCV: 95.9 fL (ref 80.0–100.0)
Monocytes Absolute: 1.2 10*3/uL — ABNORMAL HIGH (ref 0.1–1.0)
Monocytes Relative: 11 %
Neutro Abs: 8.9 10*3/uL — ABNORMAL HIGH (ref 1.7–7.7)
Neutrophils Relative %: 80 %
Platelets: 283 10*3/uL (ref 150–400)
RBC: 4.13 MIL/uL — ABNORMAL LOW (ref 4.22–5.81)
RDW: 12.9 % (ref 11.5–15.5)
WBC: 11.2 10*3/uL — ABNORMAL HIGH (ref 4.0–10.5)
nRBC: 0 % (ref 0.0–0.2)

## 2023-05-08 LAB — BASIC METABOLIC PANEL
Anion gap: 11 (ref 5–15)
BUN: 37 mg/dL — ABNORMAL HIGH (ref 6–20)
CO2: 35 mmol/L — ABNORMAL HIGH (ref 22–32)
Calcium: 9 mg/dL (ref 8.9–10.3)
Chloride: 90 mmol/L — ABNORMAL LOW (ref 98–111)
Creatinine, Ser: 0.92 mg/dL (ref 0.61–1.24)
GFR, Estimated: >60 mL/min (ref >60)
Glucose, Bld: 89 mg/dL (ref 70–99)
Potassium: 5.3 mmol/L — ABNORMAL HIGH (ref 3.5–5.1)
Sodium: 136 mmol/L (ref 135–145)

## 2023-05-08 LAB — D-DIMER, QUANTITATIVE: D-Dimer, Quant: <0.27 ug{FEU}/mL (ref 0.00–0.50)

## 2023-05-08 NOTE — Progress Notes (Signed)
 PROGRESS NOTE  Clarence Murray  DOB: December 19, 1965  PCP: Galvin Proffer, MD ZOX:[id number]  DOA: 05/05/2023  LOS: 1 day  Hospital Day: 4  Brief narrative: Clarence Murray is a 58 y.o. male with PMH significant for COPD on 3 L O2, chronic pain/opioid abuse on methadone, GERD Patient was brought to the ED last night from home by EMS with complaint of shortness of breath, chest tightness.  Symptoms started earlier in the evening and worsened. No fever, no productive cough. EMS gave IV Solu-Medrol, DuoNebs and brought to ED  Quit smoking several months ago.  Follows up with Dr. Delton Coombes.  Reports compliance to his regular medicines.  In the ED, patient was tachycardic to 110s, blood pressure normal, on 3 L oxygen at rest Initial labs with VBG with pH 7.35, pCO2 58, bicarb 32, WC count normal, troponin mildly elevated to 31, sodium level mildly low at 131, renal function normal Resp virus panel negative Urinalysis showed hazy yellow urine with negative leukocytes, rare bacteria Chest x-ray showed COPD with mild right basilar atelectatic changes.  EKG with sinus tachycardia at 111 bpm, QTc prolonged borderline to 491 ms  In the ED, patient was given nebulization.  Solu-Medrol was given by EMS earlier. Admitted to [facility].  Subjective: Patient was seen and examined this morning.   Sitting up in bed.  Says he is not feeling good.  On 4 L oxygen.  He says he tried to walk to the bathroom earlier and got very winded.   Seems anxious all the time.  Assessment/Plan: Acute exacerbation of COPD Chronic respiratory failure with hypoxia and hypercapnia Presented with shortness of breath, chest tightness in the setting of COPD, past history of smoking Resp virus panel negative Chest x-ray showed COPD with mild right basilar atelectatic changes.  EKG with sinus tachycardia at 111 bpm, QTc prolonged borderline to 491 ms Currently course of doxycycline for anti-inflammatory purpose.  Also on IV Solu-Medrol  at 80 mg daily Says he tried to walk to the bathroom earlier today and got very winded.  Does not feel better than how he came in. Will obtain a CT angio chest. I will ask the nurse to try to ambulate him on the hallway every day. Continue bronchodilators from home. Continue Mucinex DM. Check ambulatory oxygen requirement.  Chronically on 3 L oxygen at home.   Recent Labs  Lab 05/05/23 2307 05/07/23 0714 05/08/23 0710  WBC 10.5 9.1 11.2*   Hyponatremia Sodium level slightly low between 130 and 135.  Continue to monitor. Recent Labs  Lab 05/05/23 2307 05/05/23 2317 05/07/23 0714 05/08/23 0710  NA 131* 130* 132* 136   Hyperkalemia Potassium level elevated 5.3 today.  True versus hemolyzed sample. Repeat labs tomorrow. Recent Labs  Lab 05/05/23 2307 05/05/23 2317 05/07/23 0714 05/08/23 0710  K 4.1 4.0 4.4 5.3*   Chronic pain/opioid abuse Continue methadone  GERD PPI  HLD Crestor  Mobility: Encourage ambulation  Goals of care:   Code Status: Full Code    DVT prophylaxis:  enoxaparin (LOVENOX) injection 40 mg Start: 05/06/23 1400   Antimicrobials: Doxycycline Fluid: None Consultants: None Family Communication: Son at bedside  Status: Inpatient Level of care:  Telemetry Medical   Patient is from: Home Needs to continue in-hospital care: Continues to wheeze.  Requires IV Solu-Medrol.  Obtain CT angio chest Anticipated d/c to: Hopefully home in 1 to 2 days    Diet:  Diet Order  Diet regular Room service appropriate? Yes; Fluid consistency: Thin  Diet effective now                   Scheduled Meds:  albuterol  3 mL Inhalation BID   dextromethorphan-guaiFENesin  1 tablet Oral BID   doxycycline  100 mg Oral Q12H   enoxaparin (LOVENOX) injection  40 mg Subcutaneous Q24H   fluticasone furoate-vilanterol  1 puff Inhalation Daily   And   umeclidinium bromide  1 puff Inhalation Daily   methadone  100 mg Oral q morning    methylPREDNISolone (SOLU-MEDROL) injection  80 mg Intravenous Daily   pantoprazole  40 mg Oral Daily   roflumilast  250 mcg Oral Daily   rosuvastatin  5 mg Oral Daily    PRN meds: acetaminophen **OR** acetaminophen, ALPRAZolam, bisacodyl, hydrALAZINE, ipratropium-albuterol, polyethylene glycol   Infusions:    Antimicrobials: Anti-infectives (From admission, onward)    Start     Dose/Rate Route Frequency Ordered Stop   05/06/23 1100  doxycycline (VIBRA-TABS) tablet 100 mg        100 mg Oral Every 12 hours 05/06/23 1051 05/11/23 0959       Objective: Vitals:   05/08/23 0738 05/08/23 0751  BP:  120/88  Pulse: 100 83  Resp: 18 17  Temp:  (!) 97.5 F (36.4 C)  SpO2: 96% 93%    Intake/Output Summary (Last 24 hours) at 05/08/2023 1413 Last data filed at 05/08/2023 0439 Gross per 24 hour  Intake --  Output 350 ml  Net -350 ml   Filed Weights   05/05/23 2259  Weight: 83.9 kg   Weight change:  Body mass index is 25.09 kg/m.   Physical Exam: General exam: Pleasant, middle-aged Caucasian male. In mild respiratory distress Skin: No rashes, lesions or ulcers. HEENT: Atraumatic, normocephalic, no obvious bleeding Lungs: Continues to have scattered bilateral wheezing, no crackles CVS: S1, S2, no murmur,   GI/Abd: Soft, nontender, nondistended, bowel sound present,   CNS: Alert, awake, oriented X 3 Psychiatry: Anxious Extremities: No pedal edema, no calf tenderness,   Data Review: I have personally reviewed the laboratory data and studies available.  F/u labs  Unresulted Labs (From admission, onward)     Start     Ordered   05/09/23 0500  Basic metabolic panel  Tomorrow morning,   R        05/08/23 1413   05/09/23 0500  CBC with Differential/Platelet  Tomorrow morning,   R        05/08/23 1413            Total time spent in review of labs and imaging, patient evaluation, formulation of plan, documentation and communication with family: 45  minutes  Signed, Lorin Glass, MD [facility] 05/08/2023

## 2023-05-08 NOTE — Plan of Care (Signed)

## 2023-05-08 NOTE — Plan of Care (Signed)
 Patient AAOx4, anxious. [place] 3L in place. Telemetry monitoring in progress. New IV placed to RFA. Patient did well with soup for dinner. Safety precautions maintained. Patient keeps bed elevated, educated on safety and patient continues to refuse low bed.  1930: Patient scheduled for CT, attempted to explain need IV for test. Patient became anxious and refused to go to test tonight. Night shift RN notified, and relayed to night provider.   Problem: Education: Goal: Knowledge of General Education information will improve Description: Including pain rating scale, medication(s)/side effects and non-pharmacologic comfort measures Outcome: Progressing   Problem: Health Behavior/Discharge Planning: Goal: Ability to manage health-related needs will improve Outcome: Progressing   Problem: Activity: Goal: Risk for activity intolerance will decrease Outcome: Progressing   Problem: Nutrition: Goal: Adequate nutrition will be maintained Outcome: Progressing   Problem: Coping: Goal: Level of anxiety will decrease Outcome: Progressing

## 2023-05-08 NOTE — Plan of Care (Signed)

## 2023-05-09 ENCOUNTER — Inpatient Hospital Stay (HOSPITAL_COMMUNITY)

## 2023-05-09 DIAGNOSIS — J441 Chronic obstructive pulmonary disease with (acute) exacerbation: Secondary | ICD-10-CM | POA: Diagnosis not present

## 2023-05-09 LAB — BASIC METABOLIC PANEL
Anion gap: 6 (ref 5–15)
BUN: 27 mg/dL — ABNORMAL HIGH (ref 6–20)
CO2: 40 mmol/L — ABNORMAL HIGH (ref 22–32)
Calcium: 8.2 mg/dL — ABNORMAL LOW (ref 8.9–10.3)
Chloride: 89 mmol/L — ABNORMAL LOW (ref 98–111)
Creatinine, Ser: 0.64 mg/dL (ref 0.61–1.24)
GFR, Estimated: >60 mL/min (ref >60)
Glucose, Bld: 107 mg/dL — ABNORMAL HIGH (ref 70–99)
Potassium: 3.8 mmol/L (ref 3.5–5.1)
Sodium: 135 mmol/L (ref 135–145)

## 2023-05-09 LAB — CBC WITH DIFFERENTIAL/PLATELET
Abs Immature Granulocytes: 0.07 10*3/uL (ref 0.00–0.07)
Basophils Absolute: 0 10*3/uL (ref 0.0–0.1)
Basophils Relative: 0 %
Eosinophils Absolute: 0 10*3/uL (ref 0.0–0.5)
Eosinophils Relative: 0 %
HCT: 39.5 % (ref 39.0–52.0)
Hemoglobin: 12.6 g/dL — ABNORMAL LOW (ref 13.0–17.0)
Immature Granulocytes: 1 %
Lymphocytes Relative: 14 %
Lymphs Abs: 1.4 10*3/uL (ref 0.7–4.0)
MCH: 30 pg (ref 26.0–34.0)
MCHC: 31.9 g/dL (ref 30.0–36.0)
MCV: 94 fL (ref 80.0–100.0)
Monocytes Absolute: 0.8 10*3/uL (ref 0.1–1.0)
Monocytes Relative: 8 %
Neutro Abs: 8.1 10*3/uL — ABNORMAL HIGH (ref 1.7–7.7)
Neutrophils Relative %: 77 %
Platelets: 262 10*3/uL (ref 150–400)
RBC: 4.2 MIL/uL — ABNORMAL LOW (ref 4.22–5.81)
RDW: 12.9 % (ref 11.5–15.5)
WBC: 10.4 10*3/uL (ref 4.0–10.5)
nRBC: 0 % (ref 0.0–0.2)

## 2023-05-09 MED ORDER — IOHEXOL 350 MG/ML SOLN
75.0000 mL | Freq: Once | INTRAVENOUS | Status: AC | PRN
  Administered 2023-05-09: 75 mL via INTRAVENOUS

## 2023-05-09 NOTE — Plan of Care (Signed)

## 2023-05-09 NOTE — Progress Notes (Signed)
 PROGRESS NOTE  Clarence Murray  DOB: Aug 17, 1965  PCP: Galvin Proffer, MD ZOX:[id number]  DOA: 05/05/2023  LOS: 2 days  Hospital Day: 5  Brief narrative: Clarence Murray is a 58 y.o. male with PMH significant for COPD on 3 L O2, chronic pain/opioid abuse on methadone, GERD Patient was brought to the ED last night from home by EMS with complaint of shortness of breath, chest tightness.  Symptoms started earlier in the evening and worsened. No fever, no productive cough. EMS gave IV Solu-Medrol, DuoNebs and brought to ED  Quit smoking several months ago.  Follows up with Dr. Delton Coombes.  Reports compliance to his regular medicines.  In the ED, patient was tachycardic to 110s, blood pressure normal, on 3 L oxygen at rest Initial labs with VBG with pH 7.35, pCO2 58, bicarb 32, WC count normal, troponin mildly elevated to 31, sodium level mildly low at 131, renal function normal Resp virus panel negative Urinalysis showed hazy yellow urine with negative leukocytes, rare bacteria Chest x-ray showed COPD with mild right basilar atelectatic changes.  EKG with sinus tachycardia at 111 bpm, QTc prolonged borderline to 491 ms  In the ED, patient was given nebulization.  Solu-Medrol was given by EMS earlier. Admitted to [facility].  Subjective: Patient was seen and examined this morning.   Propped up in bed.  Feels better than yesterday. I think his anxiety is driving more symptoms.   Pending CT chest today. Encouraged to ambulate out of the room today.  Assessment/Plan: Acute exacerbation of COPD Chronic respiratory failure with hypoxia and hypercapnia Presented with shortness of breath, chest tightness in the setting of COPD, past history of smoking Resp virus panel negative Chest x-ray showed COPD with mild right basilar atelectatic changes.  EKG with sinus tachycardia at 111 bpm, QTc prolonged borderline to 491 ms Currently course of doxycycline for anti-inflammatory purpose.  Also on IV  Solu-Medrol at 80 mg daily Says he tried to walk to the bathroom earlier yesterday and got very winded.  Does not feel better than how he came in. CT angio chest was ordered but he refused it last night primarily due to anxiety. Agrees to get it done today.  Less anxious today. Also encourage ambulation today. Continue bronchodilators from home. Continue Mucinex DM. Check ambulatory oxygen requirement.  Chronically on 3 L oxygen at home.   Recent Labs  Lab 05/05/23 2307 05/07/23 0714 05/08/23 0710 05/09/23 0743  WBC 10.5 9.1 11.2* 10.4   Hyponatremia Sodium level slightly low between 130 and 135.  Continue to monitor. Recent Labs  Lab 05/05/23 2307 05/05/23 2317 05/07/23 0714 05/08/23 0710 05/09/23 0743  NA 131* 130* 132* 136 135   Hyperkalemia Potassium level improved on repeat labs today Recent Labs  Lab 05/05/23 2307 05/05/23 2317 05/07/23 0714 05/08/23 0710 05/09/23 0743  K 4.1 4.0 4.4 5.3* 3.8   Chronic pain/opioid abuse Continue methadone  GERD PPI  HLD Crestor  Mobility: Encourage ambulation  Goals of care:   Code Status: Full Code    DVT prophylaxis:  enoxaparin (LOVENOX) injection 40 mg Start: 05/06/23 1400   Antimicrobials: Doxycycline Fluid: None Consultants: None Family Communication: Son at bedside  Status: Inpatient Level of care:  Telemetry Medical   Patient is from: Home Needs to continue in-hospital care: Continues to be in respiratory distress, gradually improving.  Continues to require IV Solu-Medrol.  Obtain CT angio chest Anticipated d/c to: Hopefully home in 1 to 2 days    Diet:  Diet Order             Diet regular Room service appropriate? Yes; Fluid consistency: Thin  Diet effective now                   Scheduled Meds:  albuterol  3 mL Inhalation BID   dextromethorphan-guaiFENesin  1 tablet Oral BID   doxycycline  100 mg Oral Q12H   enoxaparin (LOVENOX) injection  40 mg Subcutaneous Q24H   fluticasone  furoate-vilanterol  1 puff Inhalation Daily   And   umeclidinium bromide  1 puff Inhalation Daily   methadone  100 mg Oral q morning   methylPREDNISolone (SOLU-MEDROL) injection  80 mg Intravenous Daily   pantoprazole  40 mg Oral Daily   roflumilast  250 mcg Oral Daily   rosuvastatin  5 mg Oral Daily    PRN meds: acetaminophen **OR** acetaminophen, ALPRAZolam, bisacodyl, hydrALAZINE, ipratropium-albuterol, polyethylene glycol   Infusions:    Antimicrobials: Anti-infectives (From admission, onward)    Start     Dose/Rate Route Frequency Ordered Stop   05/06/23 1100  doxycycline (VIBRA-TABS) tablet 100 mg        100 mg Oral Every 12 hours 05/06/23 1051 05/11/23 0959       Objective: Vitals:   05/09/23 0409 05/09/23 0802  BP: (!) 147/78 118/76  Pulse: 87 80  Resp: 20 18  Temp: 98.1 F (36.7 C) 98.2 F (36.8 C)  SpO2: 94% 93%    Intake/Output Summary (Last 24 hours) at 05/09/2023 1153 Last data filed at 05/09/2023 1000 Gross per 24 hour  Intake --  Output 1225 ml  Net -1225 ml   Filed Weights   05/05/23 2259  Weight: 83.9 kg   Weight change:  Body mass index is 25.09 kg/m.   Physical Exam: General exam: Pleasant, middle-aged Caucasian male. In mild respiratory distress Skin: No rashes, lesions or ulcers. HEENT: Atraumatic, normocephalic, no obvious bleeding Lungs: no crackles.  Less wheezing today CVS: S1, S2, no murmur,   GI/Abd: Soft, nontender, nondistended, bowel sound present,   CNS: Alert, awake, oriented X 3 Psychiatry: Less anxious Extremities: No pedal edema, no calf tenderness,   Data Review: I have personally reviewed the laboratory data and studies available.  F/u labs  Unresulted Labs (From admission, onward)    None       Total time spent in review of labs and imaging, patient evaluation, formulation of plan, documentation and communication with family: 45 minutes  Signed, Lorin Glass, MD [facility] 05/09/2023

## 2023-05-09 NOTE — Plan of Care (Signed)
 Patient AAOx4, anxious when new IV placement. Feels better today, NC3L in place. Sat at EOB. Able to reposition independently. [place] extension given for patient to walk further in room. No complaints of pain. Productive sputum. Safety precautions maintained. Patient keeps bed elevated, educated on hospital safety, patient refuses to have bed in lowest position.   Problem: Education: Goal: Knowledge of General Education information will improve Description: Including pain rating scale, medication(s)/side effects and non-pharmacologic comfort measures Outcome: Progressing   Problem: Health Behavior/Discharge Planning: Goal: Ability to manage health-related needs will improve Outcome: Progressing   Problem: Clinical Measurements: Goal: Respiratory complications will improve Outcome: Progressing   Problem: Nutrition: Goal: Adequate nutrition will be maintained Outcome: Progressing

## 2023-05-10 DIAGNOSIS — J441 Chronic obstructive pulmonary disease with (acute) exacerbation: Secondary | ICD-10-CM | POA: Diagnosis not present

## 2023-05-10 NOTE — Plan of Care (Signed)
  Problem: Education: Goal: Knowledge of General Education information will improve Description: Including pain rating scale, medication(s)/side effects and non-pharmacologic comfort measures Outcome: Progressing   Problem: Health Behavior/Discharge Planning: Goal: Ability to manage health-related needs will improve Outcome: Progressing   Problem: Health Behavior/Discharge Planning: Goal: Ability to manage health-related needs will improve Outcome: Progressing   Problem: Clinical Measurements: Goal: Ability to maintain clinical measurements within normal limits will improve Outcome: Progressing Goal: Will remain free from infection Outcome: Progressing Goal: Diagnostic test results will improve Outcome: Progressing Goal: Respiratory complications will improve Outcome: Progressing Goal: Cardiovascular complication will be avoided Outcome: Progressing   

## 2023-05-10 NOTE — Plan of Care (Signed)
 Patient AAOx4. NC3L in place. Telemetry monitoring in progress. Patient walked in hallway with RW. No complaints of pain. Safety precautions maintained. Patient keeps bed elevated, educated on hospital safety, patient refuses to have bed in lowest position.    Ex Ox: RA at rest: 91% NC3L at rest: 94% Ambulatory on RA: 83% Ambulatory on NC3L: 90%   Problem: Education: Goal: Knowledge of General Education information will improve Description: Including pain rating scale, medication(s)/side effects and non-pharmacologic comfort measures Outcome: Progressing   Problem: Health Behavior/Discharge Planning: Goal: Ability to manage health-related needs will improve Outcome: Progressing   Problem: Activity: Goal: Risk for activity intolerance will decrease Outcome: Progressing   Problem: Nutrition: Goal: Adequate nutrition will be maintained Outcome: Progressing

## 2023-05-10 NOTE — Progress Notes (Signed)
 PROGRESS NOTE  Clarence Murray  DOB: [birth year]  PCP: Galvin Proffer, MD ZOX:[id number]  DOA: 05/05/2023  LOS: 3 days  Hospital Day: 6  Brief narrative: Clarence Murray is a 58 y.o. male with PMH significant for COPD on 3 L O2, chronic pain/opioid abuse on methadone, GERD Patient was brought to the ED last night from home by EMS with complaint of shortness of breath, chest tightness.  Symptoms started earlier in the evening and worsened. No fever, no productive cough. EMS gave IV Solu-Medrol, DuoNebs and brought to ED  Quit smoking several months ago.  Follows up with Dr. Delton Coombes.  Reports compliance to his regular medicines.  In the ED, patient was tachycardic to 110s, blood pressure normal, on 3 L oxygen at rest Initial labs with VBG with pH 7.35, pCO2 58, bicarb 32, WC count normal, troponin mildly elevated to 31, sodium level mildly low at 131, renal function normal Resp virus panel negative Urinalysis showed hazy yellow urine with negative leukocytes, rare bacteria Chest x-ray showed COPD with mild right basilar atelectatic changes.  EKG with sinus tachycardia at 111 bpm, QTc prolonged borderline to 491 ms  In the ED, patient was given nebulization.  Solu-Medrol was given by EMS earlier. Admitted to [facility].  Subjective: Patient was seen and examined this morning.  Propped up in bed.  Feels better than yesterday. Less anxious.  Has not ambulated today.  States he ambulated inside the room yesterday and felt a little better than the previous day.  Assessment/Plan: Acute exacerbation of COPD Chronic respiratory failure with hypoxia and hypercapnia Presented with shortness of breath, chest tightness in the setting of COPD, past history of smoking Resp virus panel negative Chest x-ray showed COPD with mild right basilar atelectatic changes.  EKG with sinus tachycardia at 111 bpm, QTc prolonged borderline to 491 ms Currently course of doxycycline for anti-inflammatory purpose.  Also  on IV Solu-Medrol at 80 mg daily Says he tried to walk to the bathroom earlier yesterday and got very winded.  Does not feel better than how he came in. CT angio chest was obtained on 3/7.  Showed bronchiolitis, did not show PE, pneumonia, effusion.   Seems to be gradually improving.  Minimal wheezing on exam today. Also encourage ambulation today. Continue bronchodilators from home. Continue Mucinex DM. Check ambulatory oxygen requirement.  Chronically on 3 L oxygen at home.   If continues to improve, plan to discharge home tomorrow morning. Recent Labs  Lab 05/05/23 2307 05/07/23 0714 05/08/23 0710 05/09/23 0743  WBC 10.5 9.1 11.2* 10.4   Hyponatremia Sodium level slightly low between 130 and 135.  Continue to monitor. Recent Labs  Lab 05/05/23 2307 05/05/23 2317 05/07/23 0714 05/08/23 0710 05/09/23 0743  NA 131* 130* 132* 136 135   Hyperkalemia Potassium level improved on repeat labs today Recent Labs  Lab 05/05/23 2307 05/05/23 2317 05/07/23 0714 05/08/23 0710 05/09/23 0743  K 4.1 4.0 4.4 5.3* 3.8   Chronic pain/opioid abuse Continue methadone  GERD PPI  HLD Crestor  Mobility: Encourage ambulation  Goals of care:   Code Status: Full Code    DVT prophylaxis:  enoxaparin (LOVENOX) injection 40 mg Start: 05/06/23 1400   Antimicrobials: Doxycycline Fluid: None Consultants: None Family Communication: Son at bedside  Status: Inpatient Level of care:  Telemetry Medical   Patient is from: Home Needs to continue in-hospital care: Improving respiratory distress.  Continues to require IV Solu-Medrol.  If continues to improve, discharge home tomorrow.  Diet:  Diet Order             Diet regular Room service appropriate? Yes; Fluid consistency: Thin  Diet effective now                   Scheduled Meds:  albuterol  3 mL Inhalation BID   dextromethorphan-guaiFENesin  1 tablet Oral BID   doxycycline  100 mg Oral Q12H   enoxaparin (LOVENOX)  injection  40 mg Subcutaneous Q24H   fluticasone furoate-vilanterol  1 puff Inhalation Daily   And   umeclidinium bromide  1 puff Inhalation Daily   methadone  100 mg Oral q morning   methylPREDNISolone (SOLU-MEDROL) injection  80 mg Intravenous Daily   pantoprazole  40 mg Oral Daily   roflumilast  250 mcg Oral Daily   rosuvastatin  5 mg Oral Daily    PRN meds: acetaminophen **OR** acetaminophen, ALPRAZolam, bisacodyl, hydrALAZINE, ipratropium-albuterol, polyethylene glycol   Infusions:    Antimicrobials: Anti-infectives (From admission, onward)    Start     Dose/Rate Route Frequency Ordered Stop   05/06/23 1100  doxycycline (VIBRA-TABS) tablet 100 mg        100 mg Oral Every 12 hours 05/06/23 1051 05/11/23 0959       Objective: Vitals:   05/10/23 0752 05/10/23 0753  BP:    Pulse:    Resp:    Temp:    SpO2: 95% 96%    Intake/Output Summary (Last 24 hours) at 05/10/2023 1118 Last data filed at 05/10/2023 0502 Gross per 24 hour  Intake --  Output 250 ml  Net -250 ml   Filed Weights   05/05/23 2259  Weight: 83.9 kg   Weight change:  Body mass index is 25.09 kg/m.   Physical Exam: General exam: Pleasant, middle-aged Caucasian male. In mild respiratory distress Skin: No rashes, lesions or ulcers. HEENT: Atraumatic, normocephalic, no obvious bleeding Lungs: no crackles.  Mild scattered wheezing.  Improving status CVS: S1, S2, no murmur,   GI/Abd: Soft, nontender, nondistended, bowel sound present,   CNS: Alert, awake, oriented X 3 Psychiatry: Less anxious Extremities: No pedal edema, no calf tenderness,   Data Review: I have personally reviewed the laboratory data and studies available.  F/u labs  Unresulted Labs (From admission, onward)    None       Total time spent in review of labs and imaging, patient evaluation, formulation of plan, documentation and communication with family: 45 minutes  Signed, Lorin Glass, MD [facility] 05/10/2023

## 2023-05-11 ENCOUNTER — Other Ambulatory Visit (HOSPITAL_COMMUNITY): Payer: Self-pay

## 2023-05-11 DIAGNOSIS — J441 Chronic obstructive pulmonary disease with (acute) exacerbation: Secondary | ICD-10-CM | POA: Diagnosis not present

## 2023-05-11 MED ORDER — PREDNISONE 10 MG PO TABS: ORAL_TABLET | ORAL | 0 refills | Status: DC

## 2023-05-11 MED ORDER — DM-GUAIFENESIN ER 30-600 MG PO TB12
1.0000 | ORAL_TABLET | Freq: Two times a day (BID) | ORAL | 0 refills | Status: AC
Start: 2023-05-11 — End: 2023-05-18

## 2023-05-11 NOTE — Discharge Summary (Signed)
 Physician Discharge Summary  KARELL TUKES UEA:[id number] DOB: 06-03-1965 DOA: 05/05/2023  PCP: Galvin Proffer, MD  Admit date: 05/05/2023 Discharge date: 05/11/2023  Admitted From: Home Discharge disposition: Home  Recommendations at discharge:  Continue tapering course of prednisone Continue Mucinex DM for neck 7 days Continue bronchodilators as before Encourage regular ambulation. Follow-up with pulmonary as an outpatient    Brief narrative: Clarence Murray is a 58 y.o. male with PMH significant for COPD on 3 L O2, chronic pain/opioid abuse on methadone, GERD Patient was brought to the ED last night from home by EMS with complaint of shortness of breath, chest tightness.  Symptoms started earlier in the evening and worsened. No fever, no productive cough. EMS gave IV Solu-Medrol, DuoNebs and brought to ED  Quit smoking several months ago.  Follows up with Dr. Delton Coombes.  Reports compliance to his regular medicines.  In the ED, patient was tachycardic to 110s, blood pressure normal, on 3 L oxygen at rest Initial labs with VBG with pH 7.35, pCO2 58, bicarb 32, WC count normal, troponin mildly elevated to 31, sodium level mildly low at 131, renal function normal Resp virus panel negative Urinalysis showed hazy yellow urine with negative leukocytes, rare bacteria Chest x-ray showed COPD with mild right basilar atelectatic changes.  EKG with sinus tachycardia at 111 bpm, QTc prolonged borderline to 491 ms  In the ED, patient was given nebulization.  Solu-Medrol was given by EMS earlier. Admitted to [facility]. See below for hospital course  Subjective: Patient was seen and examined this morning.  Propped up in bed.  Feeling better.  Feeling ready to go home. Cough improving.  Minimal end expiratory wheezing on auscultation Son at bedside.  Hospital course: Acute exacerbation of COPD Chronic respiratory failure with hypoxia and hypercapnia Presented with shortness of breath, chest  tightness in the setting of COPD, past history of smoking Resp virus panel negative Chest x-ray showed COPD with mild right basilar atelectatic changes.  EKG with sinus tachycardia at 111 bpm, QTc prolonged borderline to 491 ms. Patient was started on a course of doxycycline, IV Solu-Medrol. Because of the severity of his COPD and underlying anxiety, recovery took longer than expected. 3/7, CT angio of chest was obtained which showed bronchiolitis, did not show PE, pneumonia, effusion. Completed 5-day course of doxycycline. Clinically much improving.  Feels ready to go home today. Continue tapering course of prednisone Continue Mucinex DM for neck 7 days Continue bronchodilators as before Encourage regular ambulation. Follows up with Dr. Delton Coombes. Recent Labs  Lab 05/05/23 2307 05/07/23 0714 05/08/23 0710 05/09/23 0743  WBC 10.5 9.1 11.2* 10.4   Hyponatremia Sodium level slightly low between 130 and 135.  Continue to monitor. Recent Labs  Lab 05/05/23 2307 05/05/23 2317 05/07/23 0714 05/08/23 0710 05/09/23 0743  NA 131* 130* 132* 136 135   Hyperkalemia Potassium level improved on repeat labs  Recent Labs  Lab 05/05/23 2307 05/05/23 2317 05/07/23 0714 05/08/23 0710 05/09/23 0743  K 4.1 4.0 4.4 5.3* 3.8   Chronic pain/opioid abuse Continue methadone  GERD PPI  HLD Crestor  Mobility: Encourage ambulation  Goals of care:   Code Status: Full Code   Consultants: None Family Communication: Son at bedside  Diet:  Diet Order             Diet general           Diet regular Room service appropriate? Yes; Fluid consistency: Thin  Diet effective now  Nutritional status:  Body mass index is 25.09 kg/m.       Wounds:  -    Discharge Exam:   Vitals:   05/10/23 2031 05/11/23 0415 05/11/23 0851 05/11/23 0902  BP: 118/76 119/76  115/74  Pulse: 75 72  82  Resp: 18 18  16   Temp: 98.3 F (36.8 C) 98.1 F (36.7 C)    TempSrc: Oral  Oral    SpO2: 95% 91% 93% 92%  Weight:      Height:        Body mass index is 25.09 kg/m.   General exam: Pleasant, middle-aged Caucasian male.  Skin: No rashes, lesions or ulcers. HEENT: Atraumatic, normocephalic, no obvious bleeding Lungs: no crackles.  Mild end expiratory wheezing bilaterally.  Better than yesterday. CVS: S1, S2, no murmur,   GI/Abd: Soft, nontender, nondistended, bowel sound present,   CNS: Alert, awake, oriented X 3 Psychiatry: Less anxious Extremities: No pedal edema, no calf tenderness,   Follow ups:    Follow-up Information     Hague, Myrene Galas, MD Follow up.   Specialty: Internal Medicine Contact information: [address] [place] [address] [phone]                 Discharge Instructions:   Discharge Instructions     Call MD for:  difficulty breathing, headache or visual disturbances   Complete by: As directed    Call MD for:  extreme fatigue   Complete by: As directed    Call MD for:  hives   Complete by: As directed    Call MD for:  persistant dizziness or light-headedness   Complete by: As directed    Call MD for:  persistant nausea and vomiting   Complete by: As directed    Call MD for:  severe uncontrolled pain   Complete by: As directed    Call MD for:  temperature >100.4   Complete by: As directed    Diet general   Complete by: As directed    Discharge instructions   Complete by: As directed    Recommendations at discharge:   Continue tapering course of prednisone  Continue Mucinex DM for neck 7 days  Continue bronchodilators as before  Encourage regular ambulation.  Follow-up with pulmonary as an outpatient  General discharge instructions: Follow with Primary MD Galvin Proffer, MD in 7 days  Please request your PCP  to go over your hospital tests, procedures, radiology results at the follow up. Please get your medicines reviewed and adjusted.  Your PCP may decide to repeat certain labs or tests  as needed. Do not drive, operate heavy machinery, perform activities at heights, swimming or participation in water activities or provide baby sitting services if your were admitted for syncope or siezures until you have seen by Primary MD or a Neurologist and advised to do so again. [place] Controlled Substance Reporting System database was reviewed. Do not drive, operate heavy machinery, perform activities at heights, swim, participate in water activities or provide baby-sitting services while on medications for pain, sleep and mood until your outpatient physician has reevaluated you and advised to do so again.  You are strongly recommended to comply with the dose, frequency and duration of prescribed medications. Activity: As tolerated with Full fall precautions use walker/cane & assistance as needed Avoid using any recreational substances like cigarette, tobacco, alcohol, or non-prescribed drug. If you experience worsening of your admission symptoms, develop shortness of breath, life threatening emergency,  suicidal or homicidal thoughts you must seek medical attention immediately by calling 911 or calling your MD immediately  if symptoms less severe. You must read complete instructions/literature along with all the possible adverse reactions/side effects for all the medicines you take and that have been prescribed to you. Take any new medicine only after you have completely understood and accepted all the possible adverse reactions/side effects.  Wear Seat belts while driving. You were cared for by a hospitalist during your hospital stay. If you have any questions about your discharge medications or the care you received while you were in the hospital after you are discharged, you can call the unit and ask to speak with the hospitalist or the covering physician. Once you are discharged, your primary care physician will handle any further medical issues. Please note that NO REFILLS for any  discharge medications will be authorized once you are discharged, as it is imperative that you return to your primary care physician (or establish a relationship with a primary care physician if you do not have one).   Increase activity slowly   Complete by: As directed        Discharge Medications:   Allergies as of 05/11/2023   No Known Allergies      Medication List     TAKE these medications    albuterol 108 (90 Base) MCG/ACT inhaler Commonly known as: VENTOLIN HFA Inhale 2 puffs into the lungs 2 (two) times daily.   Breztri Aerosphere 160-9-4.8 MCG/ACT Aero Generic drug: Budeson-Glycopyrrol-Formoterol Inhale 1 puff into the lungs in the morning and at bedtime.   dextromethorphan-guaiFENesin 30-600 MG 12hr tablet Commonly known as: MUCINEX DM Take 1 tablet by mouth 2 (two) times daily for 7 days.   esomeprazole 40 MG capsule Commonly known as: NEXIUM Take 40 mg by mouth as needed.   ipratropium-albuterol 0.5-2.5 (3) MG/3ML Soln Commonly known as: DUONEB Inhale 3 mLs into the lungs every 6 (six) hours as needed (shortness of breath).   methadone 10 MG/ML solution Commonly known as: DOLOPHINE Take 100 mg by mouth every morning.   predniSONE 10 MG tablet Commonly known as: DELTASONE Take 6 tabs twice a day for 2 days, then take 4 tabs twice a day for 2 days, then take 4 tabs daily for 2 days, then take 3 tabs daily for 2 days, then take 2 tabs daily for 2 days, then take 1 tab daily for 2 days, then STOP.   Roflumilast 250 MCG Tabs Commonly known as: Daliresp Take 250 mcg by mouth daily.   rosuvastatin 5 MG tablet Commonly known as: CRESTOR Take 5 mg by mouth daily.         The results of significant diagnostics from this hospitalization (including imaging, microbiology, ancillary and laboratory) are listed below for reference.    Procedures and Diagnostic Studies:   DG Chest Port 1 View Result Date: 05/06/2023 CLINICAL DATA:  Shortness of breath. EXAM:  PORTABLE CHEST 1 VIEW COMPARISON:  None Available. FINDINGS: The heart size and mediastinal contours are within normal limits. The lungs are hyperinflated. Diffuse, chronic appearing increased interstitial lung markings are noted. Mild right basilar atelectatic changes are suspected. There is no evidence of a pleural effusion or pneumothorax. Multilevel degenerative changes are noted throughout the thoracic spine. IMPRESSION: COPD with mild right basilar atelectatic changes. Electronically Signed   By: Aram Candela M.D.   On: 05/06/2023 01:25     Labs:   Basic Metabolic Panel: Recent Labs  Lab 05/05/23 2307  05/05/23 2317 05/07/23 0714 05/08/23 0710 05/09/23 0743  NA 131* 130* 132* 136 135  K 4.1 4.0 4.4 5.3* 3.8  CL 88*  --  90* 90* 89*  CO2 27  --  35* 35* 40*  GLUCOSE 109*  --  134* 89 107*  BUN 26*  --  24* 37* 27*  CREATININE 1.07  --  0.71 0.92 0.64  CALCIUM 8.7*  --  8.8* 9.0 8.2*   GFR Estimated Creatinine Clearance: 111.8 mL/min (by C-G formula based on SCr of 0.64 mg/dL). Liver Function Tests: Recent Labs  Lab 05/05/23 2307  AST 45*  ALT 16  ALKPHOS 74  BILITOT 2.0*  PROT 7.8  ALBUMIN 3.4*   No results for input(s): "LIPASE", "AMYLASE" in the last 168 hours. No results for input(s): "AMMONIA" in the last 168 hours. Coagulation profile No results for input(s): "INR", "PROTIME" in the last 168 hours.  CBC: Recent Labs  Lab 05/05/23 2307 05/05/23 2317 05/07/23 0714 05/08/23 0710 05/09/23 0743  WBC 10.5  --  9.1 11.2* 10.4  NEUTROABS 8.4*  --   --  8.9* 8.1*  HGB 12.8* 13.9 12.2* 12.5* 12.6*  HCT 40.5 41.0 37.5* 39.6 39.5  MCV 95.3  --  95.2 95.9 94.0  PLT 254  --  264 283 262   Cardiac Enzymes: No results for input(s): "CKTOTAL", "CKMB", "CKMBINDEX", "TROPONINI" in the last 168 hours. BNP: Invalid input(s): "POCBNP" CBG: No results for input(s): "GLUCAP" in the last 168 hours. D-Dimer Recent Labs    05/08/23 2015  DDIMER <0.27   Hgb  A1c No results for input(s): "HGBA1C" in the last 72 hours. Lipid Profile No results for input(s): "CHOL", "HDL", "LDLCALC", "TRIG", "CHOLHDL", "LDLDIRECT" in the last 72 hours. Thyroid function studies No results for input(s): "TSH", "T4TOTAL", "T3FREE", "THYROIDAB" in the last 72 hours.  Invalid input(s): "FREET3" Anemia work up No results for input(s): "VITAMINB12", "FOLATE", "FERRITIN", "TIBC", "IRON", "RETICCTPCT" in the last 72 hours. Microbiology Recent Results (from the past 240 hours)  Resp panel by RT-PCR (RSV, Flu A&B, Covid) Anterior Nasal Swab     Status: None   Collection Time: 05/05/23 10:55 PM   Specimen: Anterior Nasal Swab  Result Value Ref Range Status   SARS Coronavirus 2 by RT PCR NEGATIVE NEGATIVE Final   Influenza A by PCR NEGATIVE NEGATIVE Final   Influenza B by PCR NEGATIVE NEGATIVE Final    Comment: (NOTE) The Xpert Xpress SARS-CoV-2/FLU/RSV plus assay is intended as an aid in the diagnosis of influenza from Nasopharyngeal swab specimens and should not be used as a sole basis for treatment. Nasal washings and aspirates are unacceptable for Xpert Xpress SARS-CoV-2/FLU/RSV testing.  Fact Sheet for Patients: BloggerCourse.com  Fact Sheet for Healthcare Providers: SeriousBroker.it  This test is not yet approved or cleared by the [place] FDA and has been authorized for detection and/or diagnosis of SARS-CoV-2 by FDA under an Emergency Use Authorization (EUA). This EUA will remain in effect (meaning this test can be used) for the duration of the COVID-19 declaration under Section 564(b)(1) of the Act, 21 U.S.C. section 360bbb-3(b)(1), unless the authorization is terminated or revoked.     Resp Syncytial Virus by PCR NEGATIVE NEGATIVE Final    Comment: (NOTE) Fact Sheet for Patients: BloggerCourse.com  Fact Sheet for Healthcare  Providers: SeriousBroker.it  This test is not yet approved or cleared by the [place] FDA and has been authorized for detection and/or diagnosis of SARS-CoV-2 by FDA under an Emergency Use Authorization (  EUA). This EUA will remain in effect (meaning this test can be used) for the duration of the COVID-19 declaration under Section 564(b)(1) of the Act, 21 U.S.C. section 360bbb-3(b)(1), unless the authorization is terminated or revoked.  Performed at [facility], [address] [place], [place] [address]     Time coordinating discharge: 45 minutes  Signed: Taiden Raybourn  [facility] 05/11/2023, 10:38 AM

## 2023-05-11 NOTE — Progress Notes (Signed)
 Patient is waiting on oxygen tank before discharge. Currently on 3L of O2

## 2023-05-11 NOTE — Progress Notes (Signed)
 RNCM received message that patient needs Oxygen to go home with.  Patient is active with [employer] and no Oxygen at home to bring to hospital per patient-states he is out of Oxygen at home.  Morrie Sheldon at [facility] contacted with Oxygen need and Oxygen to be delivered to patient's room for discharge home today.

## 2023-05-12 ENCOUNTER — Other Ambulatory Visit (HOSPITAL_COMMUNITY): Payer: Self-pay

## 2023-05-28 ENCOUNTER — Ambulatory Visit (INDEPENDENT_AMBULATORY_CARE_PROVIDER_SITE_OTHER): Payer: 59 | Admitting: Nurse Practitioner

## 2023-05-28 ENCOUNTER — Encounter: Payer: Self-pay | Admitting: Nurse Practitioner

## 2023-05-28 VITALS — BP 140/80 | HR 89 | Ht 72.0 in | Wt 174.6 lb

## 2023-05-28 DIAGNOSIS — J441 Chronic obstructive pulmonary disease with (acute) exacerbation: Secondary | ICD-10-CM | POA: Diagnosis not present

## 2023-05-28 DIAGNOSIS — J9611 Chronic respiratory failure with hypoxia: Secondary | ICD-10-CM

## 2023-05-28 DIAGNOSIS — J219 Acute bronchiolitis, unspecified: Secondary | ICD-10-CM | POA: Insufficient documentation

## 2023-05-28 DIAGNOSIS — R591 Generalized enlarged lymph nodes: Secondary | ICD-10-CM | POA: Diagnosis not present

## 2023-05-28 DIAGNOSIS — J449 Chronic obstructive pulmonary disease, unspecified: Secondary | ICD-10-CM

## 2023-05-28 MED ORDER — PREDNISONE 10 MG PO TABS: ORAL_TABLET | ORAL | 0 refills | Status: DC

## 2023-05-28 NOTE — Assessment & Plan Note (Signed)
 Likely reactive. Repeat CT chest in 3 months.

## 2023-05-28 NOTE — Patient Instructions (Signed)
 Continue Albuterol inhaler 2 puffs or 3 mL neb every 6 hours as needed for shortness of breath or wheezing. Notify if symptoms persist despite rescue inhaler/neb use. Use nebulizer treatments twice a day before your Breztri  Continue Breztri 2 puffs Twice daily. Brush tongue and rinse mouth afterwards Continue daliresp daily Continue supplemental oxygen 2-3 lpm for goal >88-90%  Prednisone taper. 4 tabs for 3 days, then 3 tabs for 3 days, 2 tabs for 3 days, then 1 tab for 3 days, then stop. Take in AM with food Guaifenesin (Mucinex) [phone] mg Twice daily as needed for cough/congestion  Follow up in 4-6 weeks with Dr. Delton Coombes or Katie Paytience Bures,NP. If symptoms do not improve or worsen, please contact office for sooner follow up or seek emergency care.

## 2023-05-28 NOTE — Progress Notes (Signed)
 @Patient  ID: Clarence Murray, male    DOB: Jun 08, 1965, 58 y.o.   MRN: [id number]  Chief Complaint  Patient presents with   Follow-up    Patient states he was in the hospital for a week on 3/3. He is having sob.    Referring provider: Galvin Proffer, MD  HPI: 58 year old male, former smoker followed for COPD. He is a patient of Dr. Kavin Leech and last seen in office 10/16/2022. Past medical history significant for HLD, opioid abuse in remission on methadone, OSA not on CPAP, GERD.  TEST/EVENTS:  09/15/2019 PFT: FVC 47, FEV1 22, ratio 38, TLC 107, DLCOcor 54. Very severe COPD with reversibility (15% change) and severe diffusion defect 05/09/2023 CTA chest: no PE. Right hilar LAD with largest lymph node 1.7x2.8 cm, likely reactive. Moderate emphysematous changes. Scattered tree in bud opacities in b/l upper lobes, RML and b/l lower lobes. Small amount of airspace disease in RLL. Mild compression fx of endplate T4, favored chronic but new from prior.    10/16/2022: Ov with Dr. Delton Coombes. Former smoker with very severe COPD. Also with OSA (unable to use CPAP). Using POC 2-4 lpm. Maintenance regimen is Breztri, Daliresp, duoneb PRN approx 2x/day, albuterol HFA with rare use. Needs a new nebulizer machine. Flare about 2 months ago - received steroids and abx. Does still get exertional SOB, can be hard to recover after heavier exertion. Daily cough, white mucus. No change to regimen.   05/28/2023: Today - hospital follow up Discussed the use of AI scribe software for clinical note transcription with the patient, who gave verbal consent to proceed.  History of Present Illness   The patient, with COPD, presents with shortness of breath following a recent hospital discharge. The patient was recently admitted to the hospital 05/05/2023-05/11/2023 for acute exacerbation of COPD. He was discharged home on 3 liters of supplemental oxygen, which is his baseline. He was treated with empiric doxycycline and steroids. A CTA  of the chest was negative for pulmonary embolism but did show some bronchiolitis.   Since being discharged from the hospital, he's feeling better but still has shortness of breath, particularly when walking, and has not yet returned to his normal state. He did feel better on the steroids. They completed a three-day course of steroids after leaving the hospital, which provided some relief. Prior to hospitalization, they finished a course of doxycycline.  Their current medications include Breztri twice daily, Daliresp, and DuoNebs every other day. They are on three liters of oxygen, which is their baseline, and have not noted any low oxygen levels at home. They describe variability in their daily condition, with some days being better than others. It takes them about two hours in the morning to regain their breath and clear their chest.  Prior to his recent admission, the had a malfunction with his oxygen equipment, which left him without O2 overnight. This resulted in significant breathing difficulties the following day, leading to hospitalization.  They experience occasional cough, which is not severe and does not produce significant sputum. They report wheezing when they are short of breath. No fevers, chills, or hemoptysis. There is some swelling in their legs, which is consistent with their usual condition.        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-6 disk   COPD (chronic obstructive pulmonary disease) (HCC)    GERD (gastroesophageal reflux disease)    History of [place] spotted fever age 19   Left  inguinal hernia    Opioid abuse, in remission (HCC)    PER PT IN REMISSION SINCE 2012 , BEEN ON METHADONE SINCE    OSA (obstructive sleep apnea)    per study 06-08-2014 moderate osa / hypopnea syndrome---  PER PT "TITRATED FOR CPAP BUT WAS TOLD DID NOT NEED IT IF HE DIDN'T WANT TO"    Tobacco History: Social History   Tobacco Use  Smoking Status Former   Current packs/day: 0.00   Average packs/day: 2.0 packs/day for 15.0 years (30.0 ttl pk-yrs)   Types: Cigarettes   Start date: 09/27/1999   Quit date: 09/27/2014   Years since quitting: 8.6   Passive exposure: Past  Smokeless Tobacco Never   Counseling given: Not Answered   Outpatient Medications Prior to Visit  Medication Sig Dispense Refill   albuterol (VENTOLIN HFA) 108 (90 Base) MCG/ACT inhaler Inhale 2 puffs into the lungs 2 (two) times daily.     BREZTRI AEROSPHERE 160-9-4.8 MCG/ACT AERO Inhale 1 puff into the lungs in the morning and at bedtime.     esomeprazole (NEXIUM) 40 MG capsule Take 40 mg by mouth as needed.      ipratropium-albuterol (DUONEB) 0.5-2.5 (3) MG/3ML SOLN Inhale 3 mLs into the lungs every 6 (six) hours as needed (shortness of breath). 360 mL 5   methadone (DOLOPHINE) 10 MG/ML solution Take 100 mg by mouth every morning.     Roflumilast (DALIRESP) 250 MCG TABS Take 250 mcg by mouth daily. 30 tablet 0   rosuvastatin (CRESTOR) 5 MG tablet Take 5 mg by mouth daily.     predniSONE (DELTASONE) 10 MG tablet Take 6 tabs twice a day for 2 days, then take 4 tabs twice a day for 2 days, then take 4 tabs daily for 2 days, then take 3 tabs daily for 2 days, then take 2 tabs daily for 2 days, then take 1 tab daily for 2 days, then STOP. 60 tablet 0   No facility-administered medications prior to visit.     Review of Systems:   Constitutional: No weight loss or gain, night sweats, fevers, chills, or lassitude. +chronic fatigue  HEENT: No headaches, difficulty swallowing, tooth/dental problems, or sore throat. No  sneezing, itching, ear ache, nasal congestion, or post nasal drip CV:  +swelling in lower extremities (baseline). No chest pain, orthopnea, PND, anasarca, dizziness, palpitations, syncope Resp: +shortness of breath with exertion; chronic cough; occasional wheeze. No excess mucus or change in color of mucus. No hemoptysis. No chest wall deformity GI:  No heartburn, indigestion, abdominal pain, nausea, vomiting, diarrhea, change in bowel habits, loss of appetite, bloody stools.  GU: No dysuria, change in color of urine, urgency or frequency.  No flank pain, no hematuria  Neuro: No dizziness or lightheadedness.  Psych: No depression or anxiety. Mood stable.     Physical Exam:  BP (!) 140/80 (BP Location: Left Arm, Patient Position: Sitting, Cuff Size: Normal)   Pulse 89   Ht 6' (1.829 m)   Wt 174 lb 9.6 oz (79.2 kg)   SpO2 94% Comment: 3L POC  BMI 23.68 kg/m   GEN: Pleasant, interactive, chronically-ill appearing; in no acute distress HEENT:  Normocephalic and atraumatic. PERRLA. Sclera  white. Nasal turbinates pink, moist and patent bilaterally. No rhinorrhea present. Oropharynx pink and moist, without exudate or edema. No lesions, ulcerations, or postnasal drip.  NECK:  Supple w/ fair ROM. No JVD present. Normal carotid impulses w/o bruits. Thyroid symmetrical with no goiter or nodules palpated. No lymphadenopathy.   CV: RRR, no m/r/g, no peripheral edema. Pulses intact, +2 bilaterally. No cyanosis, pallor or clubbing. PULMONARY:  Unlabored, regular breathing. Diminished bilaterally A&P w/o wheezes/rales/rhonchi. No accessory muscle use.  GI: BS present and normoactive. Soft, non-tender to palpation. No organomegaly or masses detected.  MSK: No erythema, warmth or tenderness. Cap refil <2 sec all extrem. No deformities or joint swelling noted.  Neuro: A/Ox3. No focal deficits noted.   Skin: Warm, no lesions or rashe Psych: Normal affect and behavior. Judgement and thought content  appropriate.     Lab Results:  CBC    Component Value Date/Time   WBC 10.4 05/09/2023 0743   RBC 4.20 (L) 05/09/2023 0743   HGB 12.6 (L) 05/09/2023 0743   HCT 39.5 05/09/2023 0743   PLT 262 05/09/2023 0743   MCV 94.0 05/09/2023 0743   MCH 30.0 05/09/2023 0743   MCHC 31.9 05/09/2023 0743   RDW 12.9 05/09/2023 0743   LYMPHSABS 1.4 05/09/2023 0743   MONOABS 0.8 05/09/2023 0743   EOSABS 0.0 05/09/2023 0743   BASOSABS 0.0 05/09/2023 0743    BMET    Component Value Date/Time   NA 135 05/09/2023 0743   K 3.8 05/09/2023 0743   CL 89 (L) 05/09/2023 0743   CO2 40 (H) 05/09/2023 0743   GLUCOSE 107 (H) 05/09/2023 0743   BUN 27 (H) 05/09/2023 0743   CREATININE 0.64 05/09/2023 0743   CALCIUM 8.2 (L) 05/09/2023 0743   GFRNONAA >60 05/09/2023 0743   GFRAA >90 03/18/2014 0349    BNP    Component Value Date/Time   BNP 34.8 03/16/2014 1212     Imaging:  CT Angio Chest Pulmonary Embolism (PE) W or WO Contrast Result Date: 05/09/2023 CLINICAL DATA:  Dyspnea EXAM: CT ANGIOGRAPHY CHEST WITH CONTRAST TECHNIQUE: Multidetector CT imaging of the chest was performed using the standard protocol during bolus administration of intravenous contrast. Multiplanar CT image reconstructions and MIPs were obtained to evaluate the vascular anatomy. RADIATION DOSE REDUCTION: This exam was performed according to the departmental dose-optimization program which includes automated exposure control, adjustment of the mA and/or kV according to patient size and/or use of iterative reconstruction technique. CONTRAST:  75mL OMNIPAQUE IOHEXOL 350 MG/ML SOLN COMPARISON:  CT of the chest 03/16/2014 FINDINGS: Cardiovascular: Satisfactory opacification of the pulmonary arteries to the segmental level. No evidence of pulmonary embolism. Normal heart size. No pericardial effusion. Mediastinum/Nodes: There is right hilar lymphadenopathy with largest lymph node measuring 1.7 x 2.8 cm. Visualized esophagus and thyroid gland  are within normal limits. Lungs/Pleura: Moderate emphysematous changes are present. There are scattered tree-in-bud opacities in the bilateral upper lobes, right middle lobe and bilateral lower lobes. There is a small amount of airspace disease in the inferior right lower lobe. There is no pleural effusion or pneumothorax. Trachea and central airways are patent. Upper Abdomen: No acute abnormality. Musculoskeletal: There is mild compression deformity of the superior endplate of T4 which is new from prior, but favored as chronic. Multilevel degenerative changes affect the spine. Review of the MIP images confirms the above findings. IMPRESSION: 1. No evidence for pulmonary embolism. 2. Scattered tree-in-bud opacities in the bilateral lungs worrisome for infectious/inflammatory bronchiolitis. 3. Small amount of airspace  disease in the inferior right lower lobe worrisome for pneumonia. 4. Right hilar lymphadenopathy, likely reactive. 5. Mild compression deformity of the superior endplate of T4 is new from prior, but favored as chronic. 6. Emphysema. Emphysema (ICD10-J43.9). Electronically Signed   By: Darliss Cheney M.D.   On: 05/09/2023 18:40   DG Chest Port 1 View Result Date: 05/06/2023 CLINICAL DATA:  Shortness of breath. EXAM: PORTABLE CHEST 1 VIEW COMPARISON:  None Available. FINDINGS: The heart size and mediastinal contours are within normal limits. The lungs are hyperinflated. Diffuse, chronic appearing increased interstitial lung markings are noted. Mild right basilar atelectatic changes are suspected. There is no evidence of a pleural effusion or pneumothorax. Multilevel degenerative changes are noted throughout the thoracic spine. IMPRESSION: COPD with mild right basilar atelectatic changes. Electronically Signed   By: Aram Candela M.D.   On: 05/06/2023 01:25    Administration History     None          Latest Ref Rng & Units 09/15/2019   11:00 AM  PFT Results  FVC-Pre L 2.36    FVC-Predicted Pre % 47   FVC-Post L 2.57   FVC-Predicted Post % 51   Pre FEV1/FVC % % 36   Post FEV1/FCV % % 38   FEV1-Pre L 0.85   FEV1-Predicted Pre % 22   FEV1-Post L 0.99   DLCO uncorrected ml/min/mmHg 15.79   DLCO UNC% % 54   DLCO corrected ml/min/mmHg 15.79   DLCO COR %Predicted % 54   DLVA Predicted % 81   TLC L 7.49   TLC % Predicted % 107   RV % Predicted % 225     No results found for: "NITRICOXIDE"      Assessment & Plan:   COPD exacerbation (HCC) Very severe COPD with high symptom burden, on aggressive maintenance regimen. Slow to resolve AECOPD. We will restart him on steroids with prednisone taper. Hold off on further antimicrobial therapy as infectious symptoms have improved. Continue current maintenance regimen. Action plan in place.  Patient Instructions  Continue Albuterol inhaler 2 puffs or 3 mL neb every 6 hours as needed for shortness of breath or wheezing. Notify if symptoms persist despite rescue inhaler/neb use. Use nebulizer treatments twice a day before your Breztri  Continue Breztri 2 puffs Twice daily. Brush tongue and rinse mouth afterwards Continue daliresp daily Continue supplemental oxygen 2-3 lpm for goal >88-90%  Prednisone taper. 4 tabs for 3 days, then 3 tabs for 3 days, 2 tabs for 3 days, then 1 tab for 3 days, then stop. Take in AM with food Guaifenesin (Mucinex) [phone] mg Twice daily as needed for cough/congestion  Follow up in 4-6 weeks with Dr. Delton Coombes or Katie Demontrez Rindfleisch,NP. If symptoms do not improve or worsen, please contact office for sooner follow up or seek emergency care.    Chronic respiratory failure with hypoxia (HCC) No increased O2 requirement. Goal >88-90%  Bronchiolitis Changes of bronchiolitis and possible superimposed infection with reactive adenopathy on recent CT imaging. Slowly improving. Will plan to repeat CT chest in 3 months to ensure adenopathy has resolved.   Lymphadenopathy Likely reactive. Repeat CT chest  in 3 months.    Advised if symptoms do not improve or worsen, to please contact office for sooner follow up or seek emergency care.   I spent 35 minutes of dedicated to the care of this patient on the date of this encounter to include pre-visit review of records, face-to-face time with the patient discussing conditions  above, post visit ordering of testing, clinical documentation with the electronic health record, making appropriate referrals as documented, and communicating necessary findings to members of the patients care team.  Noemi Chapel, NP 05/28/2023  Pt aware and understands NP's role.

## 2023-05-28 NOTE — Assessment & Plan Note (Signed)
 Very severe COPD with high symptom burden, on aggressive maintenance regimen. Slow to resolve AECOPD. We will restart him on steroids with prednisone taper. Hold off on further antimicrobial therapy as infectious symptoms have improved. Continue current maintenance regimen. Action plan in place.  Patient Instructions  Continue Albuterol inhaler 2 puffs or 3 mL neb every 6 hours as needed for shortness of breath or wheezing. Notify if symptoms persist despite rescue inhaler/neb use. Use nebulizer treatments twice a day before your Breztri  Continue Breztri 2 puffs Twice daily. Brush tongue and rinse mouth afterwards Continue daliresp daily Continue supplemental oxygen 2-3 lpm for goal >88-90%  Prednisone taper. 4 tabs for 3 days, then 3 tabs for 3 days, 2 tabs for 3 days, then 1 tab for 3 days, then stop. Take in AM with food Guaifenesin (Mucinex) [phone] mg Twice daily as needed for cough/congestion  Follow up in 4-6 weeks with Dr. Delton Coombes or Katie Edyn Qazi,NP. If symptoms do not improve or worsen, please contact office for sooner follow up or seek emergency care.

## 2023-05-28 NOTE — Assessment & Plan Note (Signed)
 No increased O2 requirement. Goal >88-90%

## 2023-05-28 NOTE — Assessment & Plan Note (Signed)
 Changes of bronchiolitis and possible superimposed infection with reactive adenopathy on recent CT imaging. Slowly improving. Will plan to repeat CT chest in 3 months to ensure adenopathy has resolved.

## 2023-06-13 ENCOUNTER — Encounter (HOSPITAL_COMMUNITY): Payer: Self-pay | Admitting: Internal Medicine

## 2023-06-13 ENCOUNTER — Other Ambulatory Visit: Payer: Self-pay

## 2023-06-13 ENCOUNTER — Observation Stay (HOSPITAL_COMMUNITY)
Admission: EM | Admit: 2023-06-13 | Discharge: 2023-06-16 | Disposition: A | Discharge status: Home / Self Care | Attending: Internal Medicine | Admitting: Internal Medicine

## 2023-06-13 ENCOUNTER — Emergency Department (HOSPITAL_COMMUNITY)

## 2023-06-13 DIAGNOSIS — Z79899 Other long term (current) drug therapy: Secondary | ICD-10-CM | POA: Diagnosis not present

## 2023-06-13 DIAGNOSIS — J441 Chronic obstructive pulmonary disease with (acute) exacerbation: Secondary | ICD-10-CM | POA: Diagnosis not present

## 2023-06-13 DIAGNOSIS — F4320 Adjustment disorder, unspecified: Secondary | ICD-10-CM | POA: Diagnosis present

## 2023-06-13 DIAGNOSIS — G8929 Other chronic pain: Secondary | ICD-10-CM | POA: Diagnosis not present

## 2023-06-13 DIAGNOSIS — R0602 Shortness of breath: Secondary | ICD-10-CM | POA: Diagnosis present

## 2023-06-13 DIAGNOSIS — Z87891 Personal history of nicotine dependence: Secondary | ICD-10-CM | POA: Diagnosis not present

## 2023-06-13 DIAGNOSIS — E876 Hypokalemia: Secondary | ICD-10-CM | POA: Insufficient documentation

## 2023-06-13 DIAGNOSIS — F1120 Opioid dependence, uncomplicated: Secondary | ICD-10-CM | POA: Insufficient documentation

## 2023-06-13 DIAGNOSIS — Z1152 Encounter for screening for COVID-19: Secondary | ICD-10-CM | POA: Diagnosis not present

## 2023-06-13 DIAGNOSIS — R0600 Dyspnea, unspecified: Principal | ICD-10-CM

## 2023-06-13 DIAGNOSIS — E785 Hyperlipidemia, unspecified: Secondary | ICD-10-CM | POA: Diagnosis present

## 2023-06-13 DIAGNOSIS — G4733 Obstructive sleep apnea (adult) (pediatric): Secondary | ICD-10-CM | POA: Diagnosis not present

## 2023-06-13 DIAGNOSIS — K219 Gastro-esophageal reflux disease without esophagitis: Secondary | ICD-10-CM | POA: Diagnosis not present

## 2023-06-13 LAB — COMPREHENSIVE METABOLIC PANEL WITH GFR
ALT: 10 U/L (ref 0–44)
AST: 14 U/L — ABNORMAL LOW (ref 15–41)
Albumin: 3.4 g/dL — ABNORMAL LOW (ref 3.5–5.0)
Alkaline Phosphatase: 87 U/L (ref 38–126)
Anion gap: 11 (ref 5–15)
BUN: 17 mg/dL (ref 6–20)
CO2: 33 mmol/L — ABNORMAL HIGH (ref 22–32)
Calcium: 8.5 mg/dL — ABNORMAL LOW (ref 8.9–10.3)
Chloride: 90 mmol/L — ABNORMAL LOW (ref 98–111)
Creatinine, Ser: 0.52 mg/dL — ABNORMAL LOW (ref 0.61–1.24)
GFR, Estimated: >60 mL/min (ref >60)
Glucose, Bld: 156 mg/dL — ABNORMAL HIGH (ref 70–99)
Potassium: 2.9 mmol/L — ABNORMAL LOW (ref 3.5–5.1)
Sodium: 134 mmol/L — ABNORMAL LOW (ref 135–145)
Total Bilirubin: 1.1 mg/dL (ref 0.0–1.2)
Total Protein: 7.2 g/dL (ref 6.5–8.1)

## 2023-06-13 LAB — CBC WITH DIFFERENTIAL/PLATELET
Abs Immature Granulocytes: 0.05 10*3/uL (ref 0.00–0.07)
Basophils Absolute: 0 10*3/uL (ref 0.0–0.1)
Basophils Relative: 0 %
Eosinophils Absolute: 0.1 10*3/uL (ref 0.0–0.5)
Eosinophils Relative: 1 %
HCT: 37 % — ABNORMAL LOW (ref 39.0–52.0)
Hemoglobin: 11.7 g/dL — ABNORMAL LOW (ref 13.0–17.0)
Immature Granulocytes: 1 %
Lymphocytes Relative: 15 %
Lymphs Abs: 1.3 10*3/uL (ref 0.7–4.0)
MCH: 30.1 pg (ref 26.0–34.0)
MCHC: 31.6 g/dL (ref 30.0–36.0)
MCV: 95.1 fL (ref 80.0–100.0)
Monocytes Absolute: 0.6 10*3/uL (ref 0.1–1.0)
Monocytes Relative: 7 %
Neutro Abs: 6.1 10*3/uL (ref 1.7–7.7)
Neutrophils Relative %: 76 %
Platelets: 355 10*3/uL (ref 150–400)
RBC: 3.89 MIL/uL — ABNORMAL LOW (ref 4.22–5.81)
RDW: 13.3 % (ref 11.5–15.5)
WBC: 8.1 10*3/uL (ref 4.0–10.5)
nRBC: 0 % (ref 0.0–0.2)

## 2023-06-13 LAB — MAGNESIUM: Magnesium: 2 mg/dL (ref 1.7–2.4)

## 2023-06-13 LAB — RESP PANEL BY RT-PCR (RSV, FLU A&B, COVID)  RVPGX2
Influenza A by PCR: NEGATIVE
Influenza B by PCR: NEGATIVE
Resp Syncytial Virus by PCR: NEGATIVE
SARS Coronavirus 2 by RT PCR: NEGATIVE

## 2023-06-13 MED ORDER — ENOXAPARIN SODIUM 40 MG/0.4ML IJ SOSY
40.0000 mg | PREFILLED_SYRINGE | INTRAMUSCULAR | Status: DC
  Administered 2023-06-13 – 2023-06-15 (×3): 40 mg via SUBCUTANEOUS
  Filled 2023-06-13 (×3): qty 0.4

## 2023-06-13 MED ORDER — ONDANSETRON HCL 4 MG/2ML IJ SOLN: 4.0000 mg | Freq: Four times a day (QID) | INTRAMUSCULAR | Status: DC | PRN

## 2023-06-13 MED ORDER — LORAZEPAM 1 MG PO TABS
1.0000 mg | ORAL_TABLET | ORAL | Status: DC | PRN
  Administered 2023-06-13 – 2023-06-15 (×3): 1 mg via ORAL
  Filled 2023-06-13 (×3): qty 1

## 2023-06-13 MED ORDER — ONDANSETRON HCL 4 MG PO TABS: 4.0000 mg | ORAL_TABLET | Freq: Four times a day (QID) | ORAL | Status: DC | PRN

## 2023-06-13 MED ORDER — POTASSIUM CHLORIDE CRYS ER 20 MEQ PO TBCR
40.0000 meq | EXTENDED_RELEASE_TABLET | Freq: Once | ORAL | Status: AC
  Administered 2023-06-13: 40 meq via ORAL
  Filled 2023-06-13: qty 2

## 2023-06-13 MED ORDER — ALBUTEROL SULFATE (2.5 MG/3ML) 0.083% IN NEBU
2.5000 mg | INHALATION_SOLUTION | RESPIRATORY_TRACT | Status: DC | PRN
  Filled 2023-06-13: qty 3

## 2023-06-13 MED ORDER — BUDESON-GLYCOPYRROL-FORMOTEROL 160-9-4.8 MCG/ACT IN AERO
1.0000 | INHALATION_SPRAY | Freq: Two times a day (BID) | RESPIRATORY_TRACT | Status: DC
  Administered 2023-06-13 – 2023-06-16 (×7): 1 via RESPIRATORY_TRACT
  Filled 2023-06-13: qty 5.9

## 2023-06-13 MED ORDER — POTASSIUM CHLORIDE 10 MEQ/100ML IV SOLN
10.0000 meq | Freq: Once | INTRAVENOUS | Status: AC
  Administered 2023-06-13: 10 meq via INTRAVENOUS
  Filled 2023-06-13: qty 100

## 2023-06-13 MED ORDER — ACETAMINOPHEN 650 MG RE SUPP: 650.0000 mg | Freq: Four times a day (QID) | RECTAL | Status: DC | PRN

## 2023-06-13 MED ORDER — ROSUVASTATIN CALCIUM 5 MG PO TABS
5.0000 mg | ORAL_TABLET | Freq: Every day | ORAL | Status: DC
  Administered 2023-06-14 – 2023-06-16 (×3): 5 mg via ORAL
  Filled 2023-06-13 (×3): qty 1

## 2023-06-13 MED ORDER — IPRATROPIUM-ALBUTEROL 0.5-2.5 (3) MG/3ML IN SOLN
3.0000 mL | Freq: Once | RESPIRATORY_TRACT | Status: AC
  Administered 2023-06-13: 3 mL via RESPIRATORY_TRACT
  Filled 2023-06-13: qty 3

## 2023-06-13 MED ORDER — METHYLPREDNISOLONE SODIUM SUCC 40 MG IJ SOLR
40.0000 mg | Freq: Two times a day (BID) | INTRAMUSCULAR | Status: AC
  Administered 2023-06-14 (×2): 40 mg via INTRAVENOUS
  Filled 2023-06-13 (×2): qty 1

## 2023-06-13 MED ORDER — METHADONE HCL 10 MG/ML PO CONC
100.0000 mg | Freq: Every morning | ORAL | Status: DC
  Administered 2023-06-13 – 2023-06-16 (×4): 100 mg via ORAL
  Filled 2023-06-13 (×5): qty 10

## 2023-06-13 MED ORDER — ROFLUMILAST 500 MCG PO TABS
250.0000 ug | ORAL_TABLET | Freq: Every day | ORAL | Status: DC
  Administered 2023-06-14 – 2023-06-16 (×3): 250 ug via ORAL
  Filled 2023-06-13 (×3): qty 1

## 2023-06-13 MED ORDER — PANTOPRAZOLE SODIUM 40 MG PO TBEC
40.0000 mg | DELAYED_RELEASE_TABLET | Freq: Every day | ORAL | Status: DC
  Administered 2023-06-14 – 2023-06-16 (×3): 40 mg via ORAL
  Filled 2023-06-13 (×3): qty 1

## 2023-06-13 MED ORDER — TRAZODONE HCL 50 MG PO TABS: 25.0000 mg | ORAL_TABLET | Freq: Every evening | ORAL | Status: DC | PRN

## 2023-06-13 MED ORDER — ACETAMINOPHEN 325 MG PO TABS
650.0000 mg | ORAL_TABLET | Freq: Four times a day (QID) | ORAL | Status: DC | PRN
  Administered 2023-06-14: 650 mg via ORAL
  Filled 2023-06-13: qty 2

## 2023-06-13 NOTE — Plan of Care (Signed)
   Problem: Education: Goal: Knowledge of General Education information will improve Description Including pain rating scale, medication(s)/side effects and non-pharmacologic comfort measures Outcome: Progressing   Problem: Health Behavior/Discharge Planning: Goal: Ability to manage health-related needs will improve Outcome: Progressing

## 2023-06-13 NOTE — ED Provider Notes (Signed)
 [place] EMERGENCY DEPARTMENT AT [facility] Provider Note   CSN: [id number] Arrival date & time: 06/13/23  4166     History  Chief Complaint  Patient presents with   Shortness of Breath    Clarence Murray is a 58 y.o. male.  58 year old male with prior medical history as detailed below presents for evaluation.  Patient with known history of COPD.  He reports using roughly 3 L nasal cannula O2 at home.  He reports increased cough and shortness of breath for the last 2 to 3 days.  Home medications including breathing treatments have been ineffective in controlling his symptoms.  He denies fever.  He denies chest pain.  EMS has already administered 125 mg of Solu-Medrol.  Patient is completing a breathing treatment on arrival.  He reports some improvement during transport.  The history is provided by the patient.       Home Medications Prior to Admission medications   Medication Sig Start Date End Date Taking? Authorizing Provider  albuterol (VENTOLIN HFA) 108 (90 Base) MCG/ACT inhaler Inhale 2 puffs into the lungs 2 (two) times daily. 05/15/17   [provider]  BREZTRI AEROSPHERE 160-9-4.8 MCG/ACT AERO Inhale 1 puff into the lungs in the morning and at bedtime. 07/28/19   [provider]  esomeprazole (NEXIUM) 40 MG capsule Take 40 mg by mouth as needed.     [provider]  ipratropium-albuterol (DUONEB) 0.5-2.5 (3) MG/3ML SOLN Inhale 3 mLs into the lungs every 6 (six) hours as needed (shortness of breath). 10/16/22   Leslye Peer, MD  methadone (DOLOPHINE) 10 MG/ML solution Take 100 mg by mouth every morning.    [provider]  predniSONE (DELTASONE) 10 MG tablet 4 tabs for 3 days, then 3 tabs for 3 days, 2 tabs for 3 days, then 1 tab for 3 days, then stop 05/28/23   Cobb, Ruby Cola, NP  Roflumilast (DALIRESP) 250 MCG TABS Take 250 mcg by mouth daily. 04/05/22   Leslye Peer, MD  rosuvastatin (CRESTOR) 5 MG tablet Take 5 mg by  mouth daily. 04/14/23   [provider]      Allergies    Patient has no known allergies.    Review of Systems   Review of Systems  All other systems reviewed and are negative.   Physical Exam Updated Vital Signs There were no vitals taken for this visit. Physical Exam Vitals and nursing note reviewed.  Constitutional:      General: He is not in acute distress.    Appearance: Normal appearance. He is well-developed.  HENT:     Head: Normocephalic and atraumatic.  Eyes:     Conjunctiva/sclera: Conjunctivae normal.     Pupils: Pupils are equal, round, and reactive to light.  Cardiovascular:     Rate and Rhythm: Normal rate and regular rhythm.     Heart sounds: Normal heart sounds.  Pulmonary:     Effort: Pulmonary effort is normal. No respiratory distress.     Comments: Diffuse expiratory wheezes in all lung fields Abdominal:     General: There is no distension.     Palpations: Abdomen is soft.     Tenderness: There is no abdominal tenderness.  Musculoskeletal:        General: No deformity. Normal range of motion.     Cervical back: Normal range of motion and neck supple.  Skin:    General: Skin is warm and dry.  Neurological:  General: No focal deficit present.     Mental Status: He is alert and oriented to person, place, and time.     ED Results / Procedures / Treatments   Labs (all labs ordered are listed, but only abnormal results are displayed) Labs Reviewed - No data to display  EKG None  Radiology No results found.  Procedures Procedures    Medications Ordered in ED Medications - No data to display  ED Course/ Medical Decision Making/ A&P                                 Medical Decision Making Amount and/or Complexity of Data Reviewed Labs: ordered. Radiology: ordered.  Risk Prescription drug management.    Medical Screen Complete  This patient presented to the ED with complaint of shortness of breath.  This complaint  involves an extensive number of treatment options. The initial differential diagnosis includes, but is not limited to, COPD exacerbation, pneumonia, metabolic abnormality, etc.  This presentation is: Acute, Chronic, Self-Limited, Previously Undiagnosed, Uncertain Prognosis, Complicated, Systemic Symptoms, and Threat to Life/Bodily Function  Patient with longstanding history of COPD.  He presents with increased shortness of breath.  Patient with diffuse bronchospasm on initial exam.  EMS administered breathing treatments and 125 mg of Solu-Medrol prior to arrival.  Patient is on a baseline 3 L nasal cannula O2 at all times.  Workup does not reveal evidence of pneumonia or significant infection.  Notable findings include potassium of 2.9.  Creatinine 0.52.  Hemoglobin 11.7.  COVID and flu testing is negative.  Patient with continued dyspnea despite treatment.  He would benefit from admission for further workup and treatment.  Hospitalist service made aware of case.  Co morbidities that complicated the patient's evaluation  See HPI   Additional history obtained:  External records from outside sources obtained and reviewed including prior ED visits and prior Inpatient records.   Problem List / ED Course:  Dyspnea     Disposition:  After consideration of the diagnostic results and the patients response to treatment, I feel that the patent would benefit from admission.          Final Clinical Impression(s) / ED Diagnoses Final diagnoses:  Dyspnea, unspecified type    Rx / DC Orders ED Discharge Orders     None         Wynetta Fines, MD 06/13/23 1047

## 2023-06-13 NOTE — ED Triage Notes (Signed)
 Pt presents to ER via EMS for worsening SHOB x 2-3 days. Pt states he is getting over cold. EMS administered albuterol treatment and 125  IVP solumedrol. Pt is on 3 L [place] at baseline.

## 2023-06-13 NOTE — H&P (Addendum)
 History and Physical  Clarence Murray ZOX:[id number] DOB: 07/16/1965 DOA: 06/13/2023  PCP: Shelbie Ammons, MD   Chief Complaint: Worsening shortness of breath  HPI: Clarence Murray is a 58 y.o. male with medical history significant for GERD, severe COPD on 3 L nasal cannula chronically, history of opioid abuse on methadone, admitted to the hospital with recurrent dyspnea and admitted for acute exacerbation of COPD.  He was recently discharged on 05/11/2023 from [facility] after a stay for acute exacerbation of COPD.  Has been doing well, followed up in the interim with his pulmonology office.  However starting about 2 or 3 days ago, states that he started feeling rundown, generally achy tired like he had a cold.  No significant change in his cough, but experiencing a lot of dyspnea with any exertion.  Denies any chest pain, lower extremity edema, or orthopnea.  He wears 3 L basal nasal cannula, was given IV Solu-Medrol and breathing treatments by EMS during transport.  Felt better on arrival.  Workup in the ER unimpressive for any concerning acute process, hospitalist admission requested for acute exacerbation of COPD.  Review of Systems: Please see HPI for pertinent positives and negatives. A complete 10 system review of systems are otherwise negative.  Past Medical History:  Diagnosis Date   Chronic pain    work injury 02/ 2002 annuler tear C5-6 disk   COPD (chronic obstructive pulmonary disease) (HCC)    GERD (gastroesophageal reflux disease)    History of Rocky [place] spotted fever age 62   Left inguinal hernia    Opioid abuse, in remission (HCC)    PER PT IN REMISSION SINCE 2012 , BEEN ON METHADONE SINCE    OSA (obstructive sleep apnea)    per study 06-08-2014 moderate osa / hypopnea syndrome---  PER PT "TITRATED FOR CPAP BUT WAS TOLD DID NOT NEED IT IF HE DIDN'T WANT TO"   Past Surgical History:  Procedure Laterality Date   CARDIAC CATHETERIZATION  08-11-2006  dr cooper    Non-obstructive CAD-- minor plaque LAD and LCx/  Normal LVF , ef 65%   INGUINAL HERNIA REPAIR Left 10/02/2016   Procedure: LEFT OPEN INGUINAL HERNIA REPAIR WITH MESH;  Surgeon: Kinsinger, De Blanch, MD;  Location: [facility] [place];  Service: General;  Laterality: Left;   INSERTION OF MESH Left 10/02/2016   Procedure: INSERTION OF MESH;  Surgeon: Sheliah Hatch De Blanch, MD;  Location: [facility] [place];  Service: General;  Laterality: Left;   TRANSTHORACIC ECHOCARDIOGRAM  08/07/2006   good overall LVF and LVSF/ inadequate study for evaluation LV wall motion   UMBILICAL HERNIA REPAIR  09/2015   Social History:  reports that he quit smoking about 8 years ago. His smoking use included cigarettes. He started smoking about 23 years ago. He has a 30 pack-year smoking history. He has been exposed to tobacco smoke. He has never used smokeless tobacco. He reports that he does not drink alcohol and does not use drugs.  No Known Allergies  Family History  Problem Relation Age of Onset   Cancer Mother        Lung   Heart failure Maternal Grandmother    Heart attack Father        death at age 88 from MI   Heart attack Sister        death at age 61   COPD Brother      Prior to Admission medications   Medication Sig Start Date End  Date Taking? Authorizing Provider  albuterol (VENTOLIN HFA) 108 (90 Base) MCG/ACT inhaler Inhale 2 puffs into the lungs 2 (two) times daily. 05/15/17   [provider]  BREZTRI AEROSPHERE 160-9-4.8 MCG/ACT AERO Inhale 1 puff into the lungs in the morning and at bedtime. 07/28/19   [provider]  esomeprazole (NEXIUM) 40 MG capsule Take 40 mg by mouth as needed.     [provider]  ipratropium-albuterol (DUONEB) 0.5-2.5 (3) MG/3ML SOLN Inhale 3 mLs into the lungs every 6 (six) hours as needed (shortness of breath). 10/16/22   Leslye Peer, MD  methadone (DOLOPHINE) 10 MG/ML solution Take 100 mg by mouth every morning.    [provider]  predniSONE (DELTASONE) 10 MG tablet 4 tabs for 3 days, then 3 tabs for 3 days, 2 tabs for 3 days, then 1 tab for 3 days, then stop 05/28/23   Cobb, Ruby Cola, NP  Roflumilast (DALIRESP) 250 MCG TABS Take 250 mcg by mouth daily. 04/05/22   Leslye Peer, MD  rosuvastatin (CRESTOR) 5 MG tablet Take 5 mg by mouth daily. 04/14/23   [provider]    Physical Exam: BP 138/84   Pulse 99   Temp 98.3 F (36.8 C) (Oral)   Resp 13   Ht 6' (1.829 m)   Wt 81.6 kg   SpO2 98%   BMI 24.41 kg/m  General:  Alert, oriented, calm, in no acute distress, resting comfortably and saturating 100% on 3 L nasal cannula oxygen.  Speaking in full sentences with no cough and no evidence of respiratory distress. Cardiovascular: RRR, no murmurs or rubs, no peripheral edema  Respiratory: clear to auscultation bilaterally, no wheezes, no crackles, though breath sounds are very distant Abdomen: soft, nontender, nondistended, normal bowel tones heard  Skin: dry, no rashes  Musculoskeletal: no joint effusions, normal range of motion  Psychiatric: appropriate affect, normal speech  Neurologic: extraocular muscles intact, clear speech, moving all extremities with intact sensorium         Labs on Admission:  Basic Metabolic Panel: Recent Labs  Lab 06/13/23 0843  NA 134*  K 2.9*  CL 90*  CO2 33*  GLUCOSE 156*  BUN 17  CREATININE 0.52*  CALCIUM 8.5*   Liver Function Tests: Recent Labs  Lab 06/13/23 0843  AST 14*  ALT 10  ALKPHOS 87  BILITOT 1.1  PROT 7.2  ALBUMIN 3.4*   No results for input(s): "LIPASE", "AMYLASE" in the last 168 hours. No results for input(s): "AMMONIA" in the last 168 hours. CBC: Recent Labs  Lab 06/13/23 0843  WBC 8.1  NEUTROABS 6.1  HGB 11.7*  HCT 37.0*  MCV 95.1  PLT 355   Cardiac Enzymes: No results for input(s): "CKTOTAL", "CKMB", "CKMBINDEX", "TROPONINI" in the last 168 hours. BNP (last 3 results) No results for input(s): "BNP" in the last  8760 hours.  ProBNP (last 3 results) No results for input(s): "PROBNP" in the last 8760 hours.  CBG: No results for input(s): "GLUCAP" in the last 168 hours.  Radiological Exams on Admission: DG Chest Port 1 View Result Date: 06/13/2023 CLINICAL DATA:  Shortness of breath for several days. EXAM: PORTABLE CHEST 1 VIEW COMPARISON:  May 05, 2023. FINDINGS: The heart size and mediastinal contours are within normal limits. Minimal bibasilar subsegmental atelectasis is noted. The visualized skeletal structures are unremarkable. IMPRESSION: Minimal bibasilar subsegmental atelectasis. Electronically Signed   By: Lupita Raider M.D.   On: 06/13/2023 08:50   Assessment/Plan Clarence Fus  R Murray is a 58 y.o. male with medical history significant for GERD, severe COPD on 3 L nasal cannula chronically, history of opioid abuse on methadone, admitted to the hospital with recurrent dyspnea and admitted for acute exacerbation of COPD.  Acute exacerbation of COPD-with cough, dyspnea.  No evidence of acute infection respiratory viral panel negative.  After initial treatment, patient is stable and comfortable on his baseline home oxygen. -Observation admission -Continue supplemental oxygen, wean with goal O2 saturation greater than 89% -Incentive spirometer and flutter valve -IV Solu-Medrol 40 mg every 12 hours -Breztri twice daily, with as needed albuterol  GERD-Protonix  Hypokalemia-unclear etiology, patient denies any diarrhea or vomiting. -Check magnesium level -Replete potassium orally  Opioid dependancy-continue methadone  Hyperlipidemia-Crestor  DVT prophylaxis: Lovenox     Code Status: Full Code  Consults called: None  Admission status: Observation  Time spent: 49 minutes  Burnett Lieber Sharlette Dense MD [facility] Pager [phone]  If 7PM-7AM, please contact night-coverage [email or web address] Password [facility]  06/13/2023, 11:53 AM

## 2023-06-13 NOTE — ED Notes (Signed)
 ED TO INPATIENT HANDOFF REPORT  Name/Age/Gender Clarence Murray 58 y.o. male  Code Status    Code Status Orders  (From admission, onward)           Start     Ordered   06/13/23 1153  Full code  Continuous       Question:  By:  Answer:  Consent: discussion documented in EHR   06/13/23 1153           Code Status History     Date Active Date Inactive Code Status Order ID Comments User Context   05/06/2023 0758 05/11/2023 2050 Full Code [id number]  Lorin Glass, MD ED   03/16/2014 1923 03/19/2014 1815 Full Code [id number]  Onnie Boer, MD Inpatient       Home/SNF/Other Home  Chief Complaint COPD with acute exacerbation (HCC) [J44.1]  Level of Care/Admitting Diagnosis ED Disposition     ED Disposition  Admit   Condition  --   Comment  Hospital Area: [facility] [[id number]]  Level of Care: Med-Surg [16]  May place patient in observation at [facility] or [facility] if equivalent level of care is available:: Yes  Covid Evaluation: Confirmed COVID Negative  Diagnosis: COPD with acute exacerbation [facility]) [[id number]]  Admitting Physician: Maryln Gottron [[id number]]  Attending Physician: Olexa.Dam, MIR Jaxson.Roy [[id number]]          Medical History Past Medical History:  Diagnosis Date   Chronic pain    work injury 02/ 2002 annuler tear C5-6 disk   COPD (chronic obstructive pulmonary disease) (HCC)    GERD (gastroesophageal reflux disease)    History of Rocky Mountain spotted fever age 81   Left inguinal hernia    Opioid abuse, in remission (HCC)    PER PT IN REMISSION SINCE 2012 , BEEN ON METHADONE SINCE    OSA (obstructive sleep apnea)    per study 06-08-2014 moderate osa / hypopnea syndrome---  PER PT "TITRATED FOR CPAP BUT WAS TOLD DID NOT NEED IT IF HE DIDN'T WANT TO"    Allergies No Known Allergies  IV Location/Drains/Wounds Patient Lines/Drains/Airways Status     Active Line/Drains/Airways     Name Placement date Placement time  Site Days   Peripheral IV 06/13/23 20 G Left Antecubital 06/13/23  0811  Antecubital  less than 1            Labs/Imaging Results for orders placed or performed during the hospital encounter of 06/13/23 (from the past 48 hours)  Resp panel by RT-PCR (RSV, Flu A&B, Covid) Anterior Nasal Swab     Status: None   Collection Time: 06/13/23  8:43 AM   Specimen: Anterior Nasal Swab  Result Value Ref Range   SARS Coronavirus 2 by RT PCR NEGATIVE NEGATIVE    Comment: (NOTE) SARS-CoV-2 target nucleic acids are NOT DETECTED.  The SARS-CoV-2 RNA is generally detectable in upper respiratory specimens during the acute phase of infection. The lowest concentration of SARS-CoV-2 viral copies this assay can detect is 138 copies/mL. A negative result does not preclude SARS-Cov-2 infection and should not be used as the sole basis for treatment or other patient management decisions. A negative result may occur with  improper specimen collection/handling, submission of specimen other than nasopharyngeal swab, presence of viral mutation(s) within the areas targeted by this assay, and inadequate number of viral copies(<138 copies/mL). A negative result must be combined with clinical observations, patient history, and epidemiological information. The expected result is Negative.  Fact  Sheet for Patients:  BloggerCourse.com  Fact Sheet for Healthcare Providers:  SeriousBroker.it  This test is no t yet approved or cleared by the [place] FDA and  has been authorized for detection and/or diagnosis of SARS-CoV-2 by FDA under an Emergency Use Authorization (EUA). This EUA will remain  in effect (meaning this test can be used) for the duration of the COVID-19 declaration under Section 564(b)(1) of the Act, 21 U.S.C.section 360bbb-3(b)(1), unless the authorization is terminated  or revoked sooner.       Influenza A by PCR NEGATIVE NEGATIVE    Influenza B by PCR NEGATIVE NEGATIVE    Comment: (NOTE) The Xpert Xpress SARS-CoV-2/FLU/RSV plus assay is intended as an aid in the diagnosis of influenza from Nasopharyngeal swab specimens and should not be used as a sole basis for treatment. Nasal washings and aspirates are unacceptable for Xpert Xpress SARS-CoV-2/FLU/RSV testing.  Fact Sheet for Patients: BloggerCourse.com  Fact Sheet for Healthcare Providers: SeriousBroker.it  This test is not yet approved or cleared by the [place] FDA and has been authorized for detection and/or diagnosis of SARS-CoV-2 by FDA under an Emergency Use Authorization (EUA). This EUA will remain in effect (meaning this test can be used) for the duration of the COVID-19 declaration under Section 564(b)(1) of the Act, 21 U.S.C. section 360bbb-3(b)(1), unless the authorization is terminated or revoked.     Resp Syncytial Virus by PCR NEGATIVE NEGATIVE    Comment: (NOTE) Fact Sheet for Patients: BloggerCourse.com  Fact Sheet for Healthcare Providers: SeriousBroker.it  This test is not yet approved or cleared by the [place] FDA and has been authorized for detection and/or diagnosis of SARS-CoV-2 by FDA under an Emergency Use Authorization (EUA). This EUA will remain in effect (meaning this test can be used) for the duration of the COVID-19 declaration under Section 564(b)(1) of the Act, 21 U.S.C. section 360bbb-3(b)(1), unless the authorization is terminated or revoked.  Performed at [facility], [address] [place], [place] [address]   CBC with Differential     Status: Abnormal   Collection Time: 06/13/23  8:43 AM  Result Value Ref Range   WBC 8.1 4.0 - 10.5 K/uL   RBC 3.89 (L) 4.22 - 5.81 MIL/uL   Hemoglobin 11.7 (L) 13.0 - 17.0 g/dL   HCT 81.1 (L) 91.4 - 78.2 %   MCV 95.1 80.0 - 100.0 fL   MCH 30.1 26.0 -  34.0 pg   MCHC 31.6 30.0 - 36.0 g/dL   RDW 95.6 21.3 - 08.6 %   Platelets 355 150 - 400 K/uL   nRBC 0.0 0.0 - 0.2 %   Neutrophils Relative % 76 %   Neutro Abs 6.1 1.7 - 7.7 K/uL   Lymphocytes Relative 15 %   Lymphs Abs 1.3 0.7 - 4.0 K/uL   Monocytes Relative 7 %   Monocytes Absolute 0.6 0.1 - 1.0 K/uL   Eosinophils Relative 1 %   Eosinophils Absolute 0.1 0.0 - 0.5 K/uL   Basophils Relative 0 %   Basophils Absolute 0.0 0.0 - 0.1 K/uL   Immature Granulocytes 1 %   Abs Immature Granulocytes 0.05 0.00 - 0.07 K/uL    Comment: Performed at [facility], [address], [place], [place] [address]  Comprehensive metabolic panel     Status: Abnormal   Collection Time: 06/13/23  8:43 AM  Result Value Ref Range   Sodium 134 (L) 135 - 145 mmol/L   Potassium 2.9 (L) 3.5 -  5.1 mmol/L   Chloride 90 (L) 98 - 111 mmol/L   CO2 33 (H) 22 - 32 mmol/L   Glucose, Bld 156 (H) 70 - 99 mg/dL    Comment: Glucose reference range applies only to samples taken after fasting for at least 8 hours.   BUN 17 6 - 20 mg/dL   Creatinine, Ser 1.61 (L) 0.61 - 1.24 mg/dL   Calcium 8.5 (L) 8.9 - 10.3 mg/dL   Total Protein 7.2 6.5 - 8.1 g/dL   Albumin 3.4 (L) 3.5 - 5.0 g/dL   AST 14 (L) 15 - 41 U/L   ALT 10 0 - 44 U/L   Alkaline Phosphatase 87 38 - 126 U/L   Total Bilirubin 1.1 0.0 - 1.2 mg/dL   GFR, Estimated >09 >60 mL/min    Comment: (NOTE) Calculated using the CKD-EPI Creatinine Equation (2021)    Anion gap 11 5 - 15    Comment: Performed at [facility], [address] [place], [place] [address]   DG Chest Port 1 View Result Date: 06/13/2023 CLINICAL DATA:  Shortness of breath for several days. EXAM: PORTABLE CHEST 1 VIEW COMPARISON:  May 05, 2023. FINDINGS: The heart size and mediastinal contours are within normal limits. Minimal bibasilar subsegmental atelectasis is noted. The visualized skeletal structures are unremarkable. IMPRESSION: Minimal bibasilar subsegmental  atelectasis. Electronically Signed   By: Lupita Raider M.D.   On: 06/13/2023 08:50    Pending Labs Unresulted Labs (From admission, onward)     Start     Ordered   06/14/23 0500  Basic metabolic panel  Tomorrow morning,   R        06/13/23 1153   06/14/23 0500  CBC  Tomorrow morning,   R        06/13/23 1153   06/13/23 1150  Magnesium  Add-on,   AD        06/13/23 1149            Vitals/Pain Today's Vitals   06/13/23 1130 06/13/23 1145 06/13/23 1153 06/13/23 1200  BP: 130/81 138/84  131/87  Pulse: 90 99 99 97  Resp: 12 13 13 14   Temp:      TempSrc:      SpO2: 98% 98% 98% 98%  Weight:      Height:      PainSc:        Isolation Precautions No active isolations  Medications Medications  potassium chloride SA (KLOR-CON M) CR tablet 40 mEq (has no administration in time range)  methadone (DOLOPHINE) 10 MG/ML solution 100 mg (has no administration in time range)  rosuvastatin (CRESTOR) tablet 5 mg (has no administration in time range)  pantoprazole (PROTONIX) EC tablet 40 mg (has no administration in time range)  budeson-glycopyrrolate-formoterol (BREZTRI) 160-9-4.8 MCG/ACT inhaler 1 puff (has no administration in time range)  Roflumilast TABS 250 mcg (has no administration in time range)  enoxaparin (LOVENOX) injection 40 mg (has no administration in time range)  acetaminophen (TYLENOL) tablet 650 mg (has no administration in time range)    Or  acetaminophen (TYLENOL) suppository 650 mg (has no administration in time range)  traZODone (DESYREL) tablet 25 mg (has no administration in time range)  ondansetron (ZOFRAN) tablet 4 mg (has no administration in time range)    Or  ondansetron (ZOFRAN) injection 4 mg (has no administration in time range)  albuterol (PROVENTIL) (2.5 MG/3ML) 0.083% nebulizer solution 2.5 mg (has no administration in time range)  methylPREDNISolone sodium succinate (  SOLU-MEDROL) 40 mg/mL injection 40 mg (has no administration in time range)   potassium chloride SA (KLOR-CON M) CR tablet 40 mEq (40 mEq Oral Given 06/13/23 0933)  potassium chloride 10 mEq in 100 mL IVPB (0 mEq Intravenous Stopped 06/13/23 1100)  ipratropium-albuterol (DUONEB) 0.5-2.5 (3) MG/3ML nebulizer solution 3 mL (3 mLs Nebulization Given 06/13/23 1027)    Mobility walks with person assist

## 2023-06-13 NOTE — Progress Notes (Signed)
 Chaplains received a consult to assist Clarence Murray with advance directives paperwork.  I brought him the papers and he plans to look them over this weekend. I provided listening as well as emotional support as he shared about his wife's decline since her stroke.  He is her main caregiver and as a result does not sleep well at night because he is watching her to be sure she is still breathing. I encouraged him to ask her doctor if she might qualify for other supports so that he can also care for himself.  He plans to do so and was appreciative of the chance to share what has been so heavy on his heart.

## 2023-06-14 DIAGNOSIS — J441 Chronic obstructive pulmonary disease with (acute) exacerbation: Principal | ICD-10-CM

## 2023-06-14 LAB — CBC
HCT: 32.4 % — ABNORMAL LOW (ref 39.0–52.0)
Hemoglobin: 10.5 g/dL — ABNORMAL LOW (ref 13.0–17.0)
MCH: 30.3 pg (ref 26.0–34.0)
MCHC: 32.4 g/dL (ref 30.0–36.0)
MCV: 93.6 fL (ref 80.0–100.0)
Platelets: 336 10*3/uL (ref 150–400)
RBC: 3.46 MIL/uL — ABNORMAL LOW (ref 4.22–5.81)
RDW: 13.5 % (ref 11.5–15.5)
WBC: 10.7 10*3/uL — ABNORMAL HIGH (ref 4.0–10.5)
nRBC: 0 % (ref 0.0–0.2)

## 2023-06-14 LAB — BASIC METABOLIC PANEL WITH GFR
Anion gap: 8 (ref 5–15)
BUN: 16 mg/dL (ref 6–20)
CO2: 32 mmol/L (ref 22–32)
Calcium: 8.6 mg/dL — ABNORMAL LOW (ref 8.9–10.3)
Chloride: 94 mmol/L — ABNORMAL LOW (ref 98–111)
Creatinine, Ser: 0.57 mg/dL — ABNORMAL LOW (ref 0.61–1.24)
GFR, Estimated: >60 mL/min (ref >60)
Glucose, Bld: 145 mg/dL — ABNORMAL HIGH (ref 70–99)
Potassium: 3.9 mmol/L (ref 3.5–5.1)
Sodium: 134 mmol/L — ABNORMAL LOW (ref 135–145)

## 2023-06-14 MED ORDER — PREDNISONE 20 MG PO TABS
40.0000 mg | ORAL_TABLET | Freq: Every day | ORAL | Status: DC
  Administered 2023-06-15 – 2023-06-16 (×2): 40 mg via ORAL
  Filled 2023-06-14 (×2): qty 2

## 2023-06-14 NOTE — Progress Notes (Addendum)
 PROGRESS NOTE    MAR WALMER  ZOX:[id number] DOB: 1965-08-08 DOA: 06/13/2023 PCP: Georgean Kindle, MD    Brief Narrative:   Clarence Murray is a 58 y.o. male with past medical history significant for chronic hypoxic respiratory failure/COPD on 3 L nasal cannula at baseline, history of opioid abuse on methadone, HLD, GERD, former smoker, OSA not on CPAP who presented to [facility] ED on 06/13/2023 with shortness of breath.  Recently discharged from [facility] on 05/11/2023 for COPD exacerbation and recently followed up with his pulmonologist outpatient.  Starting 2-3 days ago, started feeling rundown, achy, with generalized weakness.  Denies chest pain, no lower extremity edema, no nausea/vomiting/diarrhea, no abdominal pain.  In the ED, temperature 98.3 F, HR 109, RR 24, BP 145/79, SpO2 92% on 3 L nasal cannula.  WBC 8.1, hemoglobin 11.7, platelet count 355.  Sodium 134, potassium 2.9, chloride 90, CO2 33, glucose 156, BUN 17, creatinine 0.52.  AST 14, ALT 10, total bilirubin 1.1.  COVID/influenza/RSV PCR negative.  Chest x-ray with minimal bibasilar subtle mental atelectasis.  Patient was given IV Solu-Medrol, neb treatments with continued symptoms.  TRH consulted for admission for further evaluation and management of acute COPD exacerbation.  Assessment & Plan:   Chronic hypoxic respiratory failure Acute on chronic COPD exacerbation Patient presenting with progressive shortness of breath, generalized weakness cold-like symptoms.  Patient is afebrile without leukocytosis. -- Breztri 2 puffs BID -- Solu-Medrol 40 mg IV every 12 hours; transition to prednisone 40 mg p.o. daily tomorrow morning -- Roflumilast 250 mcg p.o. daily -- Albuterol neb every 2 hours as needed wheezing/shortness of breath -- Continue supplemental oxygen, maintain SpO2 greater than 88%, currently on 5 L nasal cannula; attempt to wean back to 3 L which is his typical baseline  Hyponatremia Sodium 134,  likely secondary to poor oral intake in the days preceding hospitalization. -- Continue to encourage increased oral intake -- BMP in a.m.  Hypokalemia Potassium 2.9 at admission, repleted. -- Repeat electrolytes in the a.m.  Hyperlipidemia -- Crestor 5 mg p.o. daily  GERD -- Protonix 40 mg p.o. daily  History of tobacco abuse Continue to encourage cessation/abstinence.  Chronic opioid use -- Methadone 100 mg p.o. every morning  OSA Not on CPAP outpatient.  Continue follow-up with pulmonology.   DVT prophylaxis: enoxaparin (LOVENOX) injection 40 mg Start: 06/13/23 1200    Code Status: Full Code Family Communication: No family present at bedside this morning  Disposition Plan:  Level of care: Med-Surg Status is: Observation The patient remains OBS appropriate and will d/c before 2 midnights.    Consultants:  None  Procedures:  None  Antimicrobials:  None   Subjective: Patient seen examined bedside, lying in bed.  Reports breathing improved.  Remains on IV steroids.  Discussed if continues to improve anticipate likely will be able to discharge home tomorrow with prednisone taper.  No evidence poser questions, concerns or complaints at this time.  Denies headache, no dizziness, no chest pain, no palpitations, no fever/chills/night sweats, no nausea/vomiting/diarrhea, no abdominal pain, no focal weakness, no fatigue, no paresthesias.  No acute events overnight per nursing staff.  Objective: Vitals:   06/13/23 2001 06/13/23 2048 06/14/23 0116 06/14/23 0438  BP:  109/70 104/67 108/74  Pulse:  96 87 82  Resp:  18 16 18   Temp:  98.2 F (36.8 C) 97.8 F (36.6 C) 97.9 F (36.6 C)  TempSrc:  Oral  Oral  SpO2: 95% 97% 98% 99%  Weight:      Height:        Intake/Output Summary (Last 24 hours) at 06/14/2023 1034 Last data filed at 06/13/2023 1700 Gross per 24 hour  Intake 97.98 ml  Output 200 ml  Net -102.02 ml   Filed Weights   06/13/23 0826  Weight: 81.6 kg     Examination:  Physical Exam: GEN: NAD, alert and oriented x 3 HEENT: NCAT, PERRL, EOMI, sclera clear, MMM PULM: Late expiratory wheezing bilateral upper lung fields, no crackles, normal respiratory effort without accessory muscle use, on 5 L nasal cannula (3L baseline) CV: RRR w/o M/G/R GI: abd soft, NTND, NABS, no R/G/M MSK: no peripheral edema, muscle strength globally intact 5/5 bilateral upper/lower extremities NEURO: CN II-XII intact, no focal deficits, sensation to light touch intact PSYCH: normal mood/affect Integumentary: dry/intact, no rashes or wounds    Data Reviewed: I have personally reviewed following labs and imaging studies  CBC: Recent Labs  Lab 06/13/23 0843 06/14/23 0447  WBC 8.1 10.7*  NEUTROABS 6.1  --   HGB 11.7* 10.5*  HCT 37.0* 32.4*  MCV 95.1 93.6  PLT 355 336   Basic Metabolic Panel: Recent Labs  Lab 06/13/23 0843 06/14/23 0447  NA 134* 134*  K 2.9* 3.9  CL 90* 94*  CO2 33* 32  GLUCOSE 156* 145*  BUN 17 16  CREATININE 0.52* 0.57*  CALCIUM 8.5* 8.6*  MG 2.0  --    GFR: Estimated Creatinine Clearance: 111.8 mL/min (A) (by C-G formula based on SCr of 0.57 mg/dL (L)). Liver Function Tests: Recent Labs  Lab 06/13/23 0843  AST 14*  ALT 10  ALKPHOS 87  BILITOT 1.1  PROT 7.2  ALBUMIN 3.4*   No results for input(s): "LIPASE", "AMYLASE" in the last 168 hours. No results for input(s): "AMMONIA" in the last 168 hours. Coagulation Profile: No results for input(s): "INR", "PROTIME" in the last 168 hours. Cardiac Enzymes: No results for input(s): "CKTOTAL", "CKMB", "CKMBINDEX", "TROPONINI" in the last 168 hours. BNP (last 3 results) No results for input(s): "PROBNP" in the last 8760 hours. HbA1C: No results for input(s): "HGBA1C" in the last 72 hours. CBG: No results for input(s): "GLUCAP" in the last 168 hours. Lipid Profile: No results for input(s): "CHOL", "HDL", "LDLCALC", "TRIG", "CHOLHDL", "LDLDIRECT" in the last 72  hours. Thyroid Function Tests: No results for input(s): "TSH", "T4TOTAL", "FREET4", "T3FREE", "THYROIDAB" in the last 72 hours. Anemia Panel: No results for input(s): "VITAMINB12", "FOLATE", "FERRITIN", "TIBC", "IRON", "RETICCTPCT" in the last 72 hours. Sepsis Labs: No results for input(s): "PROCALCITON", "LATICACIDVEN" in the last 168 hours.  Recent Results (from the past 240 hours)  Resp panel by RT-PCR (RSV, Flu A&B, Covid) Anterior Nasal Swab     Status: None   Collection Time: 06/13/23  8:43 AM   Specimen: Anterior Nasal Swab  Result Value Ref Range Status   SARS Coronavirus 2 by RT PCR NEGATIVE NEGATIVE Final    Comment: (NOTE) SARS-CoV-2 target nucleic acids are NOT DETECTED.  The SARS-CoV-2 RNA is generally detectable in upper respiratory specimens during the acute phase of infection. The lowest concentration of SARS-CoV-2 viral copies this assay can detect is 138 copies/mL. A negative result does not preclude SARS-Cov-2 infection and should not be used as the sole basis for treatment or other patient management decisions. A negative result may occur with  improper specimen collection/handling, submission of specimen other than nasopharyngeal swab, presence of viral mutation(s) within the areas targeted by this assay, and inadequate number  of viral copies(<138 copies/mL). A negative result must be combined with clinical observations, patient history, and epidemiological information. The expected result is Negative.  Fact Sheet for Patients:  BloggerCourse.com  Fact Sheet for Healthcare Providers:  SeriousBroker.it  This test is no t yet approved or cleared by the [place]  FDA and  has been authorized for detection and/or diagnosis of SARS-CoV-2 by FDA under an Emergency Use Authorization (EUA). This EUA will remain  in effect (meaning this test can be used) for the duration of the COVID-19 declaration under Section  564(b)(1) of the Act, 21 U.S.C.section 360bbb-3(b)(1), unless the authorization is terminated  or revoked sooner.       Influenza A by PCR NEGATIVE NEGATIVE Final   Influenza B by PCR NEGATIVE NEGATIVE Final    Comment: (NOTE) The Xpert Xpress SARS-CoV-2/FLU/RSV plus assay is intended as an aid in the diagnosis of influenza from Nasopharyngeal swab specimens and should not be used as a sole basis for treatment. Nasal washings and aspirates are unacceptable for Xpert Xpress SARS-CoV-2/FLU/RSV testing.  Fact Sheet for Patients: BloggerCourse.com  Fact Sheet for Healthcare Providers: SeriousBroker.it  This test is not yet approved or cleared by the [place]  FDA and has been authorized for detection and/or diagnosis of SARS-CoV-2 by FDA under an Emergency Use Authorization (EUA). This EUA will remain in effect (meaning this test can be used) for the duration of the COVID-19 declaration under Section 564(b)(1) of the Act, 21 U.S.C. section 360bbb-3(b)(1), unless the authorization is terminated or revoked.     Resp Syncytial Virus by PCR NEGATIVE NEGATIVE Final    Comment: (NOTE) Fact Sheet for Patients: BloggerCourse.com  Fact Sheet for Healthcare Providers: SeriousBroker.it  This test is not yet approved or cleared by the [place]  FDA and has been authorized for detection and/or diagnosis of SARS-CoV-2 by FDA under an Emergency Use Authorization (EUA). This EUA will remain in effect (meaning this test can be used) for the duration of the COVID-19 declaration under Section 564(b)(1) of the Act, 21 U.S.C. section 360bbb-3(b)(1), unless the authorization is terminated or revoked.  Performed at [facility], [address], [place], [place] [address]          Radiology Studies: [facility] Chest Port 1 View Result Date: 06/13/2023 CLINICAL DATA:   Shortness of breath for several days. EXAM: PORTABLE CHEST 1 VIEW COMPARISON:  May 05, 2023. FINDINGS: The heart size and mediastinal contours are within normal limits. Minimal bibasilar subsegmental atelectasis is noted. The visualized skeletal structures are unremarkable. IMPRESSION: Minimal bibasilar subsegmental atelectasis. Electronically Signed   By: Rosalene Colon M.D.   On: 06/13/2023 08:50        Scheduled Meds:  budeson-glycopyrrolate-formoterol  1 puff Inhalation BID   enoxaparin (LOVENOX) injection  40 mg Subcutaneous Q24H   methadone  100 mg Oral q morning   methylPREDNISolone (SOLU-MEDROL) injection  40 mg Intravenous Q12H   pantoprazole  40 mg Oral Daily   roflumilast  250 mcg Oral Daily   rosuvastatin  5 mg Oral Daily   Continuous Infusions:   LOS: 0 days    Time spent: 52 minutes spent on 06/14/2023 caring for this patient face-to-face including chart review, ordering labs/tests, documenting, discussion with nursing staff, consultants, updating family and interview/physical exam    Rema Care [place], DO [facility] Available via Epic secure chat 7am-7pm After these hours, please refer to coverage provider listed on [email or web address] 06/14/2023, 10:34 AM

## 2023-06-14 NOTE — Plan of Care (Signed)

## 2023-06-14 NOTE — Care Management Obs Status (Signed)
 MEDICARE OBSERVATION STATUS NOTIFICATION   Patient Details  Name: Clarence Murray MRN: [id number] Date of Birth: October 22, 1965   Medicare Observation Status Notification Given:  Yes    Isamar Nazir Liane Redman, LCSW 06/14/2023, 9:56 AM

## 2023-06-15 DIAGNOSIS — J441 Chronic obstructive pulmonary disease with (acute) exacerbation: Secondary | ICD-10-CM | POA: Diagnosis not present

## 2023-06-15 LAB — BASIC METABOLIC PANEL WITH GFR
Anion gap: 7 (ref 5–15)
BUN: 28 mg/dL — ABNORMAL HIGH (ref 6–20)
CO2: 32 mmol/L (ref 22–32)
Calcium: 8.8 mg/dL — ABNORMAL LOW (ref 8.9–10.3)
Chloride: 96 mmol/L — ABNORMAL LOW (ref 98–111)
Creatinine, Ser: 0.76 mg/dL (ref 0.61–1.24)
GFR, Estimated: >60 mL/min (ref >60)
Glucose, Bld: 123 mg/dL — ABNORMAL HIGH (ref 70–99)
Potassium: 4.2 mmol/L (ref 3.5–5.1)
Sodium: 135 mmol/L (ref 135–145)

## 2023-06-15 LAB — MAGNESIUM: Magnesium: 1.9 mg/dL (ref 1.7–2.4)

## 2023-06-15 LAB — CBC
HCT: 34.1 % — ABNORMAL LOW (ref 39.0–52.0)
Hemoglobin: 10.7 g/dL — ABNORMAL LOW (ref 13.0–17.0)
MCH: 30.2 pg (ref 26.0–34.0)
MCHC: 31.4 g/dL (ref 30.0–36.0)
MCV: 96.3 fL (ref 80.0–100.0)
Platelets: 347 10*3/uL (ref 150–400)
RBC: 3.54 MIL/uL — ABNORMAL LOW (ref 4.22–5.81)
RDW: 13.5 % (ref 11.5–15.5)
WBC: 16.2 10*3/uL — ABNORMAL HIGH (ref 4.0–10.5)
nRBC: 0 % (ref 0.0–0.2)

## 2023-06-15 MED ORDER — NICOTINE 14 MG/24HR TD PT24
14.0000 mg | MEDICATED_PATCH | Freq: Every day | TRANSDERMAL | Status: DC
  Filled 2023-06-15: qty 1

## 2023-06-15 NOTE — Plan of Care (Signed)

## 2023-06-15 NOTE — Hospital Course (Signed)
 57yo with h/o COPD on 3L home O2, opiate use d/o on methadone, HLD, and OSA not on CPAP who presented on 4/11 with SOB.  He was diagnosed with COPD exacerbation and started on IV -> PO steroids.

## 2023-06-15 NOTE — Progress Notes (Signed)
 Progress Note   Patient: Clarence Murray RUE:[id number] DOB: [birth year] DOA: 06/13/2023     0 DOS: the patient was seen and examined on 06/15/2023   Brief hospital course: 58yo with h/o COPD on 3L home O2, opiate use d/o on methadone, HLD, and OSA not on CPAP who presented on 4/11 with SOB.  He was diagnosed with COPD exacerbation and started on IV -> PO steroids.  Assessment and Plan:   Acute on chronic COPD exacerbation Patient presenting with progressive shortness of breath, afebrile, likely COPD exacerbation He is on 3L home O2 and on his home requirement Continue Breztri 2 puffs BID Solu-Medrol -> prednisone  Roflumilast 250 mcg p.o. daily Albuterol neb every 2 hours as needed wheezing/shortness of breath Anticipate dc to home in AM  Hyponatremia Likely secondary to poor oral intake in the days preceding hospitalization Resolved   Hypokalemia Repleted, resolved   Hyperlipidemia Continue rosuvastatin   GERD Continue Protonix   History of tobacco abuse Continue to encourage cessation/abstinence Nicotine patch ordered   Chronic opioid use Continue Methadone 100 mg p.o. every morning   OSA Not on CPAP outpatient Continue follow-up with pulmonology         Consultants: TOC team   Procedures: None   Antibiotics: None  30 Day Unplanned Readmission Risk Score    Flowsheet Row ED to Hosp-Admission (Discharged) from 05/05/2023 in [facility]  30 Day Unplanned Readmission Risk Score (%) 14.57 Filed at 05/11/2023 1200       This score is the patient's risk of an unplanned readmission within 30 days of being discharged (0 -100%). The score is based on dignosis, age, lab data, medications, orders, and past utilization.   Low:  0-14.9   Medium: 15-21.9   High: 22-29.9   Extreme: 30 and above           Subjective: Feeling better but thinks he needs one more day in the hospital.   Objective: Vitals:   06/15/23 0341 06/15/23 1201  BP:  100/65 118/73  Pulse: 80 97  Resp: 15 20  Temp: 98.6 F (37 C) 98.6 F (37 C)  SpO2: 97% 92%    Intake/Output Summary (Last 24 hours) at 06/15/2023 1828 Last data filed at 06/15/2023 1500 Gross per 24 hour  Intake 720 ml  Output --  Net 720 ml   Filed Weights   06/13/23 0826  Weight: 81.6 kg    Exam:  General:  Appears calm and comfortable and is in NAD, on home O2 Eyes:  EOMI, normal lids, iris ENT:  grossly normal hearing, lips & tongue, mmm Neck:  no LAD, masses or thyromegaly Cardiovascular:  RRR, no m/r/g. No LE edema.  Respiratory:   CTA bilaterally with no wheezes/rales/rhonchi.  Normal respiratory effort. Abdomen:  soft, NT, ND Skin:  no rash or induration seen on limited exam Musculoskeletal:  grossly normal tone BUE/BLE, good ROM, no bony abnormality Psychiatric:  grossly normal mood and affect, speech fluent and appropriate, AOx3 Neurologic:  CN 2-12 grossly intact, moves all extremities in coordinated fashion   Data Reviewed: I have reviewed the patient's lab results since admission.  Pertinent labs for today include:   Glucose 123 BUN 28/Creatinine 0.76/GFR >60 WBC 16.2 Hgb 10.7     Family Communication: None present  Disposition: Status is: Observation The patient remains OBS appropriate and will d/c before 2 midnights.     Time spent: 35 minutes  Unresulted Labs (From admission, onward)  Start     Ordered   06/16/23 0500  CBC with Differential/Platelet  Tomorrow morning,   R        06/15/23 1828   06/16/23 0500  Basic metabolic panel with GFR  Tomorrow morning,   R        06/15/23 1828             Author: Lorita Rosa, MD 06/15/2023 6:28 PM  For on call review [email or web address].

## 2023-06-16 DIAGNOSIS — K219 Gastro-esophageal reflux disease without esophagitis: Secondary | ICD-10-CM | POA: Diagnosis present

## 2023-06-16 DIAGNOSIS — F4320 Adjustment disorder, unspecified: Secondary | ICD-10-CM | POA: Diagnosis present

## 2023-06-16 DIAGNOSIS — E785 Hyperlipidemia, unspecified: Secondary | ICD-10-CM | POA: Diagnosis present

## 2023-06-16 DIAGNOSIS — J441 Chronic obstructive pulmonary disease with (acute) exacerbation: Secondary | ICD-10-CM | POA: Diagnosis not present

## 2023-06-16 LAB — CBC WITH DIFFERENTIAL/PLATELET
Abs Immature Granulocytes: 0.09 10*3/uL — ABNORMAL HIGH (ref 0.00–0.07)
Basophils Absolute: 0 10*3/uL (ref 0.0–0.1)
Basophils Relative: 0 %
Eosinophils Absolute: 0 10*3/uL (ref 0.0–0.5)
Eosinophils Relative: 0 %
HCT: 33.8 % — ABNORMAL LOW (ref 39.0–52.0)
Hemoglobin: 10.5 g/dL — ABNORMAL LOW (ref 13.0–17.0)
Immature Granulocytes: 1 %
Lymphocytes Relative: 18 %
Lymphs Abs: 2 10*3/uL (ref 0.7–4.0)
MCH: 30 pg (ref 26.0–34.0)
MCHC: 31.1 g/dL (ref 30.0–36.0)
MCV: 96.6 fL (ref 80.0–100.0)
Monocytes Absolute: 0.9 10*3/uL (ref 0.1–1.0)
Monocytes Relative: 8 %
Neutro Abs: 8.3 10*3/uL — ABNORMAL HIGH (ref 1.7–7.7)
Neutrophils Relative %: 73 %
Platelets: 316 10*3/uL (ref 150–400)
RBC: 3.5 MIL/uL — ABNORMAL LOW (ref 4.22–5.81)
RDW: 13.8 % (ref 11.5–15.5)
WBC: 11.4 10*3/uL — ABNORMAL HIGH (ref 4.0–10.5)
nRBC: 0 % (ref 0.0–0.2)

## 2023-06-16 LAB — BASIC METABOLIC PANEL WITH GFR
Anion gap: 8 (ref 5–15)
BUN: 26 mg/dL — ABNORMAL HIGH (ref 6–20)
CO2: 32 mmol/L (ref 22–32)
Calcium: 8.6 mg/dL — ABNORMAL LOW (ref 8.9–10.3)
Chloride: 95 mmol/L — ABNORMAL LOW (ref 98–111)
Creatinine, Ser: 0.59 mg/dL — ABNORMAL LOW (ref 0.61–1.24)
GFR, Estimated: >60 mL/min (ref >60)
Glucose, Bld: 100 mg/dL — ABNORMAL HIGH (ref 70–99)
Potassium: 3.8 mmol/L (ref 3.5–5.1)
Sodium: 135 mmol/L (ref 135–145)

## 2023-06-16 MED ORDER — PREDNISONE 20 MG PO TABS: ORAL_TABLET | ORAL | 0 refills | Status: AC

## 2023-06-16 MED ORDER — BUSPIRONE HCL 5 MG PO TABS: 5.0000 mg | ORAL_TABLET | Freq: Three times a day (TID) | ORAL | 0 refills | Status: AC | PRN

## 2023-06-16 NOTE — Plan of Care (Signed)

## 2023-06-16 NOTE — TOC Initial Note (Addendum)
 Transition of Care [facility]) - Initial/Assessment Note    Patient Details  Name: Clarence Murray MRN: [id number] Date of Birth: 11/27/1965  [facility]) CM/SW Contact:    Kathryn Vanbenschoten, RN Phone Number: 06/16/2023, 11:43 AM  Clinical Narrative:                 Presented for SOB form home where he lives with mother in-law and spouse. Verified PCP (appt on Friday) and insurance. Patient staes independent and drives self to appointment. DME ([employer]), oxygen at 3 liters with [employer]. No HH. Patients mother in-law will transport home at discharge via private vehicle. TOC will follow for oxygen needs.  Expected Discharge Plan: Home/Self Care Barriers to Discharge: Continued Medical Work up   Patient Goals and CMS Choice Patient states their goals for this hospitalization and ongoing recovery are:: Return home          Expected Discharge Plan and Services   Discharge Planning Services: CM Consult Post Acute Care Choice: Durable Medical Equipment Living arrangements for the past 2 months: Single Family Home Expected Discharge Date: 06/16/23                 DME Agency: [facility] (Oxygen)       HH Arranged: NA HH Agency: NA        Prior Living Arrangements/Services Living arrangements for the past 2 months: Single Family Home Lives with:: Spouse, Relatives Patient language and need for interpreter reviewed:: Yes Do you feel safe going back to the place where you live?: Yes      Need for Family Participation in Patient Care: No (Comment) Care giver support system in place?: Yes (comment) Current home services: DME Criminal Activity/Legal Involvement Pertinent to Current Situation/Hospitalization: No - Comment as needed  Activities of Daily Living   ADL Screening (condition at time of admission) Independently performs ADLs?: Yes (appropriate for developmental age) Is the patient deaf or have difficulty hearing?: No Does the patient have difficulty seeing, even  when wearing glasses/contacts?: Yes Does the patient have difficulty concentrating, remembering, or making decisions?: No  Permission Sought/Granted Permission sought to share information with : Case Manager Permission granted to share information with : Yes, Verbal Permission Granted              Emotional Assessment Appearance:: Appears older than stated age Attitude/Demeanor/Rapport: Engaged Affect (typically observed): Accepting, Appropriate Orientation: : Oriented to Self, Oriented to Place, Oriented to  Time, Oriented to Situation Alcohol / Substance Use: Not Applicable Psych Involvement: No (comment)  Admission diagnosis:  COPD with acute exacerbation (HCC) [J44.1] Dyspnea, unspecified type [R06.00] Patient Active Problem List   Diagnosis Date Noted   Dyslipidemia 06/16/2023   GERD without esophagitis 06/16/2023   Adjustment disorder 06/16/2023   COPD with acute exacerbation (HCC) 06/13/2023   Bronchiolitis 05/28/2023   Lymphadenopathy 05/28/2023   COPD exacerbation (HCC) 05/06/2023   SOB (shortness of breath) 09/15/2019   OSA (obstructive sleep apnea) 07/30/2019   Chronic respiratory failure with hypoxia (HCC) 07/30/2019   Chronic pain 03/17/2014   COPD (chronic obstructive pulmonary disease) (HCC) 03/16/2014   PCP:  Georgean Kindle, MD Pharmacy:   [facility] [phone] [place], [place] - [address] AT [facility] [address] [place] [address] Phone: [phone] Fax: [phone]     Social Drivers of Health (SDOH) Social History: SDOH Screenings   Food Insecurity: No Food Insecurity (06/13/2023)  Housing: Low  Risk  (06/13/2023)  Transportation Needs: No Transportation Needs (06/13/2023)  Utilities: Not At Risk (06/13/2023)  Tobacco Use: Medium Risk (06/13/2023)   SDOH Interventions:     Readmission Risk Interventions     No data to display

## 2023-06-16 NOTE — Discharge Summary (Signed)
 Physician Discharge Summary   Patient: Clarence Murray MRN: [id number] DOB: Mar 21, 1965  Admit date:     06/13/2023  Discharge date: 06/16/23  Discharge Physician: Jonah Blue   PCP: Shelbie Ammons, MD   Recommendations at discharge:   Continue steroids, prednisone taper from 40 mg and decreasing by 10 mg every 5 days You are being prescribed buspirone to take as needed for anxiety Follow up with Dr. Delton Coombes; call for an appointment Follow up with Dr. Ardelle Park in 1-2 weeks; call for an appointment  Discharge Diagnoses: Principal Problem:   COPD with acute exacerbation [facility]) Active Problems:   Chronic pain   OSA (obstructive sleep apnea)   Dyslipidemia   GERD without esophagitis   Adjustment disorder    Hospital Course: 57yo with h/o COPD on 3L home O2, opiate use d/o on methadone, HLD, and OSA not on CPAP who presented on 4/11 with SOB.  He was diagnosed with COPD exacerbation and started on IV -> PO steroids.  Assessment and Plan:  Acute on chronic COPD exacerbation Patient presenting with progressive shortness of breath, afebrile, likely COPD exacerbation He is on 3L home O2 and currently on his home requirement Continue Breztri 2 puffs BID Solu-Medrol -> prednisone  Roflumilast 250 mcg p.o. daily Albuterol neb every 2 hours as needed wheezing/shortness of breath DC to home today F/u with Dr. Delton Coombes - he is struggling with diminished ability to perform ADLs and may benefit from outpatient referral to palliative care   Hyperlipidemia Continue rosuvastatin   GERD Continue Protonix   History of tobacco abuse Quit smoking 1-2 years ago, praise provided   Chronic opioid use/chronic pain Continue Methadone 100 mg p.o. every morning   OSA Not on CPAP outpatient Continue follow-up with pulmonology  Adjustment d/o He is struggling with diminished ability to exert himself He is experiencing anxiety associated with diminished physical capacity, dyspnea, and emotional  coping Will add prn buspirone F/u with Dr. Delton Coombes and PCP to discuss anxiety, GOC, code status, possible palliative care referral        Consultants: TOC team   Procedures: None   Antibiotics: None  Pain control - [facility] Controlled Substance Reporting System database was reviewed. and patient was instructed, not to drive, operate heavy machinery, perform activities at heights, swimming or participation in water activities or provide baby-sitting services while on Pain, Sleep and Anxiety Medications; until their outpatient Physician has advised to do so again. Also recommended to not to take more than prescribed Pain, Sleep and Anxiety Medications.    Disposition: Home Diet recommendation:  Regular diet DISCHARGE MEDICATION: Allergies as of 06/16/2023   No Known Allergies      Medication List     TAKE these medications    albuterol 108 (90 Base) MCG/ACT inhaler Commonly known as: VENTOLIN HFA Inhale 2 puffs into the lungs 3 (three) times daily as needed for wheezing or shortness of breath.   Aleve 220 MG tablet Generic drug: naproxen sodium Take 220-440 mg by mouth 2 (two) times daily as needed (for pain or headaches).   Breztri Aerosphere 160-9-4.8 MCG/ACT Aero inhaler Generic drug: budeson-glycopyrrolate-formoterol Inhale 2 puffs into the lungs in the morning and at bedtime.   busPIRone 5 MG tablet Commonly known as: BUSPAR Take 1 tablet (5 mg total) by mouth 3 (three) times daily as needed (anxiety).   esomeprazole 40 MG capsule Commonly known as: NEXIUM Take 40 mg by mouth 2 (two) times daily before a meal.   ibuprofen 200  MG tablet Commonly known as: ADVIL Take 200-400 mg by mouth every 6 (six) hours as needed for headache (or pain).   ipratropium-albuterol 0.5-2.5 (3) MG/3ML Soln Commonly known as: DUONEB Inhale 3 mLs into the lungs every 6 (six) hours as needed (shortness of breath). What changed:  how much to take reasons to take this    methadone 10 MG/ML solution Commonly known as: DOLOPHINE Take 100 mg by mouth every morning.   OXYGEN Inhale 3.5-4 L/min into the lungs continuous.   predniSONE 20 MG tablet Commonly known as: DELTASONE Take 2 tablets (40 mg total) by mouth daily with breakfast for 5 days, THEN 1.5 tablets (30 mg total) daily with breakfast for 5 days, THEN 1 tablet (20 mg total) daily with breakfast for 5 days, THEN 0.5 tablets (10 mg total) daily with breakfast for 5 days. Start taking on: June 16, 2023 What changed:  medication strength See the new instructions.   Roflumilast 250 MCG Tabs Commonly known as: Daliresp Take 250 mcg by mouth daily.   rosuvastatin 5 MG tablet Commonly known as: CRESTOR Take 5 mg by mouth daily.   TYLENOL 500 MG tablet Generic drug: acetaminophen Take 500-1,000 mg by mouth every 6 (six) hours as needed for headache (or pain).        Discharge Exam:    Subjective: Sad this AM - frustrated with diminished ability to exert himself, overall frustrated.   Objective: Vitals:   06/15/23 1956 06/16/23 0419  BP: 104/77 (!) 96/57  Pulse: 77 84  Resp: 18 18  Temp: 97.8 F (36.6 C) 98.2 F (36.8 C)  SpO2: 96% 96%    Intake/Output Summary (Last 24 hours) at 06/16/2023 0951 Last data filed at 06/15/2023 1500 Gross per 24 hour  Intake 480 ml  Output --  Net 480 ml   Filed Weights   06/13/23 0826  Weight: 81.6 kg    Exam:  General:  Appears calm and comfortable and is in NAD, on home O2 Eyes:  EOMI, normal lids, iris ENT:  grossly normal hearing, lips & tongue, mmm Neck:  no LAD, masses or thyromegaly Cardiovascular:  RRR, no m/r/g. No LE edema.  Respiratory:   Scattered wheezes and rhonchi with reasonable air movement.  Normal respiratory effort. Abdomen:  soft, NT, ND Skin:  no rash or induration seen on limited exam Musculoskeletal:  grossly normal tone BUE/BLE, good ROM, no bony abnormality Psychiatric:  grossly normal mood and affect,  speech fluent and appropriate, AOx3 Neurologic:  CN 2-12 grossly intact, moves all extremities in coordinated fashion  Data Reviewed: I have reviewed the patient's lab results since admission.  Pertinent labs for today include:   Stable BMP WBC 11.4 Hgb 10.5, stable    Condition at discharge: improving  The results of significant diagnostics from this hospitalization (including imaging, microbiology, ancillary and laboratory) are listed below for reference.   Imaging Studies: DG Chest Port 1 View Result Date: 06/13/2023 CLINICAL DATA:  Shortness of breath for several days. EXAM: PORTABLE CHEST 1 VIEW COMPARISON:  May 05, 2023. FINDINGS: The heart size and mediastinal contours are within normal limits. Minimal bibasilar subsegmental atelectasis is noted. The visualized skeletal structures are unremarkable. IMPRESSION: Minimal bibasilar subsegmental atelectasis. Electronically Signed   By: Rosalene Colon M.D.   On: 06/13/2023 08:50    Microbiology: Results for orders placed or performed during the hospital encounter of 06/13/23  Resp panel by RT-PCR (RSV, Flu A&B, Covid) Anterior Nasal Swab     Status:  None   Collection Time: 06/13/23  8:43 AM   Specimen: Anterior Nasal Swab  Result Value Ref Range Status   SARS Coronavirus 2 by RT PCR NEGATIVE NEGATIVE Final    Comment: (NOTE) SARS-CoV-2 target nucleic acids are NOT DETECTED.  The SARS-CoV-2 RNA is generally detectable in upper respiratory specimens during the acute phase of infection. The lowest concentration of SARS-CoV-2 viral copies this assay can detect is 138 copies/mL. A negative result does not preclude SARS-Cov-2 infection and should not be used as the sole basis for treatment or other patient management decisions. A negative result may occur with  improper specimen collection/handling, submission of specimen other than nasopharyngeal swab, presence of viral mutation(s) within the areas targeted by this assay, and  inadequate number of viral copies(<138 copies/mL). A negative result must be combined with clinical observations, patient history, and epidemiological information. The expected result is Negative.  Fact Sheet for Patients:  BloggerCourse.com  Fact Sheet for Healthcare Providers:  SeriousBroker.it  This test is no t yet approved or cleared by the [place]  FDA and  has been authorized for detection and/or diagnosis of SARS-CoV-2 by FDA under an Emergency Use Authorization (EUA). This EUA will remain  in effect (meaning this test can be used) for the duration of the COVID-19 declaration under Section 564(b)(1) of the Act, 21 U.S.C.section 360bbb-3(b)(1), unless the authorization is terminated  or revoked sooner.       Influenza A by PCR NEGATIVE NEGATIVE Final   Influenza B by PCR NEGATIVE NEGATIVE Final    Comment: (NOTE) The Xpert Xpress SARS-CoV-2/FLU/RSV plus assay is intended as an aid in the diagnosis of influenza from Nasopharyngeal swab specimens and should not be used as a sole basis for treatment. Nasal washings and aspirates are unacceptable for Xpert Xpress SARS-CoV-2/FLU/RSV testing.  Fact Sheet for Patients: BloggerCourse.com  Fact Sheet for Healthcare Providers: SeriousBroker.it  This test is not yet approved or cleared by the [place]  FDA and has been authorized for detection and/or diagnosis of SARS-CoV-2 by FDA under an Emergency Use Authorization (EUA). This EUA will remain in effect (meaning this test can be used) for the duration of the COVID-19 declaration under Section 564(b)(1) of the Act, 21 U.S.C. section 360bbb-3(b)(1), unless the authorization is terminated or revoked.     Resp Syncytial Virus by PCR NEGATIVE NEGATIVE Final    Comment: (NOTE) Fact Sheet for Patients: BloggerCourse.com  Fact Sheet for Healthcare  Providers: SeriousBroker.it  This test is not yet approved or cleared by the [place]  FDA and has been authorized for detection and/or diagnosis of SARS-CoV-2 by FDA under an Emergency Use Authorization (EUA). This EUA will remain in effect (meaning this test can be used) for the duration of the COVID-19 declaration under Section 564(b)(1) of the Act, 21 U.S.C. section 360bbb-3(b)(1), unless the authorization is terminated or revoked.  Performed at [facility], [address] [place], [place] [address]     Labs: CBC: Recent Labs  Lab 06/13/23 [id number] 06/14/23 0447 06/15/23 0450 06/16/23 0414  WBC 8.1 10.7* 16.2* 11.4*  NEUTROABS 6.1  --   --  8.3*  HGB 11.7* 10.5* 10.7* 10.5*  HCT 37.0* 32.4* 34.1* 33.8*  MCV 95.1 93.6 96.3 96.6  PLT 355 336 347 316   Basic Metabolic Panel: Recent Labs  Lab 06/13/23 0843 06/14/23 0447 06/15/23 0450 06/16/23 0414  NA 134* 134* 135 135  K 2.9* 3.9 4.2 3.8  CL 90* 94* 96* 95*  CO2 33* 32  32 32  GLUCOSE 156* 145* 123* 100*  BUN 17 16 28* 26*  CREATININE 0.52* 0.57* 0.76 0.59*  CALCIUM 8.5* 8.6* 8.8* 8.6*  MG 2.0  --  1.9  --    Liver Function Tests: Recent Labs  Lab 06/13/23 0843  AST 14*  ALT 10  ALKPHOS 87  BILITOT 1.1  PROT 7.2  ALBUMIN 3.4*   CBG: No results for input(s): "GLUCAP" in the last 168 hours.  Discharge time spent: greater than 30 minutes.  Signed: Lorita Rosa, MD [facility] 06/16/2023

## 2023-06-16 NOTE — Progress Notes (Signed)
 AVS reviewed w/ pt who verbalized an understanding. No other questions at this time. PIV removed as noted. Pt dressing for d/c to home. Pt is on 3 L O2 via [place] at home- family member will be picking pt up with home tank. Family enroute.

## 2023-06-16 NOTE — TOC Transition Note (Signed)
 Transition of Care [facility]) - Discharge Note   Patient Details  Name: Clarence Murray MRN: [id number] Date of Birth: 1965-08-15  [facility] CM/SW Contact:  Kathryn Hovis, RN Phone Number: 06/16/2023, 12:18 PM   Clinical Narrative:    Patients oxygen requirements remained the same. TOC signing off   Final next level of care: Home/Self Care Barriers to Discharge: Barriers Resolved   Patient Goals and CMS Choice Patient states their goals for this hospitalization and ongoing recovery are:: Return home          Discharge Placement                       Discharge Plan and Services Additional resources added to the After Visit Summary for     Discharge Planning Services: CM Consult Post Acute Care Choice: Durable Medical Equipment            DME Agency: [facility] (Oxygen)       HH Arranged: NA HH Agency: NA        Social Drivers of Health (SDOH) Interventions SDOH Screenings   Food Insecurity: No Food Insecurity (06/13/2023)  Housing: Low Risk  (06/13/2023)  Transportation Needs: No Transportation Needs (06/13/2023)  Utilities: Not At Risk (06/13/2023)  Tobacco Use: Medium Risk (06/13/2023)     Readmission Risk Interventions     No data to display

## 2023-07-23 ENCOUNTER — Telehealth: Payer: Self-pay | Admitting: Emergency Medicine

## 2023-07-23 NOTE — Telephone Encounter (Signed)
 Cmn received from [facility] for Oxygen Therapy.

## 2023-08-04 ENCOUNTER — Ambulatory Visit: Payer: Self-pay | Admitting: Emergency Medicine

## 2023-08-04 NOTE — Telephone Encounter (Signed)
 Chief Complaint: SOB  Symptoms: productive cough, wheeze Frequency: x few days Pertinent Negatives: Patient denies fever, CP, severe SOB Disposition: [] ED /[] Urgent Care (no appt availability in office) / [] Appointment(In office/virtual)/ []  [facility]/ [] Home Care/ [] Refused Recommended Disposition /[] [place] [facility]/ [x]  Follow-up with Pulm Additional Notes: Pt wife, Stana Ear calling with pt in background reporting increased exertional SOB, productive cough and wheezing x few days. Pt reports going to PCP today and received "2 shots in the butt". Pt denies any fevers, URI sx, or CP. [facility] does not appreciate audible SOB/wheezing during call. Pt is speaking in full sentences. Pt reports taking medications as instructed as well as DuoNeb. Triager reinforced usage of Neb. Triager offered to schedule soonest appt available with alternate provider, but pt declined. Triager will forward encounter for Dr. Baldwin Levee 's office to review and advise. Caregiver verbalized understanding and is expecting call back from office for next steps. Triager also advised that if pt does not hear back from office, to follow disposition for further evaluation/treatment.     Copied from CRM [id number]. Topic: Clinical - Red Word Triage >> Aug 04, 2023  4:15 PM Chantha C wrote: Red Word that prompted transfer to Nurse Triage: Patient's spouse Stana Ear [phone] states patient is having a hard time breathing, shortness of breath, wheezing, headaches, dizziness, chest pain for a couple of days. Patient does not have pain, nor fever. Patient wants to see Dr. Baldwin Levee. Please advise. Reason for Disposition  [1] MILD difficulty breathing (e.g., minimal/no SOB at rest, SOB with walking, pulse <100) AND [2] NEW-onset or WORSE than normal  Answer Assessment - Initial Assessment Questions E2C2 Pulmonary Triage - Initial Assessment Questions "Chief Complaint (e.g., cough, sob, wheezing, fever, chills, sweat or additional  symptoms) *Go to specific symptom protocol after initial questions. SOB, wheezing, productive green cough  "How long have symptoms been present?" A couple of days ago  Have you tested for COVID or Flu? Note: If not, ask patient if a home test can be taken. If so, instruct patient to call back for positive results. No  MEDICINES:   "Have you used any OTC meds to help with symptoms?" No If yes, ask "What medications?" N/a  "Have you used your inhalers/maintenance medication?" Yes If yes, "What medications?" Breztri  - 2 puffs twice daily Albuterol  INH DuoNeb - uses 2-3x a day  If inhaler, ask "How many puffs and how often?" Note: Review instructions on medication in the chart. See above  OXYGEN: "Do you wear supplemental oxygen?" Yes If yes, "How many liters are you supposed to use?" 3L ATC  "Do you monitor your oxygen levels?" No If yes, "What is your reading (oxygen level) today?" Does not have one  "What is your usual oxygen saturation reading?"  (Note: Pulmonary O2 sats should be 90% or greater) N/a   3. PATTERN "Does the difficult breathing come and go, or has it been constant since it started?"      intermittent 4. SEVERITY: "How bad is your breathing?" (e.g., mild, moderate, severe)    - MILD: No SOB at rest, mild SOB with walking, speaks normally in sentences, can lie down, no retractions, pulse < 100.    - MODERATE: SOB at rest, SOB with minimal exertion and prefers to sit, cannot lie down flat, speaks in phrases, mild retractions, audible wheezing, pulse 100-120.    - SEVERE: Very SOB at rest, speaks in single words, struggling to breathe, sitting hunched forward, retractions, pulse > 120  Mild - SOB with exertion 5. RECURRENT SYMPTOM: "Have you had difficulty breathing before?" If Yes, ask: "When was the last time?" and "What happened that time?"      yes 6. CARDIAC HISTORY: "Do you have any history of heart disease?" (e.g., heart attack, angina, bypass  surgery, angioplasty)      denies 7. LUNG HISTORY: "Do you have any history of lung disease?"  (e.g., pulmonary embolus, asthma, emphysema)     COPD 8. CAUSE: "What do you think is causing the breathing problem?"      flare 9. OTHER SYMPTOMS: "Do you have any other symptoms? (e.g., dizziness, runny nose, cough, chest pain, fever)     Occasional dizziness, H/A Denies others  Protocols used: Breathing Difficulty-A-AH

## 2023-08-05 ENCOUNTER — Telehealth: Payer: Self-pay | Admitting: Emergency Medicine

## 2023-08-05 NOTE — Telephone Encounter (Signed)
 Dr. Baldwin Levee please advise. You have no openings, [facility] does have acute opening 6/4 as of now.

## 2023-08-05 NOTE — Telephone Encounter (Signed)
Duplicate received.

## 2023-08-05 NOTE — Telephone Encounter (Signed)
 Attempted to call patient. Had to leave voicemail for patient to call our office back to get scheduled.Aaron Aas

## 2023-08-05 NOTE — Telephone Encounter (Signed)
 I think it would be best if he could be seen tomorrow by [facility]

## 2023-08-05 NOTE — Telephone Encounter (Signed)
 I attempted to call multiple numbers in patients chart. Left vm to call back per DPR Routing to front staff to see if pt can be reached and scheduled acute ov.

## 2023-08-15 NOTE — Telephone Encounter (Signed)
 CMN signed, fax conformation attached, and sent to scan.

## 2023-09-04 ENCOUNTER — Emergency Department (HOSPITAL_COMMUNITY)

## 2023-09-04 ENCOUNTER — Observation Stay (HOSPITAL_COMMUNITY)
Admission: EM | Admit: 2023-09-04 | Discharge: 2023-09-06 | Disposition: A | Discharge status: Home / Self Care | Attending: Family Medicine | Admitting: Family Medicine

## 2023-09-04 ENCOUNTER — Encounter (HOSPITAL_COMMUNITY): Payer: Self-pay | Admitting: Emergency Medicine

## 2023-09-04 DIAGNOSIS — G8929 Other chronic pain: Secondary | ICD-10-CM | POA: Diagnosis not present

## 2023-09-04 DIAGNOSIS — F4320 Adjustment disorder, unspecified: Secondary | ICD-10-CM | POA: Diagnosis not present

## 2023-09-04 DIAGNOSIS — J9611 Chronic respiratory failure with hypoxia: Secondary | ICD-10-CM | POA: Diagnosis present

## 2023-09-04 DIAGNOSIS — R112 Nausea with vomiting, unspecified: Principal | ICD-10-CM | POA: Diagnosis present

## 2023-09-04 DIAGNOSIS — E785 Hyperlipidemia, unspecified: Secondary | ICD-10-CM | POA: Diagnosis not present

## 2023-09-04 DIAGNOSIS — K219 Gastro-esophageal reflux disease without esophagitis: Secondary | ICD-10-CM | POA: Diagnosis present

## 2023-09-04 DIAGNOSIS — J449 Chronic obstructive pulmonary disease, unspecified: Secondary | ICD-10-CM | POA: Diagnosis not present

## 2023-09-04 DIAGNOSIS — Z1152 Encounter for screening for COVID-19: Secondary | ICD-10-CM | POA: Insufficient documentation

## 2023-09-04 DIAGNOSIS — R0602 Shortness of breath: Principal | ICD-10-CM

## 2023-09-04 DIAGNOSIS — G4733 Obstructive sleep apnea (adult) (pediatric): Secondary | ICD-10-CM | POA: Diagnosis present

## 2023-09-04 LAB — CBC
HCT: 39.9 % (ref 39.0–52.0)
Hemoglobin: 13.2 g/dL (ref 13.0–17.0)
MCH: 30 pg (ref 26.0–34.0)
MCHC: 33.1 g/dL (ref 30.0–36.0)
MCV: 90.7 fL (ref 80.0–100.0)
Platelets: 280 10*3/uL (ref 150–400)
RBC: 4.4 MIL/uL (ref 4.22–5.81)
RDW: 13.4 % (ref 11.5–15.5)
WBC: 9.4 10*3/uL (ref 4.0–10.5)
nRBC: 0 % (ref 0.0–0.2)

## 2023-09-04 LAB — CBC WITH DIFFERENTIAL/PLATELET
Abs Immature Granulocytes: 0.03 10*3/uL (ref 0.00–0.07)
Basophils Absolute: 0 10*3/uL (ref 0.0–0.1)
Basophils Relative: 0 %
Eosinophils Absolute: 0.1 10*3/uL (ref 0.0–0.5)
Eosinophils Relative: 1 %
HCT: 41.6 % (ref 39.0–52.0)
Hemoglobin: 13.1 g/dL (ref 13.0–17.0)
Immature Granulocytes: 0 %
Lymphocytes Relative: 13 %
Lymphs Abs: 1.2 10*3/uL (ref 0.7–4.0)
MCH: 29.5 pg (ref 26.0–34.0)
MCHC: 31.5 g/dL (ref 30.0–36.0)
MCV: 93.7 fL (ref 80.0–100.0)
Monocytes Absolute: 0.5 10*3/uL (ref 0.1–1.0)
Monocytes Relative: 6 %
Neutro Abs: 7.3 10*3/uL (ref 1.7–7.7)
Neutrophils Relative %: 80 %
Platelets: 281 10*3/uL (ref 150–400)
RBC: 4.44 MIL/uL (ref 4.22–5.81)
RDW: 13.4 % (ref 11.5–15.5)
WBC: 9.1 10*3/uL (ref 4.0–10.5)
nRBC: 0 % (ref 0.0–0.2)

## 2023-09-04 LAB — COMPREHENSIVE METABOLIC PANEL WITH GFR
ALT: 14 U/L (ref 0–44)
AST: 21 U/L (ref 15–41)
Albumin: 4.1 g/dL (ref 3.5–5.0)
Alkaline Phosphatase: 79 U/L (ref 38–126)
Anion gap: 14 (ref 5–15)
BUN: 25 mg/dL — ABNORMAL HIGH (ref 6–20)
CO2: 31 mmol/L (ref 22–32)
Calcium: 8.7 mg/dL — ABNORMAL LOW (ref 8.9–10.3)
Chloride: 89 mmol/L — ABNORMAL LOW (ref 98–111)
Creatinine, Ser: 0.73 mg/dL (ref 0.61–1.24)
GFR, Estimated: >60 mL/min (ref >60)
Glucose, Bld: 89 mg/dL (ref 70–99)
Potassium: 3.4 mmol/L — ABNORMAL LOW (ref 3.5–5.1)
Sodium: 134 mmol/L — ABNORMAL LOW (ref 135–145)
Total Bilirubin: 1.3 mg/dL — ABNORMAL HIGH (ref 0.0–1.2)
Total Protein: 7.9 g/dL (ref 6.5–8.1)

## 2023-09-04 LAB — URINALYSIS, W/ REFLEX TO CULTURE (INFECTION SUSPECTED)
Bacteria, UA: NONE SEEN
Bilirubin Urine: NEGATIVE
Glucose, UA: NEGATIVE mg/dL
Hgb urine dipstick: NEGATIVE
Ketones, ur: 80 mg/dL — AB
Leukocytes,Ua: NEGATIVE
Nitrite: NEGATIVE
Protein, ur: 30 mg/dL — AB
Specific Gravity, Urine: 1.028 (ref 1.005–1.030)
pH: 5 (ref 5.0–8.0)

## 2023-09-04 LAB — MAGNESIUM: Magnesium: 2 mg/dL (ref 1.7–2.4)

## 2023-09-04 LAB — CREATININE, SERUM
Creatinine, Ser: 0.65 mg/dL (ref 0.61–1.24)
GFR, Estimated: >60 mL/min (ref >60)

## 2023-09-04 LAB — RESP PANEL BY RT-PCR (RSV, FLU A&B, COVID)  RVPGX2
Influenza A by PCR: NEGATIVE
Influenza B by PCR: NEGATIVE
Resp Syncytial Virus by PCR: NEGATIVE
SARS Coronavirus 2 by RT PCR: NEGATIVE

## 2023-09-04 LAB — GLUCOSE, CAPILLARY: Glucose-Capillary: 208 mg/dL — ABNORMAL HIGH (ref 70–99)

## 2023-09-04 LAB — TROPONIN I (HIGH SENSITIVITY)
Troponin I (High Sensitivity): 12 ng/L (ref <18)
Troponin I (High Sensitivity): 11 ng/L (ref <18)

## 2023-09-04 LAB — LIPASE, BLOOD: Lipase: 22 U/L (ref 11–51)

## 2023-09-04 MED ORDER — DOCUSATE SODIUM 100 MG PO CAPS
100.0000 mg | ORAL_CAPSULE | Freq: Two times a day (BID) | ORAL | Status: DC
  Administered 2023-09-04 – 2023-09-06 (×4): 100 mg via ORAL
  Filled 2023-09-04 (×4): qty 1

## 2023-09-04 MED ORDER — IPRATROPIUM-ALBUTEROL 0.5-2.5 (3) MG/3ML IN SOLN: 3.0000 mL | Freq: Once | RESPIRATORY_TRACT | Status: DC

## 2023-09-04 MED ORDER — ROSUVASTATIN CALCIUM 5 MG PO TABS
5.0000 mg | ORAL_TABLET | Freq: Every day | ORAL | Status: DC
  Administered 2023-09-05 – 2023-09-06 (×2): 5 mg via ORAL
  Filled 2023-09-04 (×2): qty 1

## 2023-09-04 MED ORDER — SODIUM CHLORIDE 0.9 % IV BOLUS
500.0000 mL | Freq: Once | INTRAVENOUS | Status: AC
  Administered 2023-09-04: 500 mL via INTRAVENOUS

## 2023-09-04 MED ORDER — PANTOPRAZOLE SODIUM 40 MG IV SOLR
40.0000 mg | INTRAVENOUS | Status: DC
  Administered 2023-09-05 – 2023-09-06 (×2): 40 mg via INTRAVENOUS
  Filled 2023-09-04 (×2): qty 10

## 2023-09-04 MED ORDER — ONDANSETRON HCL 4 MG/2ML IJ SOLN
4.0000 mg | Freq: Once | INTRAMUSCULAR | Status: AC
  Administered 2023-09-04: 4 mg via INTRAVENOUS
  Filled 2023-09-04: qty 2

## 2023-09-04 MED ORDER — METHADONE HCL 10 MG/ML PO CONC
100.0000 mg | Freq: Every day | ORAL | Status: DC
  Administered 2023-09-05 – 2023-09-06 (×2): 100 mg via ORAL
  Filled 2023-09-04 (×2): qty 10

## 2023-09-04 MED ORDER — SALINE SPRAY 0.65 % NA SOLN
1.0000 | NASAL | Status: DC | PRN
  Administered 2023-09-05: 1 via NASAL
  Filled 2023-09-04: qty 44

## 2023-09-04 MED ORDER — ACETAMINOPHEN 650 MG RE SUPP
650.0000 mg | Freq: Four times a day (QID) | RECTAL | Status: DC | PRN
Start: 2023-09-04 — End: 2023-09-06

## 2023-09-04 MED ORDER — MELATONIN 5 MG PO TABS
5.0000 mg | ORAL_TABLET | Freq: Every evening | ORAL | Status: DC | PRN
  Administered 2023-09-04 – 2023-09-05 (×2): 5 mg via ORAL
  Filled 2023-09-04 (×2): qty 1

## 2023-09-04 MED ORDER — POTASSIUM CHLORIDE 20 MEQ PO PACK
40.0000 meq | PACK | Freq: Once | ORAL | Status: AC
  Administered 2023-09-05: 40 meq via ORAL
  Filled 2023-09-04: qty 2

## 2023-09-04 MED ORDER — SODIUM CHLORIDE 0.9 % IV SOLN: INTRAVENOUS | Status: AC

## 2023-09-04 MED ORDER — ENOXAPARIN SODIUM 40 MG/0.4ML IJ SOSY
40.0000 mg | PREFILLED_SYRINGE | INTRAMUSCULAR | Status: DC
  Administered 2023-09-04 – 2023-09-05 (×2): 40 mg via SUBCUTANEOUS
  Filled 2023-09-04 (×2): qty 0.4

## 2023-09-04 MED ORDER — ONDANSETRON HCL 4 MG/2ML IJ SOLN
4.0000 mg | Freq: Four times a day (QID) | INTRAMUSCULAR | Status: DC | PRN
  Administered 2023-09-04 – 2023-09-06 (×4): 4 mg via INTRAVENOUS
  Filled 2023-09-04 (×4): qty 2

## 2023-09-04 MED ORDER — BUDESON-GLYCOPYRROL-FORMOTEROL 160-9-4.8 MCG/ACT IN AERO
2.0000 | INHALATION_SPRAY | Freq: Every day | RESPIRATORY_TRACT | Status: DC
  Administered 2023-09-05 – 2023-09-06 (×2): 2 via RESPIRATORY_TRACT
  Filled 2023-09-04: qty 5.9

## 2023-09-04 MED ORDER — BUSPIRONE HCL 10 MG PO TABS
5.0000 mg | ORAL_TABLET | Freq: Three times a day (TID) | ORAL | Status: DC | PRN
  Administered 2023-09-04 – 2023-09-05 (×2): 5 mg via ORAL
  Filled 2023-09-04 (×2): qty 1

## 2023-09-04 MED ORDER — IPRATROPIUM-ALBUTEROL 0.5-2.5 (3) MG/3ML IN SOLN
3.0000 mL | Freq: Once | RESPIRATORY_TRACT | Status: AC
  Administered 2023-09-04: 3 mL via RESPIRATORY_TRACT
  Filled 2023-09-04: qty 3

## 2023-09-04 MED ORDER — ACETAMINOPHEN 325 MG PO TABS
650.0000 mg | ORAL_TABLET | Freq: Four times a day (QID) | ORAL | Status: DC | PRN
  Administered 2023-09-05 – 2023-09-06 (×3): 650 mg via ORAL
  Filled 2023-09-04 (×3): qty 2

## 2023-09-04 MED ORDER — ONDANSETRON HCL 4 MG PO TABS: 4.0000 mg | ORAL_TABLET | Freq: Four times a day (QID) | ORAL | Status: DC | PRN

## 2023-09-04 MED ORDER — IPRATROPIUM-ALBUTEROL 0.5-2.5 (3) MG/3ML IN SOLN
3.0000 mL | Freq: Four times a day (QID) | RESPIRATORY_TRACT | Status: DC | PRN
  Administered 2023-09-05: 3 mL via RESPIRATORY_TRACT
  Filled 2023-09-04: qty 3

## 2023-09-04 NOTE — ED Provider Notes (Signed)
 Emergency Department Provider Note   I have reviewed the triage vital signs and the nursing notes.   HISTORY  Chief Complaint Flu-like Symptoms   HPI Clarence Murray is a 58 y.o. male with past COPD presents to the emergency department with weakness, nausea, poor p.o. intake and shortness of breath.  No chest pain.  Patient wears 3 L nasal cannula oxygen at baseline.  He has been using his home medications without relief.  He feels extremely nauseated, worse with eating.  No abdominal pain.  Has been having increasingly low energy.  No fevers or chills.  No severe headaches.   Past Medical History:  Diagnosis Date   Chronic pain    work injury 02/ 2002 annuler tear C5-6 disk   COPD (chronic obstructive pulmonary disease) (HCC)    GERD (gastroesophageal reflux disease)    History of Rocky Mountain spotted fever age 74   Left inguinal hernia    Opioid abuse, in remission (HCC)    PER PT IN REMISSION SINCE 2012 , BEEN ON METHADONE  SINCE    OSA (obstructive sleep apnea)    per study 06-08-2014 moderate osa / hypopnea syndrome---  PER PT TITRATED FOR CPAP BUT WAS TOLD DID NOT NEED IT IF HE DIDN'T WANT TO    Review of Systems  Constitutional: No fever/chills Eyes: No visual changes. ENT: No sore throat. Cardiovascular: Denies chest pain. Respiratory: Denies shortness of breath. Gastrointestinal: No abdominal pain.  No nausea, no vomiting.  Musculoskeletal: Negative for back pain. Skin: Negative for rash. Neurological: Negative for headaches, focal weakness or numbness.  ____________________________________________   PHYSICAL EXAM:  VITAL SIGNS: ED Triage Vitals [09/04/23 1029]  Encounter Vitals Group     BP (!) 129/90     Pulse Rate 84     Resp 16     Temp 98.2 F (36.8 C)     Temp Source Oral     SpO2 96 %   Constitutional: Alert and oriented. Well appearing and in no acute distress. Eyes: Conjunctivae are normal.  Head: Atraumatic. Nose: No  congestion/rhinnorhea. Mouth/Throat: Mucous membranes are moist.  Neck: No stridor.   Cardiovascular: Normal rate, regular rhythm. Good peripheral circulation. Grossly normal heart sounds.   Respiratory: Normal respiratory effort.  No retractions. Lungs CTAB. Gastrointestinal: Soft and nontender. No distention.  Musculoskeletal: No lower extremity tenderness nor edema. No gross deformities of extremities. Neurologic:  Normal speech and language. No gross focal neurologic deficits are appreciated.  Skin:  Skin is warm, dry and intact. No rash noted.  ____________________________________________   LABS (all labs ordered are listed, but only abnormal results are displayed)  Labs Reviewed  COMPREHENSIVE METABOLIC PANEL WITH GFR - Abnormal; Notable for the following components:      Result Value   Sodium 134 (*)    Potassium 3.4 (*)    Chloride 89 (*)    BUN 25 (*)    Calcium  8.7 (*)    Total Bilirubin 1.3 (*)    All other components within normal limits  URINALYSIS, W/ REFLEX TO CULTURE (INFECTION SUSPECTED) - Abnormal; Notable for the following components:   Ketones, ur 80 (*)    Protein, ur 30 (*)    All other components within normal limits  COMPREHENSIVE METABOLIC PANEL WITH GFR - Abnormal; Notable for the following components:   Chloride 91 (*)    CO2 33 (*)    Glucose, Bld 121 (*)    Calcium  8.6 (*)    Total Protein 6.2 (*)  Albumin 3.4 (*)    All other components within normal limits  CBC - Abnormal; Notable for the following components:   RBC 3.89 (*)    Hemoglobin 11.7 (*)    HCT 36.6 (*)    All other components within normal limits  PHOSPHORUS - Abnormal; Notable for the following components:   Phosphorus 2.4 (*)    All other components within normal limits  GLUCOSE, CAPILLARY - Abnormal; Notable for the following components:   Glucose-Capillary 208 (*)    All other components within normal limits  RESP PANEL BY RT-PCR (RSV, FLU A&B, COVID)  RVPGX2  LIPASE,  BLOOD  CBC WITH DIFFERENTIAL/PLATELET  MAGNESIUM  CBC  CREATININE, SERUM  MAGNESIUM  TROPONIN I (HIGH SENSITIVITY)  TROPONIN I (HIGH SENSITIVITY)   ____________________________________________  EKG   EKG Interpretation Date/Time:  Thursday September 04 2023 10:49:05 EDT Ventricular Rate:  89 PR Interval:  173 QRS Duration:  101 QT Interval:  393 QTC Calculation: 476 R Axis:   78  Text Interpretation: Sinus rhythm Probable left atrial enlargement Nonspecific ST changes Wandering baseline No STEMI Confirmed by Darra Chew [phone]) on 09/04/2023 1:29:59 PM        ____________________________________________  RADIOLOGY  DG Chest 2 View Result Date: 09/04/2023 CLINICAL DATA:  Shortness of breath. EXAM: CHEST - 2 VIEW COMPARISON:  06/13/2023. FINDINGS: Bilateral lungs appear hyperlucent with coarse bronchovascular markings, in keeping with COPD. Bilateral lungs otherwise appear clear. No dense consolidation or lung collapse. Bilateral costophrenic angles are clear. Normal cardio-mediastinal silhouette. No acute osseous abnormalities. The soft tissues are within normal limits. IMPRESSION: No active cardiopulmonary disease. COPD. Electronically Signed   By: Ree Molt M.D.   On: 09/04/2023 11:44    ____________________________________________   PROCEDURES  Procedure(s) performed:   Procedures  None  ____________________________________________   INITIAL IMPRESSION / ASSESSMENT AND PLAN / ED COURSE  Pertinent labs & imaging results that were available during my care of the patient were reviewed by me and considered in my medical decision making (see chart for details).   This patient is Presenting for Evaluation of weakness, which does require a range of treatment options, and is a complaint that involves a high risk of morbidity and mortality.  The Differential Diagnoses include COPD exacerbation, GI illness, bowel obstruction, ACS, PE, etc.  Critical Interventions-     Medications  ipratropium-albuterol  (DUONEB) 0.5-2.5 (3) MG/3ML nebulizer solution 3 mL (3 mLs Nebulization Not Given 09/04/23 1613)  enoxaparin  (LOVENOX ) injection 40 mg (40 mg Subcutaneous Given 09/04/23 2153)  0.9 %  sodium chloride  infusion (0 mLs Intravenous Stopped 09/05/23 0415)  acetaminophen  (TYLENOL ) tablet 650 mg (650 mg Oral Given 09/05/23 0027)    Or  acetaminophen  (TYLENOL ) suppository 650 mg ( Rectal See Alternative 09/05/23 0027)  docusate sodium  (COLACE) capsule 100 mg (100 mg Oral Given 09/04/23 2153)  ondansetron  (ZOFRAN ) tablet 4 mg ( Oral See Alternative 09/04/23 2016)    Or  ondansetron  (ZOFRAN ) injection 4 mg (4 mg Intravenous Given 09/04/23 2016)  ipratropium-albuterol  (DUONEB) 0.5-2.5 (3) MG/3ML nebulizer solution 3 mL (3 mLs Nebulization Given 09/05/23 0350)  methadone  (DOLOPHINE ) 10 MG/ML solution 100 mg (has no administration in time range)  rosuvastatin  (CRESTOR ) tablet 5 mg (has no administration in time range)  busPIRone  (BUSPAR ) tablet 5 mg (5 mg Oral Given 09/05/23 0100)  budesonide -glycopyrrolate -formoterol  (BREZTRI ) 160-9-4.8 MCG/ACT inhaler 2 puff (2 puffs Inhalation Not Given 09/04/23 1840)  pantoprazole  (PROTONIX ) injection 40 mg (40 mg Intravenous Not Given 09/04/23 1848)  sodium chloride  (  OCEAN) 0.65 % nasal spray 1 spray (1 spray Each Nare Given 09/05/23 0400)  melatonin tablet 5 mg (5 mg Oral Given 09/04/23 2153)  guaiFENesin -dextromethorphan  (ROBITUSSIN DM) 100-10 MG/5ML syrup 5 mL (5 mLs Oral Given 09/05/23 0350)  sodium chloride  0.9 % bolus 500 mL (0 mLs Intravenous Stopped 09/04/23 1947)  ondansetron  (ZOFRAN ) injection 4 mg (4 mg Intravenous Given 09/04/23 1210)  ipratropium-albuterol  (DUONEB) 0.5-2.5 (3) MG/3ML nebulizer solution 3 mL (3 mLs Nebulization Given 09/04/23 1208)  potassium chloride  (KLOR-CON ) packet 40 mEq (40 mEq Oral Given 09/05/23 0352)  traZODone  (DESYREL ) tablet 25 mg (25 mg Oral Given 09/05/23 0059)    Reassessment after intervention:  symptoms improved.     Clinical Laboratory Tests Ordered, included COVID-negative.  No acute kidney injury.  UA without infection.  Electrolytes normal.  Radiologic Tests Ordered, included CXR. I independently interpreted the images and agree with radiology interpretation.   Cardiac Monitor Tracing which shows NSR.    Social Determinants of Health Risk patient is a non-smoker.   Consult complete with [facility]. Plan for admit for COPD exacerbation and intractable nausea/vomiting.   Medical Decision Making: Summary:  Patient presents to the emergency department for evaluation of generalized weakness with nausea and poor appetite.  Some shortness of breath as well.  Plan for broad workup including ACS evaluation.  DuoNebs ordered along with IV fluids and Zofran .  Abdomen is diffusely soft and nontender.  Do not immediately plan on abdominal imaging at this time.  Reevaluation with update and discussion with patient. Continues to feel nauseated and weak. Plan for admit. Continue breathing treatments as needed. Wheezing improved.   Patient's presentation is most consistent with acute presentation with potential threat to life or bodily function.   Disposition: admit  ____________________________________________  FINAL CLINICAL IMPRESSION(S) / ED DIAGNOSES  Final diagnoses:  SOB (shortness of breath)  Nausea and vomiting, unspecified vomiting type    Note:  This document was prepared using Dragon voice recognition software and may include unintentional dictation errors.  Fonda Law, MD, [facility]    Clarence Murray, Fonda MATSU, MD 09/05/23 [phone]

## 2023-09-04 NOTE — ED Triage Notes (Signed)
 Pt arriving vie [facility] from home with flu-like symptoms. Nausea and decrease in appetite over the last week. Wears 3L O2 at all times due to COPD. Primary care provider suggested he come to the ER for eval.

## 2023-09-04 NOTE — H&P (Signed)
 History and Physical    [name] Murray:[id number] DOB: [birth year] DOA: 09/04/2023  PCP: [name], MD   Patient coming from: Home  I have personally briefly reviewed patient's old medical records in [facility]  Chief Complaint: Intractable nausea and vomiting, not able to take orally.  HPI: Clarence Murray is 58 y.o. male with PMH significant for COPD, chronic hypoxic respiratory failure on 3.4 L of oxygen at baseline, chronic pain syndrome, GERD, opioid abuse in remission, OSA presented in the ED with complaints of nausea and vomiting for last 1 week.  Patient denies any sick contacts or recent travel.  He reports he initially started with nausea associated with abdominal pain and has thrown up twice.  He is not able to take anything orally for last 2-1/2 days,  states he is feeling very hungry.  He denies any fever, eating outside food.  He also reports having body ache, chills but denies any fever.  This has precipitated his COPD.  He has been using his home inhalers without any relief.  Today he was extremely nauseous with short of breath and decided to come to the ED.  ED Course: He was hemodynamically stable except having wheezing. Temp 98.2, HR 84, RR 16, BP 129/90, SpO2 96% on 3 L Labs include sodium 134, potassium 3.4, chloride 89, bicarb 21, glucose 89, BUN 25, creatinine 0.73, calcium  8.7, magnesium 2.0, anion gap 14, alkaline phosphatase 79, albumin 4.1, lipase 22, AST 21, ALT 14, total protein 7.9, total bilirubin 1.3, troponin 12, WBC 9.1, hemoglobin 13.1, hematocrit 41.6, platelet 281, UA unremarkable, influenza, RSV, COVID-negative, Chest x-ray no active cardiopulmonary disease.  COPD. EKG shows sinus rhythm, probable left atrial enlargement  Review of Systems:  Review of Systems  Constitutional:  Positive for chills and malaise/fatigue.  HENT: Negative.    Eyes: Negative.   Respiratory:  Positive for shortness of breath.   Cardiovascular: Negative.    Gastrointestinal:  Positive for abdominal pain, nausea and vomiting.  Genitourinary: Negative.   Musculoskeletal: Negative.   Skin: Negative.   Neurological: Negative.   Endo/Heme/Allergies: Negative.   Psychiatric/Behavioral: Negative.      Past Medical History:  Diagnosis Date   Chronic pain    work injury 02/ 2002 annuler tear C5-6 disk   COPD (chronic obstructive pulmonary disease) (HCC)    GERD (gastroesophageal reflux disease)    History of Rocky Mountain spotted fever age 1   Left inguinal hernia    Opioid abuse, in remission (HCC)    PER PT IN REMISSION SINCE 2012 , BEEN ON METHADONE  SINCE    OSA (obstructive sleep apnea)    per study 06-08-2014 moderate osa / hypopnea syndrome---  PER PT TITRATED FOR CPAP BUT WAS TOLD DID NOT NEED IT IF HE DIDN'T WANT TO    Past Surgical History:  Procedure Laterality Date   CARDIAC CATHETERIZATION  08-11-2006  dr cooper   Non-obstructive CAD-- minor plaque LAD and LCx/  Normal LVF , ef 65%   INGUINAL HERNIA REPAIR Left 10/02/2016   Procedure: LEFT OPEN INGUINAL HERNIA REPAIR WITH MESH;  Surgeon: Kinsinger, Herlene Righter, MD;  Location: [facility] [place];  Service: General;  Laterality: Left;   INSERTION OF MESH Left 10/02/2016   Procedure: INSERTION OF MESH;  Surgeon: Kinsinger, Herlene Righter, MD;  Location: [facility];  Service: General;  Laterality: Left;   TRANSTHORACIC ECHOCARDIOGRAM  08/07/2006   good overall LVF and LVSF/ inadequate study for evaluation LV wall  motion   UMBILICAL HERNIA REPAIR  09/2015     reports that he quit smoking about 8 years ago. His smoking use included cigarettes. He started smoking about 23 years ago. He has a 30 pack-year smoking history. He has been exposed to tobacco smoke. He has never used smokeless tobacco. He reports that he does not drink alcohol and does not use drugs.  No Known Allergies  Family History  Problem Relation Age of Onset   Cancer Mother        Lung   Heart  failure Maternal Grandmother    Heart attack Father        death at age 63 from MI   Heart attack Sister        death at age 49   COPD Brother    Family history reviewed and not pertinent.  Prior to Admission medications   Medication Sig Start Date End Date Taking? Authorizing Provider  albuterol  (VENTOLIN  HFA) 108 (90 Base) MCG/ACT inhaler Inhale 2 puffs into the lungs 3 (three) times daily as needed for wheezing or shortness of breath. 05/15/17   [provider]  ALEVE 220 MG tablet Take 220-440 mg by mouth 2 (two) times daily as needed (for pain or headaches).    [provider]  BREZTRI  AEROSPHERE 160-9-4.8 MCG/ACT AERO Inhale 2 puffs into the lungs in the morning and at bedtime. 07/28/19   [provider]  busPIRone  (BUSPAR ) 5 MG tablet Take 1 tablet (5 mg total) by mouth 3 (three) times daily as needed (anxiety). 06/16/23   Barbarann Nest, MD  esomeprazole (NEXIUM) 40 MG capsule Take 40 mg by mouth 2 (two) times daily before a meal.    [provider]  ibuprofen  (ADVIL ) 200 MG tablet Take 200-400 mg by mouth every 6 (six) hours as needed for headache (or pain).    [provider]  ipratropium-albuterol  (DUONEB) 0.5-2.5 (3) MG/3ML SOLN Inhale 3 mLs into the lungs every 6 (six) hours as needed (shortness of breath). Patient taking differently: Inhale 6 mLs into the lungs every 6 (six) hours as needed (for shortness of breath). 10/16/22   Shelah Lamar RAMAN, MD  methadone  (DOLOPHINE ) 10 MG/ML solution Take 100 mg by mouth every morning.    [provider]  OXYGEN Inhale 3.5-4 L/min into the lungs continuous.    [provider]  Roflumilast  (DALIRESP ) 250 MCG TABS Take 250 mcg by mouth daily. 04/05/22   Shelah Lamar RAMAN, MD  rosuvastatin  (CRESTOR ) 5 MG tablet Take 5 mg by mouth daily. 04/14/23   [provider]  TYLENOL  500 MG tablet Take 500-1,000 mg by mouth every 6 (six) hours as needed for headache (or pain).    [provider]    Physical Exam: Vitals:   09/04/23 1029 09/04/23 1035 09/04/23 1350  BP: (!) 129/90  124/78  Pulse: 84  89  Resp: 16  16  Temp: 98.2 F (36.8 C)  97.9 F (36.6 C)  TempSrc: Oral  Oral  SpO2: 96% 95% 99%    Constitutional: Appears comfortable, not in any acute distress.  Deconditioned Vitals:   09/04/23 1029 09/04/23 1035 09/04/23 1350  BP: (!) 129/90  124/78  Pulse: 84  89  Resp: 16  16  Temp: 98.2 F (36.8 C)  97.9 F (36.6 C)  TempSrc: Oral  Oral  SpO2: 96% 95% 99%   Eyes: PERRL, lids and conjunctivae normal ENMT: Mucous membranes are moist. Posterior pharynx clear of any exudate or lesions.  Neck: normal, supple, no masses, no thyromegaly Respiratory: CTA bilaterally, no wheezing, no crackles. Normal respiratory effort. No accessory muscle use.  Cardiovascular: S1-S2 heard, regular rate and rhythm, no murmurs / rubs / gallops. No extremity edema.   Abdomen: Soft, no tenderness, no masses palpated. No hepatosplenomegaly. Bowel sounds positive.  Musculoskeletal: No joint deformity upper and lower extremities. Good ROM, no contractures. Normal muscle tone.  Skin: no rashes, lesions, ulcers. No induration Neurologic: CN 2-12 grossly intact. Sensation intact, DTR normal. Strength 5/5 in all 4.  Psychiatric: Normal judgment and insight. Alert and oriented x 3. Normal mood.     Labs on Admission: I have personally reviewed following labs and imaging studies  CBC: Recent Labs  Lab 09/04/23 1155  WBC 9.1  NEUTROABS 7.3  HGB 13.1  HCT 41.6  MCV 93.7  PLT 281   Basic Metabolic Panel: Recent Labs  Lab 09/04/23 1155  NA 134*  K 3.4*  CL 89*  CO2 31  GLUCOSE 89  BUN 25*  CREATININE 0.73  CALCIUM  8.7*  MG 2.0   GFR: CrCl cannot be calculated (Unknown ideal weight.). Liver Function Tests: Recent Labs  Lab 09/04/23 1155  AST 21  ALT 14  ALKPHOS 79  BILITOT 1.3*  PROT 7.9  ALBUMIN 4.1   Recent Labs  Lab 09/04/23 1155  LIPASE 22    No results for input(s): AMMONIA in the last 168 hours. Coagulation Profile: No results for input(s): INR, PROTIME in the last 168 hours. Cardiac Enzymes: No results for input(s): CKTOTAL, CKMB, CKMBINDEX, TROPONINI in the last 168 hours. BNP (last 3 results) No results for input(s): PROBNP in the last 8760 hours. HbA1C: No results for input(s): HGBA1C in the last 72 hours. CBG: No results for input(s): GLUCAP in the last 168 hours. Lipid Profile: No results for input(s): CHOL, HDL, LDLCALC, TRIG, CHOLHDL, LDLDIRECT in the last 72 hours. Thyroid Function Tests: No results for input(s): TSH, T4TOTAL, FREET4, T3FREE, THYROIDAB in the last 72 hours. Anemia Panel: No results for input(s): VITAMINB12, FOLATE, FERRITIN, TIBC, IRON, RETICCTPCT in the last 72 hours. Urine analysis:    Component Value Date/Time   COLORURINE YELLOW 09/04/2023 1215   APPEARANCEUR CLEAR 09/04/2023 1215   LABSPEC 1.028 09/04/2023 1215   PHURINE 5.0 09/04/2023 1215   GLUCOSEU NEGATIVE 09/04/2023 1215   HGBUR NEGATIVE 09/04/2023 1215   BILIRUBINUR NEGATIVE 09/04/2023 1215   KETONESUR 80 (A) 09/04/2023 1215   PROTEINUR 30 (A) 09/04/2023 1215   UROBILINOGEN 1.0 01/08/2008 1053   NITRITE NEGATIVE 09/04/2023 1215   LEUKOCYTESUR NEGATIVE 09/04/2023 1215    Radiological Exams on Admission: DG Chest 2 View Result Date: 09/04/2023 CLINICAL DATA:  Shortness of breath. EXAM: CHEST - 2 VIEW COMPARISON:  06/13/2023. FINDINGS: Bilateral lungs appear hyperlucent with coarse bronchovascular markings, in keeping with COPD. Bilateral lungs otherwise appear clear. No dense consolidation or lung collapse. Bilateral costophrenic angles are clear. Normal cardio-mediastinal silhouette. No acute osseous abnormalities. The soft tissues are within normal limits. IMPRESSION: No active cardiopulmonary disease. COPD. Electronically Signed   By: Ree Molt M.D.   On: 09/04/2023  11:44    EKG: Independently reviewed.  Normal sinus rhythm, left atrial enlargement  Assessment/Plan Principal Problem:   Intractable nausea and vomiting Active Problems:   COPD (chronic obstructive pulmonary disease) (HCC)   Chronic pain   OSA (obstructive sleep apnea)   Chronic respiratory failure with hypoxia (HCC)   Dyslipidemia   GERD without esophagitis   Adjustment disorder  Intractable nausea and  vomiting: Flulike symptoms: Patient presented with nausea , vomiting and flulike symptoms. He denies any recent travel or sick contacts. He is not able to tolerate anything down. COVID, influenza, RSV negative. Continue pantoprazole  40 mg IV daily Continue Zofran  as needed Continue IV fluid resuscitation.  Chronic hypoxic respiratory failure COPD: Patient presented to the ED, He was significantly wheezing. Patient has received DuoNeb nebulization. He remains on his baseline oxygen requirement. Hold on the steroids as wheezing has improved. Continue DuoNeb nebulization every 6 hours as needed  Chronic pain syndrome: Continue methadone  100 mg daily.  GERD without esophagitis: Continue pantoprazole  40 mg IV daily.  Hyperlipidemia: Continue Crestor   Anxiety disorder: Continue BuSpar    DVT prophylaxis: Lovenox  Code Status: Full code Family Communication: No family at bedside Disposition Plan:    Status is: Observation The patient remains OBS appropriate and will d/c before 2 midnights. Admitted for intractable nausea and vomiting in the setting of flulike symptoms   Consults called: None Admission status: Observation   Darcel Dawley MD [facility]   If 7PM-7AM, please contact night-coverage   09/04/2023, 3:25 PM

## 2023-09-04 NOTE — Progress Notes (Signed)
 Around 1815 this evening patient called and asked for nurse. This nurse went into patients room and patient was blurry vision. Advised charge RN Chiquita Longs of symptoms. Checked patients blood sugar, CBG was 207. Vitals were within normal limits. Blood pressure was 121/67, heart rate was 88, oxygen was 100% on 3.5 liters of O2 which is patients baseline. Paged Darcel Dawley MD. Dr. Dawley advised that this could be due to symptoms and to continue to monitor overnight. Night shift nurse aware. No new orders.

## 2023-09-04 NOTE — Plan of Care (Signed)

## 2023-09-05 DIAGNOSIS — R112 Nausea with vomiting, unspecified: Secondary | ICD-10-CM | POA: Diagnosis not present

## 2023-09-05 LAB — COMPREHENSIVE METABOLIC PANEL WITH GFR
ALT: 11 U/L (ref 0–44)
AST: 16 U/L (ref 15–41)
Albumin: 3.4 g/dL — ABNORMAL LOW (ref 3.5–5.0)
Alkaline Phosphatase: 65 U/L (ref 38–126)
Anion gap: 11 (ref 5–15)
BUN: 19 mg/dL (ref 6–20)
CO2: 33 mmol/L — ABNORMAL HIGH (ref 22–32)
Calcium: 8.6 mg/dL — ABNORMAL LOW (ref 8.9–10.3)
Chloride: 91 mmol/L — ABNORMAL LOW (ref 98–111)
Creatinine, Ser: 0.66 mg/dL (ref 0.61–1.24)
GFR, Estimated: >60 mL/min (ref >60)
Glucose, Bld: 121 mg/dL — ABNORMAL HIGH (ref 70–99)
Potassium: 3.5 mmol/L (ref 3.5–5.1)
Sodium: 135 mmol/L (ref 135–145)
Total Bilirubin: 1 mg/dL (ref 0.0–1.2)
Total Protein: 6.2 g/dL — ABNORMAL LOW (ref 6.5–8.1)

## 2023-09-05 LAB — CBC
HCT: 36.6 % — ABNORMAL LOW (ref 39.0–52.0)
Hemoglobin: 11.7 g/dL — ABNORMAL LOW (ref 13.0–17.0)
MCH: 30.1 pg (ref 26.0–34.0)
MCHC: 32 g/dL (ref 30.0–36.0)
MCV: 94.1 fL (ref 80.0–100.0)
Platelets: 245 K/uL (ref 150–400)
RBC: 3.89 MIL/uL — ABNORMAL LOW (ref 4.22–5.81)
RDW: 13.6 % (ref 11.5–15.5)
WBC: 6.8 K/uL (ref 4.0–10.5)
nRBC: 0 % (ref 0.0–0.2)

## 2023-09-05 LAB — PHOSPHORUS: Phosphorus: 2.4 mg/dL — ABNORMAL LOW (ref 2.5–4.6)

## 2023-09-05 LAB — MAGNESIUM: Magnesium: 2 mg/dL (ref 1.7–2.4)

## 2023-09-05 MED ORDER — GUAIFENESIN-DM 100-10 MG/5ML PO SYRP
5.0000 mL | ORAL_SOLUTION | ORAL | Status: DC | PRN
  Administered 2023-09-05 (×2): 5 mL via ORAL
  Filled 2023-09-05 (×2): qty 5

## 2023-09-05 MED ORDER — TRAZODONE HCL 50 MG PO TABS
25.0000 mg | ORAL_TABLET | Freq: Once | ORAL | Status: AC
  Administered 2023-09-05: 25 mg via ORAL
  Filled 2023-09-05: qty 1

## 2023-09-05 NOTE — Progress Notes (Signed)
 PROGRESS NOTE    Clarence Murray  FMW:[id number] DOB: 02-21-1966 DOA: 09/04/2023 PCP: [name], MD   Brief Narrative:  This 58 y.o. male with PMH significant for COPD, chronic respiratory failure on 3.4 L of oxygen at baseline, chronic pain syndrome, GERD, opioid abuse in remission, OSA presented in the ED with complaints of nausea and vomiting for last 1 week.  Patient denies any sick contacts or recent travel.  He reports he initially started with nausea associated with abdominal pain and has thrown up twice.  He is not able to take anything orally for last 2-1/2 days,  states he is feeling very hungry.  He denies any fever, eating outside food.  He also reports having body ache, chills but denies any fever.  This has precipitated his COPD.  He has been using his home inhalers without any relief.  Today he was extremely nauseous with short of breath and decided to come to the ED. patient was admitted for intractable nausea and vomiting.  Assessment & Plan:   Principal Problem:   Intractable nausea and vomiting Active Problems:   COPD (chronic obstructive pulmonary disease) (HCC)   Chronic pain   OSA (obstructive sleep apnea)   Chronic respiratory failure with hypoxia (HCC)   Dyslipidemia   GERD without esophagitis   Adjustment disorder  Intractable nausea and vomiting: Flulike symptoms: Patient presented with nausea , vomiting and flulike symptoms. He denies any recent travel or sick contacts. He is not able to tolerate anything down. COVID, influenza, RSV negative. Continue pantoprazole  40 mg IV daily Continue Zofran  as needed Continue IV fluid resuscitation. Patient is started on clear liquid diet, He tolerated well. will advance to soft diet.   Chronic hypoxic respiratory failure COPD: Patient presented to the ED, He was significantly wheezing. Patient has received DuoNeb nebulization. He remains on his baseline oxygen requirement. Hold on the steroids as wheezing has  improved. Continue DuoNeb nebulization every 6 hours as needed   Chronic pain syndrome: Continue methadone  100 mg daily.   GERD without esophagitis: Continue pantoprazole  40 mg IV daily.   Hyperlipidemia: Continue Crestor    Anxiety disorder: Continue BuSpar     DVT prophylaxis:Lovenox  Code Status: Full code Family Communication: No family at bed side Disposition Plan:    Status is: Observation The patient remains OBS appropriate and will d/c before 2 midnights. Admitted for intractable nausea and vomiting leading to dehydration.   Consultants:  None  Procedures: None  Antimicrobials:  Anti-infectives (From admission, onward)    None      Subjective: Patient was seen and examined at bedside. Overnight events noted. Patient reports still having some nausea but improving.  He tolerated clear liquid diet , He wants to eat regular food  Objective: Vitals:   09/04/23 2019 09/04/23 2343 09/05/23 0450 09/05/23 0824  BP: 109/73 115/71 108/70   Pulse: 73 75 78   Resp: 16 17    Temp: 98.1 F (36.7 C) 98.1 F (36.7 C) 97.6 F (36.4 C)   TempSrc: Oral Oral Oral   SpO2: 97% 98% 96% 97%    Intake/Output Summary (Last 24 hours) at 09/05/2023 1405 Last data filed at 09/05/2023 0900 Gross per 24 hour  Intake 1541.67 ml  Output 475 ml  Net 1066.67 ml   There were no vitals filed for this visit.  Examination:  General exam: Appears calm and comfortable, not in any acute distress. Respiratory system: Clear to auscultation. Respiratory effort normal. RR 15 Cardiovascular system: S1 & S2  heard, RRR. No JVD, murmurs, rubs, gallops or clicks.  Gastrointestinal system: Abdomen is non distended, soft and non tender.  Normal bowel sounds heard. Central nervous system: Alert and oriented X 3. No focal neurological deficits. Extremities: No edema, no cyanosis, no clubbing. Skin: No rashes, lesions or ulcers Psychiatry: Judgement and insight appear normal. Mood & affect  appropriate.     Data Reviewed: I have personally reviewed following labs and imaging studies  CBC: Recent Labs  Lab 09/04/23 1155 09/04/23 1644 09/05/23 0517  WBC 9.1 9.4 6.8  NEUTROABS 7.3  --   --   HGB 13.1 13.2 11.7*  HCT 41.6 39.9 36.6*  MCV 93.7 90.7 94.1  PLT 281 280 245   Basic Metabolic Panel: Recent Labs  Lab 09/04/23 1155 09/04/23 1644 09/05/23 0517  NA 134*  --  135  K 3.4*  --  3.5  CL 89*  --  91*  CO2 31  --  33*  GLUCOSE 89  --  121*  BUN 25*  --  19  CREATININE 0.73 0.65 0.66  CALCIUM  8.7*  --  8.6*  MG 2.0  --  2.0  PHOS  --   --  2.4*   GFR: CrCl cannot be calculated (Unknown ideal weight.). Liver Function Tests: Recent Labs  Lab 09/04/23 1155 09/05/23 0517  AST 21 16  ALT 14 11  ALKPHOS 79 65  BILITOT 1.3* 1.0  PROT 7.9 6.2*  ALBUMIN 4.1 3.4*   Recent Labs  Lab 09/04/23 1155  LIPASE 22   No results for input(s): AMMONIA in the last 168 hours. Coagulation Profile: No results for input(s): INR, PROTIME in the last 168 hours. Cardiac Enzymes: No results for input(s): CKTOTAL, CKMB, CKMBINDEX, TROPONINI in the last 168 hours. BNP (last 3 results) No results for input(s): PROBNP in the last 8760 hours. HbA1C: No results for input(s): HGBA1C in the last 72 hours. CBG: Recent Labs  Lab 09/04/23 1826  GLUCAP 208*   Lipid Profile: No results for input(s): CHOL, HDL, LDLCALC, TRIG, CHOLHDL, LDLDIRECT in the last 72 hours. Thyroid Function Tests: No results for input(s): TSH, T4TOTAL, FREET4, T3FREE, THYROIDAB in the last 72 hours. Anemia Panel: No results for input(s): VITAMINB12, FOLATE, FERRITIN, TIBC, IRON, RETICCTPCT in the last 72 hours. Sepsis Labs: No results for input(s): PROCALCITON, LATICACIDVEN in the last 168 hours.  Recent Results (from the past 240 hours)  Resp panel by RT-PCR (RSV, Flu A&B, Covid) Anterior Nasal Swab     Status: None   Collection Time:  09/04/23 12:15 PM   Specimen: Anterior Nasal Swab  Result Value Ref Range Status   SARS Coronavirus 2 by RT PCR NEGATIVE NEGATIVE Final    Comment: (NOTE) SARS-CoV-2 target nucleic acids are NOT DETECTED.  The SARS-CoV-2 RNA is generally detectable in upper respiratory specimens during the acute phase of infection. The lowest concentration of SARS-CoV-2 viral copies this assay can detect is 138 copies/mL. A negative result does not preclude SARS-Cov-2 infection and should not be used as the sole basis for treatment or other patient management decisions. A negative result may occur with  improper specimen collection/handling, submission of specimen other than nasopharyngeal swab, presence of viral mutation(s) within the areas targeted by this assay, and inadequate number of viral copies(<138 copies/mL). A negative result must be combined with clinical observations, patient history, and epidemiological information. The expected result is Negative.  Fact Sheet for Patients:  BloggerCourse.com  Fact Sheet for Healthcare Providers:  SeriousBroker.it  This test  is no t yet approved or cleared by the [place]  FDA and  has been authorized for detection and/or diagnosis of SARS-CoV-2 by FDA under an Emergency Use Authorization (EUA). This EUA will remain  in effect (meaning this test can be used) for the duration of the COVID-19 declaration under Section 564(b)(1) of the Act, 21 U.S.C.section 360bbb-3(b)(1), unless the authorization is terminated  or revoked sooner.       Influenza A by PCR NEGATIVE NEGATIVE Final   Influenza B by PCR NEGATIVE NEGATIVE Final    Comment: (NOTE) The Xpert Xpress SARS-CoV-2/FLU/RSV plus assay is intended as an aid in the diagnosis of influenza from Nasopharyngeal swab specimens and should not be used as a sole basis for treatment. Nasal washings and aspirates are unacceptable for Xpert Xpress  SARS-CoV-2/FLU/RSV testing.  Fact Sheet for Patients: BloggerCourse.com  Fact Sheet for Healthcare Providers: SeriousBroker.it  This test is not yet approved or cleared by the [place]  FDA and has been authorized for detection and/or diagnosis of SARS-CoV-2 by FDA under an Emergency Use Authorization (EUA). This EUA will remain in effect (meaning this test can be used) for the duration of the COVID-19 declaration under Section 564(b)(1) of the Act, 21 U.S.C. section 360bbb-3(b)(1), unless the authorization is terminated or revoked.     Resp Syncytial Virus by PCR NEGATIVE NEGATIVE Final    Comment: (NOTE) Fact Sheet for Patients: BloggerCourse.com  Fact Sheet for Healthcare Providers: SeriousBroker.it  This test is not yet approved or cleared by the [place]  FDA and has been authorized for detection and/or diagnosis of SARS-CoV-2 by FDA under an Emergency Use Authorization (EUA). This EUA will remain in effect (meaning this test can be used) for the duration of the COVID-19 declaration under Section 564(b)(1) of the Act, 21 U.S.C. section 360bbb-3(b)(1), unless the authorization is terminated or revoked.  Performed at [facility], [address], [place], [place] [address]     Radiology Studies: DG Chest 2 View Result Date: 09/04/2023 CLINICAL DATA:  Shortness of breath. EXAM: CHEST - 2 VIEW COMPARISON:  06/13/2023. FINDINGS: Bilateral lungs appear hyperlucent with coarse bronchovascular markings, in keeping with COPD. Bilateral lungs otherwise appear clear. No dense consolidation or lung collapse. Bilateral costophrenic angles are clear. Normal cardio-mediastinal silhouette. No acute osseous abnormalities. The soft tissues are within normal limits. IMPRESSION: No active cardiopulmonary disease. COPD. Electronically Signed   By: Ree Molt M.D.    On: 09/04/2023 11:44   Scheduled Meds:  budesonide -glycopyrrolate -formoterol   2 puff Inhalation Daily   docusate sodium   100 mg Oral BID   enoxaparin  (LOVENOX ) injection  40 mg Subcutaneous Q24H   ipratropium-albuterol   3 mL Nebulization Once   methadone   100 mg Oral Daily   pantoprazole  (PROTONIX ) IV  40 mg Intravenous Q24H   rosuvastatin   5 mg Oral Daily   Continuous Infusions:  sodium chloride  Stopped (09/05/23 0415)     LOS: 0 days    Time spent: 35 mins    Darcel Dawley, MD [facility]   If 7PM-7AM, please contact night-coverage

## 2023-09-05 NOTE — Plan of Care (Signed)
  Problem: Education: Goal: Knowledge of General Education information will improve Description: Including pain rating scale, medication(s)/side effects and non-pharmacologic comfort measures Outcome: Progressing   Problem: Clinical Measurements: Goal: Will remain free from infection Outcome: Progressing Goal: Diagnostic test results will improve Outcome: Progressing   Problem: Activity: Goal: Risk for activity intolerance will decrease Outcome: Progressing   Problem: Nutrition: Goal: Adequate nutrition will be maintained Outcome: Progressing   Problem: Coping: Goal: Level of anxiety will decrease Outcome: Progressing   Problem: Elimination: Goal: Will not experience complications related to bowel motility Outcome: Progressing Goal: Will not experience complications related to urinary retention Outcome: Progressing   Problem: Safety: Goal: Ability to remain free from injury will improve Outcome: Progressing

## 2023-09-06 ENCOUNTER — Other Ambulatory Visit (HOSPITAL_COMMUNITY): Payer: Self-pay

## 2023-09-06 DIAGNOSIS — R112 Nausea with vomiting, unspecified: Secondary | ICD-10-CM | POA: Diagnosis not present

## 2023-09-06 MED ORDER — ONDANSETRON HCL 4 MG PO TABS
4.0000 mg | ORAL_TABLET | Freq: Every day | ORAL | 1 refills | Status: AC | PRN
  Filled 2023-09-06: qty 30, 30d supply, fill #0

## 2023-09-06 NOTE — Discharge Instructions (Signed)
 Advised to follow-up with primary care physician in 1 week. Advised to take Zofran  as needed for nausea and vomiting. Medication compliance discussed with patient in detail

## 2023-09-06 NOTE — Progress Notes (Signed)
   09/06/23 1130  TOC Brief Assessment  Insurance and Status Reviewed  Patient has primary care physician Yes  Home environment has been reviewed single family home  Prior level of function: independent  Prior/Current Home Services Current home services (Home O2)  Social Drivers of Health Review SDOH reviewed no interventions necessary  Readmission risk has been reviewed Yes  Transition of care needs no transition of care needs at this time    Heather Saltness, MSW, LCSW 09/06/2023 11:30 AM

## 2023-09-06 NOTE — Discharge Summary (Signed)
 Physician Discharge Summary  Clarence Murray FMW:[id number] DOB: [birth year] DOA: 09/04/2023  PCP: [name], MD  Admit date: 09/04/2023  Discharge date: 09/06/2023  Admitted From: Home  Disposition:  Home  Recommendations for Outpatient Follow-up:  Follow up with PCP in 1-2 weeks. Please obtain BMP/CBC in one week. Advised to take Zofran  as needed for nausea and vomiting. Medication compliance discussed with patient in detail.  Home Health: None Equipment/Devices:Home oxygen @ 3 L/min  Discharge Condition: Stable CODE STATUS:Full code Diet recommendation: Heart Healthy   Brief [facility] Course: This 58 y.o. male with PMH significant for COPD, chronic respiratory failure on 3.4 L of oxygen at baseline, chronic pain syndrome, GERD, opioid abuse in remission, OSA presented in the ED with complaints of nausea and vomiting for last 1 week.  Patient denies any sick contacts or recent travel.  He reports he initially started with nausea associated with abdominal pain and has thrown up twice.  He is not able to take anything orally for last 2-1/2 days,  states he is feeling very hungry.  He denies any fever, eating outside food. He also reports having body ache, chills but denies any fever.  This has precipitated his COPD.  He has been using his home inhalers without any relief.  Today he was extremely nauseous with short of breath and decided to come to the ED. patient was admitted for intractable nausea and vomiting.  Patient was continued on IV hydration, IV Zofran  as needed for nausea and vomiting.  Patient was continued on regular home inhalers.  Patient has made significant improvement.  He tolerated soft bland diet.  He wants to be discharged home.   Discharge Diagnoses:  Principal Problem:   Intractable nausea and vomiting Active Problems:   COPD (chronic obstructive pulmonary disease) (HCC)   Chronic pain   OSA (obstructive sleep apnea)   Chronic respiratory failure with  hypoxia (HCC)   Dyslipidemia   GERD without esophagitis   Adjustment disorder  Intractable nausea and vomiting: Flulike symptoms: Patient presented with nausea , vomiting and flulike symptoms. He denies any recent travel or sick contacts. He is not able to tolerate anything down. COVID, influenza, RSV negative. Continue pantoprazole  40 mg IV daily. Continue Zofran  as needed. Continue IV fluid resuscitation. Patient is started on clear liquid diet, He tolerated well. will advance to soft diet. Nausea and vomiting has improved.   Chronic hypoxic respiratory failure: COPD: Patient presented to the ED, He was significantly wheezing. Patient has received DuoNeb nebulization. He remains on his baseline oxygen requirement. Hold on the steroids as wheezing has improved. Continue DuoNeb nebulization every 6 hours as needed.   Chronic pain syndrome: Continue methadone  100 mg daily.   GERD without esophagitis: Continue pantoprazole  40 mg daily.   Hyperlipidemia: Continue Crestor    Anxiety disorder: Continue BuSpar     Discharge Instructions  Discharge Instructions     Call MD for:  difficulty breathing, headache or visual disturbances   Complete by: As directed    Call MD for:  persistant dizziness or light-headedness   Complete by: As directed    Call MD for:  persistant nausea and vomiting   Complete by: As directed    Diet - low sodium heart healthy   Complete by: As directed    Diet Carb Modified   Complete by: As directed    Discharge instructions   Complete by: As directed    Advised to follow-up with primary care physician in 1 week. Advised  to take Zofran  as needed for nausea and vomiting. Medication compliance discussed with patient in detail   Increase activity slowly   Complete by: As directed       Allergies as of 09/06/2023   No Known Allergies      Medication List     STOP taking these medications    ibuprofen  200 MG tablet Commonly known as:  ADVIL        TAKE these medications    albuterol  108 (90 Base) MCG/ACT inhaler Commonly known as: VENTOLIN  HFA Inhale 2 puffs into the lungs daily as needed for wheezing or shortness of breath.   Aleve 220 MG tablet Generic drug: naproxen sodium Take 220 mg by mouth daily.   Breztri  Aerosphere 160-9-4.8 MCG/ACT Aero inhaler Generic drug: budesonide -glycopyrrolate -formoterol  Inhale 2 puffs into the lungs in the morning and at bedtime.   busPIRone  5 MG tablet Commonly known as: BUSPAR  Take 1 tablet (5 mg total) by mouth 3 (three) times daily as needed (anxiety). What changed: when to take this   esomeprazole 40 MG capsule Commonly known as: NEXIUM Take 40 mg by mouth 2 (two) times daily before a meal.   ipratropium-albuterol  0.5-2.5 (3) MG/3ML Soln Commonly known as: DUONEB Inhale 3 mLs into the lungs every 6 (six) hours as needed (shortness of breath). What changed:  how much to take when to take this   methadone  10 MG/ML solution Commonly known as: DOLOPHINE  Take 100 mg by mouth every morning.   ondansetron  4 MG tablet Commonly known as: Zofran  Take 1 tablet (4 mg total) by mouth daily as needed for nausea or vomiting.   OXYGEN Inhale 3.5 L/min into the lungs continuous.   Roflumilast  250 MCG Tabs Commonly known as: Daliresp  Take 250 mcg by mouth daily.   rosuvastatin  5 MG tablet Commonly known as: CRESTOR  Take 5 mg by mouth daily.        Follow-up Information     [name], MD Follow up in 1 week(s).   Specialty: Internal Medicine Contact information: [address] [place] [address] [phone]                No Known Allergies  Consultations: None   Procedures/Studies: DG Chest 2 View Result Date: 09/04/2023 CLINICAL DATA:  Shortness of breath. EXAM: CHEST - 2 VIEW COMPARISON:  06/13/2023. FINDINGS: Bilateral lungs appear hyperlucent with coarse bronchovascular markings, in keeping with COPD. Bilateral lungs otherwise  appear clear. No dense consolidation or lung collapse. Bilateral costophrenic angles are clear. Normal cardio-mediastinal silhouette. No acute osseous abnormalities. The soft tissues are within normal limits. IMPRESSION: No active cardiopulmonary disease. COPD. Electronically Signed   By: Ree Molt M.D.   On: 09/04/2023 11:44   Subjective: Patient was seen and examined at bedside.  Overnight events noted.   Patient reports feeling much improved , He wants to be discharged home.  Discharge Exam: Vitals:   09/06/23 0639 09/06/23 1200  BP:  114/71  Pulse:  66  Resp:    Temp:    SpO2: 97% 96%   Vitals:   09/05/23 2016 09/06/23 0505 09/06/23 0639 09/06/23 1200  BP: 104/64 110/67  114/71  Pulse: 72 71  66  Resp: 17 18    Temp: (!) 97.5 F (36.4 C) 97.9 F (36.6 C)    TempSrc: Oral Oral    SpO2: 99% 97% 97% 96%    General: Pt is alert, awake, not in acute distress Cardiovascular: RRR, S1/S2 +, no rubs, no gallops  Respiratory: CTA bilaterally, no wheezing, no rhonchi Abdominal: Soft, NT, ND, bowel sounds + Extremities: no edema, no cyanosis    The results of significant diagnostics from this hospitalization (including imaging, microbiology, ancillary and laboratory) are listed below for reference.     Microbiology: Recent Results (from the past 240 hours)  Resp panel by RT-PCR (RSV, Flu A&B, Covid) Anterior Nasal Swab     Status: None   Collection Time: 09/04/23 12:15 PM   Specimen: Anterior Nasal Swab  Result Value Ref Range Status   SARS Coronavirus 2 by RT PCR NEGATIVE NEGATIVE Final    Comment: (NOTE) SARS-CoV-2 target nucleic acids are NOT DETECTED.  The SARS-CoV-2 RNA is generally detectable in upper respiratory specimens during the acute phase of infection. The lowest concentration of SARS-CoV-2 viral copies this assay can detect is 138 copies/mL. A negative result does not preclude SARS-Cov-2 infection and should not be used as the sole basis for treatment  or other patient management decisions. A negative result may occur with  improper specimen collection/handling, submission of specimen other than nasopharyngeal swab, presence of viral mutation(s) within the areas targeted by this assay, and inadequate number of viral copies(<138 copies/mL). A negative result must be combined with clinical observations, patient history, and epidemiological information. The expected result is Negative.  Fact Sheet for Patients:  BloggerCourse.com  Fact Sheet for Healthcare Providers:  SeriousBroker.it  This test is no t yet approved or cleared by the [place]  FDA and  has been authorized for detection and/or diagnosis of SARS-CoV-2 by FDA under an Emergency Use Authorization (EUA). This EUA will remain  in effect (meaning this test can be used) for the duration of the COVID-19 declaration under Section 564(b)(1) of the Act, 21 U.S.C.section 360bbb-3(b)(1), unless the authorization is terminated  or revoked sooner.       Influenza A by PCR NEGATIVE NEGATIVE Final   Influenza B by PCR NEGATIVE NEGATIVE Final    Comment: (NOTE) The Xpert Xpress SARS-CoV-2/FLU/RSV plus assay is intended as an aid in the diagnosis of influenza from Nasopharyngeal swab specimens and should not be used as a sole basis for treatment. Nasal washings and aspirates are unacceptable for Xpert Xpress SARS-CoV-2/FLU/RSV testing.  Fact Sheet for Patients: BloggerCourse.com  Fact Sheet for Healthcare Providers: SeriousBroker.it  This test is not yet approved or cleared by the [place]  FDA and has been authorized for detection and/or diagnosis of SARS-CoV-2 by FDA under an Emergency Use Authorization (EUA). This EUA will remain in effect (meaning this test can be used) for the duration of the COVID-19 declaration under Section 564(b)(1) of the Act, 21 U.S.C. section  360bbb-3(b)(1), unless the authorization is terminated or revoked.     Resp Syncytial Virus by PCR NEGATIVE NEGATIVE Final    Comment: (NOTE) Fact Sheet for Patients: BloggerCourse.com  Fact Sheet for Healthcare Providers: SeriousBroker.it  This test is not yet approved or cleared by the [place]  FDA and has been authorized for detection and/or diagnosis of SARS-CoV-2 by FDA under an Emergency Use Authorization (EUA). This EUA will remain in effect (meaning this test can be used) for the duration of the COVID-19 declaration under Section 564(b)(1) of the Act, 21 U.S.C. section 360bbb-3(b)(1), unless the authorization is terminated or revoked.  Performed at [facility], [address], [place], [place] [address]      Labs: BNP (last 3 results) No results for input(s): BNP in the last 8760 hours. Basic Metabolic Panel: Recent Labs  Lab 09/04/23  1155 09/04/23 1644 09/05/23 0517  NA 134*  --  135  K 3.4*  --  3.5  CL 89*  --  91*  CO2 31  --  33*  GLUCOSE 89  --  121*  BUN 25*  --  19  CREATININE 0.73 0.65 0.66  CALCIUM  8.7*  --  8.6*  MG 2.0  --  2.0  PHOS  --   --  2.4*   Liver Function Tests: Recent Labs  Lab 09/04/23 1155 09/05/23 0517  AST 21 16  ALT 14 11  ALKPHOS 79 65  BILITOT 1.3* 1.0  PROT 7.9 6.2*  ALBUMIN 4.1 3.4*   Recent Labs  Lab 09/04/23 1155  LIPASE 22   No results for input(s): AMMONIA in the last 168 hours. CBC: Recent Labs  Lab 09/04/23 1155 09/04/23 1644 09/05/23 0517  WBC 9.1 9.4 6.8  NEUTROABS 7.3  --   --   HGB 13.1 13.2 11.7*  HCT 41.6 39.9 36.6*  MCV 93.7 90.7 94.1  PLT 281 280 245   Cardiac Enzymes: No results for input(s): CKTOTAL, CKMB, CKMBINDEX, TROPONINI in the last 168 hours. BNP: Invalid input(s): POCBNP CBG: Recent Labs  Lab 09/04/23 1826  GLUCAP 208*   D-Dimer No results for input(s): DDIMER in the last 72  hours. Hgb A1c No results for input(s): HGBA1C in the last 72 hours. Lipid Profile No results for input(s): CHOL, HDL, LDLCALC, TRIG, CHOLHDL, LDLDIRECT in the last 72 hours. Thyroid function studies No results for input(s): TSH, T4TOTAL, T3FREE, THYROIDAB in the last 72 hours.  Invalid input(s): FREET3 Anemia work up No results for input(s): VITAMINB12, FOLATE, FERRITIN, TIBC, IRON, RETICCTPCT in the last 72 hours. Urinalysis    Component Value Date/Time   COLORURINE YELLOW 09/04/2023 1215   APPEARANCEUR CLEAR 09/04/2023 1215   LABSPEC 1.028 09/04/2023 1215   PHURINE 5.0 09/04/2023 1215   GLUCOSEU NEGATIVE 09/04/2023 1215   HGBUR NEGATIVE 09/04/2023 1215   BILIRUBINUR NEGATIVE 09/04/2023 1215   KETONESUR 80 (A) 09/04/2023 1215   PROTEINUR 30 (A) 09/04/2023 1215   UROBILINOGEN 1.0 01/08/2008 1053   NITRITE NEGATIVE 09/04/2023 1215   LEUKOCYTESUR NEGATIVE 09/04/2023 1215   Sepsis Labs Recent Labs  Lab 09/04/23 1155 09/04/23 1644 09/05/23 0517  WBC 9.1 9.4 6.8   Microbiology Recent Results (from the past 240 hours)  Resp panel by RT-PCR (RSV, Flu A&B, Covid) Anterior Nasal Swab     Status: None   Collection Time: 09/04/23 12:15 PM   Specimen: Anterior Nasal Swab  Result Value Ref Range Status   SARS Coronavirus 2 by RT PCR NEGATIVE NEGATIVE Final    Comment: (NOTE) SARS-CoV-2 target nucleic acids are NOT DETECTED.  The SARS-CoV-2 RNA is generally detectable in upper respiratory specimens during the acute phase of infection. The lowest concentration of SARS-CoV-2 viral copies this assay can detect is 138 copies/mL. A negative result does not preclude SARS-Cov-2 infection and should not be used as the sole basis for treatment or other patient management decisions. A negative result may occur with  improper specimen collection/handling, submission of specimen other than nasopharyngeal swab, presence of viral mutation(s) within  the areas targeted by this assay, and inadequate number of viral copies(<138 copies/mL). A negative result must be combined with clinical observations, patient history, and epidemiological information. The expected result is Negative.  Fact Sheet for Patients:  BloggerCourse.com  Fact Sheet for Healthcare Providers:  SeriousBroker.it  This test is no t yet approved or cleared by the [place]  [place] FDA and  has been authorized for detection and/or diagnosis of SARS-CoV-2 by FDA under an Emergency Use Authorization (EUA). This EUA will remain  in effect (meaning this test can be used) for the duration of the COVID-19 declaration under Section 564(b)(1) of the Act, 21 U.S.C.section 360bbb-3(b)(1), unless the authorization is terminated  or revoked sooner.       Influenza A by PCR NEGATIVE NEGATIVE Final   Influenza B by PCR NEGATIVE NEGATIVE Final    Comment: (NOTE) The Xpert Xpress SARS-CoV-2/FLU/RSV plus assay is intended as an aid in the diagnosis of influenza from Nasopharyngeal swab specimens and should not be used as a sole basis for treatment. Nasal washings and aspirates are unacceptable for Xpert Xpress SARS-CoV-2/FLU/RSV testing.  Fact Sheet for Patients: BloggerCourse.com  Fact Sheet for Healthcare Providers: SeriousBroker.it  This test is not yet approved or cleared by the [place]  FDA and has been authorized for detection and/or diagnosis of SARS-CoV-2 by FDA under an Emergency Use Authorization (EUA). This EUA will remain in effect (meaning this test can be used) for the duration of the COVID-19 declaration under Section 564(b)(1) of the Act, 21 U.S.C. section 360bbb-3(b)(1), unless the authorization is terminated or revoked.     Resp Syncytial Virus by PCR NEGATIVE NEGATIVE Final    Comment: (NOTE) Fact Sheet for  Patients: BloggerCourse.com  Fact Sheet for Healthcare Providers: SeriousBroker.it  This test is not yet approved or cleared by the [place]  FDA and has been authorized for detection and/or diagnosis of SARS-CoV-2 by FDA under an Emergency Use Authorization (EUA). This EUA will remain in effect (meaning this test can be used) for the duration of the COVID-19 declaration under Section 564(b)(1) of the Act, 21 U.S.C. section 360bbb-3(b)(1), unless the authorization is terminated or revoked.  Performed at [facility], [address], [place], [place] [address]      Time coordinating discharge: Over 30 minutes  SIGNED:   Darcel Dawley, MD  [facility] 09/06/2023, 1:49 PM Pager   If 7PM-7AM, please contact night-coverage

## 2023-09-06 NOTE — Plan of Care (Signed)

## 2023-09-08 ENCOUNTER — Other Ambulatory Visit (HOSPITAL_COMMUNITY): Payer: Self-pay

## 2023-11-21 ENCOUNTER — Telehealth: Payer: Self-pay

## 2023-11-21 NOTE — Telephone Encounter (Signed)
 Received CMN from [facility] for POC.  CMN has been signed by RB and successfully faxed back to [phone]

## 2023-11-26 ENCOUNTER — Telehealth: Payer: Self-pay

## 2023-11-26 NOTE — Telephone Encounter (Signed)
 CMN for oxygen signed by Dr Shelah and faxed to [facility] confirmation recevied
